# Patient Record
Sex: Female | Born: 1980
Health system: Southern US, Community
[De-identification: ages and names within clinical notes are randomized; demographics above are authoritative.]

## PROBLEM LIST (undated history)

## (undated) DIAGNOSIS — E039 Hypothyroidism, unspecified: Secondary | ICD-10-CM

## (undated) DIAGNOSIS — C801 Malignant (primary) neoplasm, unspecified: Secondary | ICD-10-CM

## (undated) DIAGNOSIS — R51 Headache: Secondary | ICD-10-CM

## (undated) DIAGNOSIS — F419 Anxiety disorder, unspecified: Secondary | ICD-10-CM

## (undated) DIAGNOSIS — Z9889 Other specified postprocedural states: Secondary | ICD-10-CM

## (undated) DIAGNOSIS — Z923 Personal history of irradiation: Secondary | ICD-10-CM

## (undated) DIAGNOSIS — Z8619 Personal history of other infectious and parasitic diseases: Secondary | ICD-10-CM

## (undated) DIAGNOSIS — R112 Nausea with vomiting, unspecified: Secondary | ICD-10-CM

## (undated) DIAGNOSIS — K219 Gastro-esophageal reflux disease without esophagitis: Secondary | ICD-10-CM

## (undated) HISTORY — DX: Personal history of other infectious and parasitic diseases: Z86.19

## (undated) HISTORY — PX: DILATION AND CURETTAGE OF UTERUS: SHX78

## (undated) HISTORY — PX: THYROIDECTOMY: SHX17

---

## 2006-10-11 DIAGNOSIS — C801 Malignant (primary) neoplasm, unspecified: Secondary | ICD-10-CM

## 2006-10-11 HISTORY — DX: Malignant (primary) neoplasm, unspecified: C80.1

## 2006-11-17 ENCOUNTER — Encounter: Admission: RE | Admit: 2006-11-17 | Discharge: 2006-11-17 | Payer: Self-pay | Admitting: Family Medicine

## 2006-11-29 ENCOUNTER — Other Ambulatory Visit: Admission: RE | Admit: 2006-11-29 | Discharge: 2006-11-29 | Payer: Self-pay | Admitting: Interventional Radiology

## 2006-11-29 ENCOUNTER — Encounter: Admission: RE | Admit: 2006-11-29 | Discharge: 2006-11-29 | Payer: Self-pay | Admitting: Family Medicine

## 2006-11-29 ENCOUNTER — Encounter (INDEPENDENT_AMBULATORY_CARE_PROVIDER_SITE_OTHER): Payer: Self-pay | Admitting: *Deleted

## 2006-12-16 ENCOUNTER — Ambulatory Visit (HOSPITAL_COMMUNITY): Admission: RE | Admit: 2006-12-16 | Discharge: 2006-12-17 | Payer: Self-pay | Admitting: Surgery

## 2006-12-16 ENCOUNTER — Encounter (INDEPENDENT_AMBULATORY_CARE_PROVIDER_SITE_OTHER): Payer: Self-pay | Admitting: Specialist

## 2007-01-06 ENCOUNTER — Encounter: Admission: RE | Admit: 2007-01-06 | Discharge: 2007-01-06 | Payer: Self-pay | Admitting: Endocrinology

## 2007-01-13 ENCOUNTER — Encounter: Admission: RE | Admit: 2007-01-13 | Discharge: 2007-01-13 | Payer: Self-pay | Admitting: Endocrinology

## 2007-01-20 ENCOUNTER — Encounter: Admission: RE | Admit: 2007-01-20 | Discharge: 2007-01-20 | Payer: Self-pay | Admitting: Endocrinology

## 2007-11-30 IMAGING — US US BIOPSY
1 series · 7 of 7 positions shown · non-contrast
Comparison: none

CLINICAL DATA: Dominant left asymptomatic thyroid nodule

ULTRASOUND GUIDED ASPIRATION BIOPSY THYROID:
TECHNIQUE: Overlying skin prepped with Betadine, draped in usual sterile
fashion, and infiltrated locally with 1% lidocaine. Under real-time ultrasound
guidance, 4 passes were made into the dominant solid left lower pole nodule
using 25 gauge needles. Samples were given to cytopathology. No immediate
complication.

[Series 1: us biopsy · 7 acquisitions, 7 frames shown]
[im 1/7]
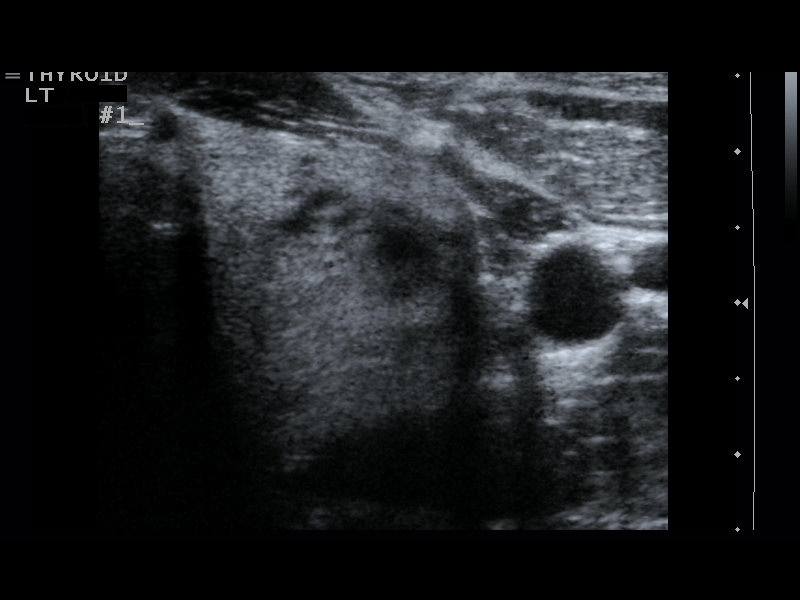
[im 2/7]
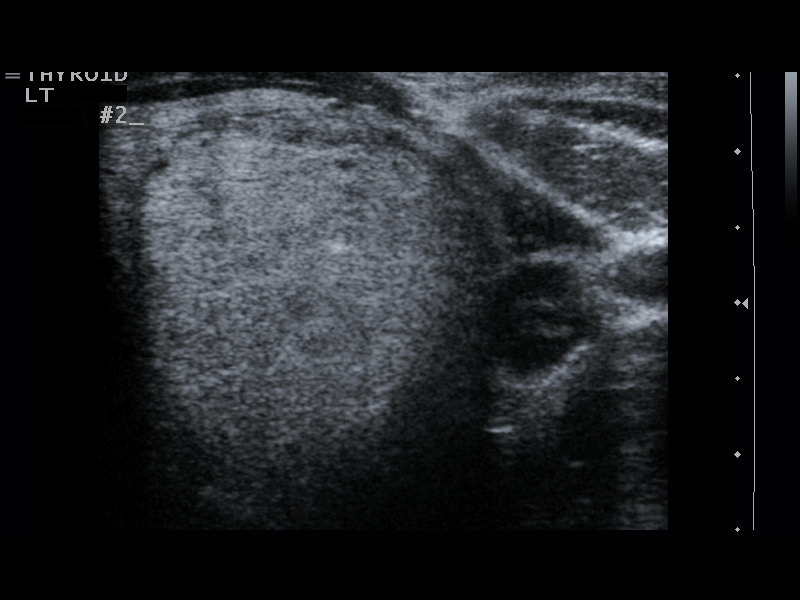
[im 3/7]
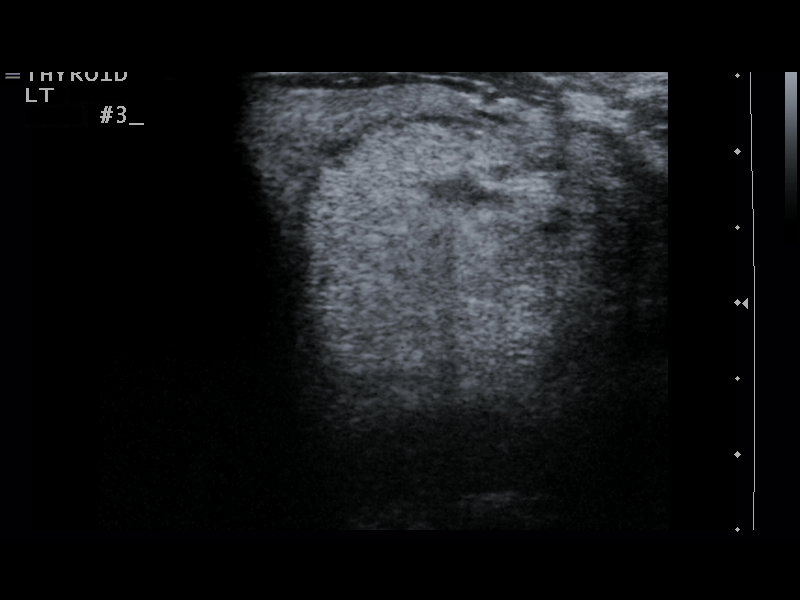
[im 4/7]
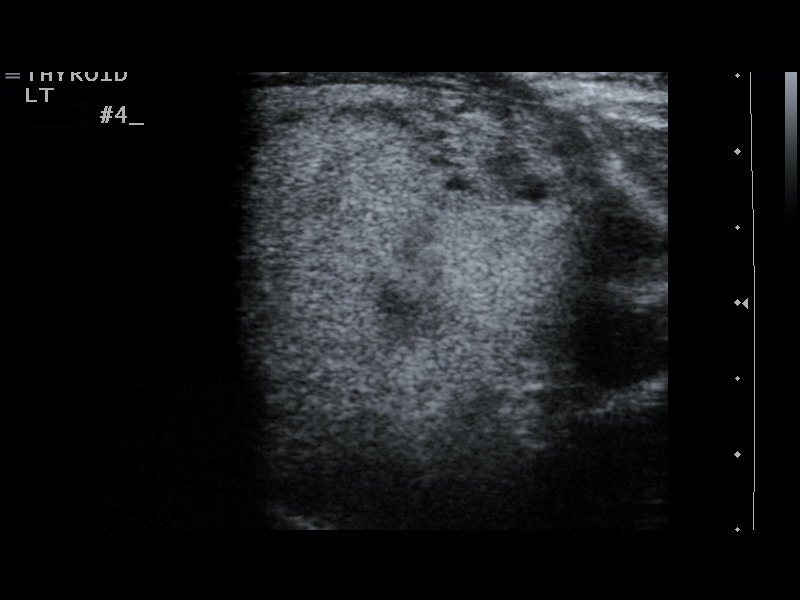
[im 5/7]
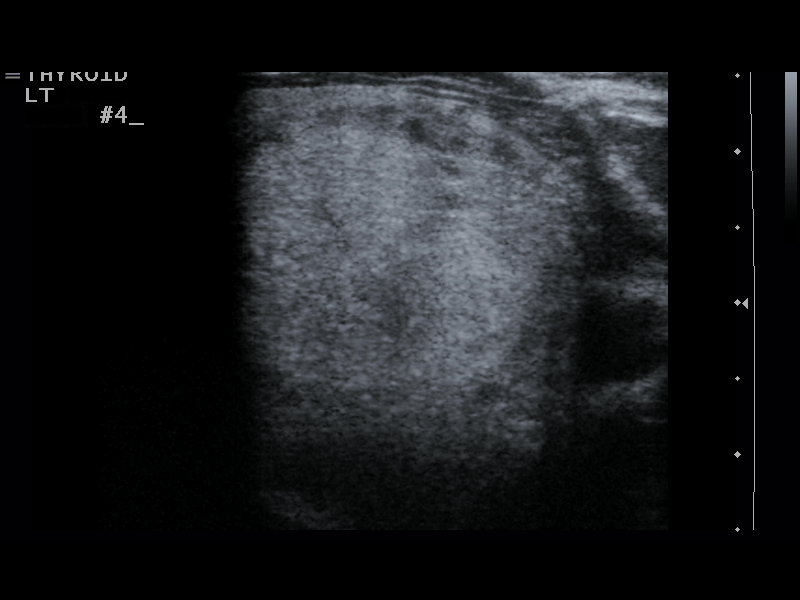
[im 6/7]
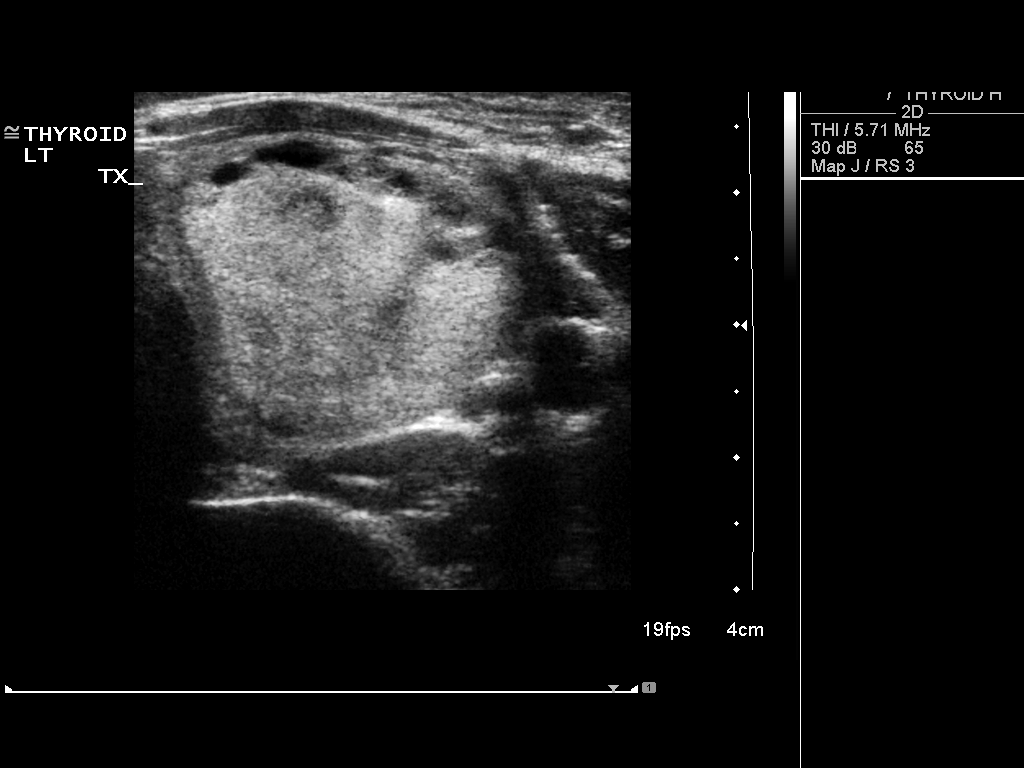
[im 7/7]
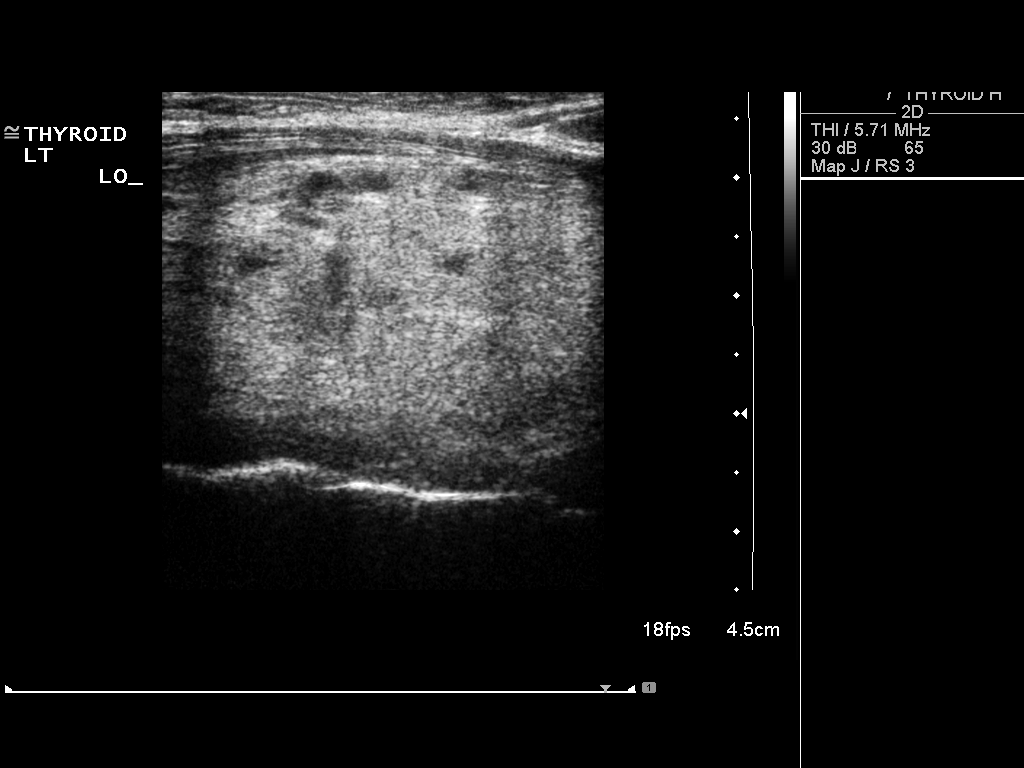

[7 of 7 positions shown; findings below may reference images not displayed]

IMPRESSION: Technically successful ultrasound guided FNA thyroid biopsy.

## 2007-12-15 IMAGING — CR DG CHEST 2V
2 series · 2 of 2 positions shown · non-contrast
Comparison: None.

CLINICAL DATA: 26 year-old-female with papillary thyroid cancer.  Preoperative respiratory exam. 
 CHEST - 2 VIEW:

[view not recorded (1 of 2)]
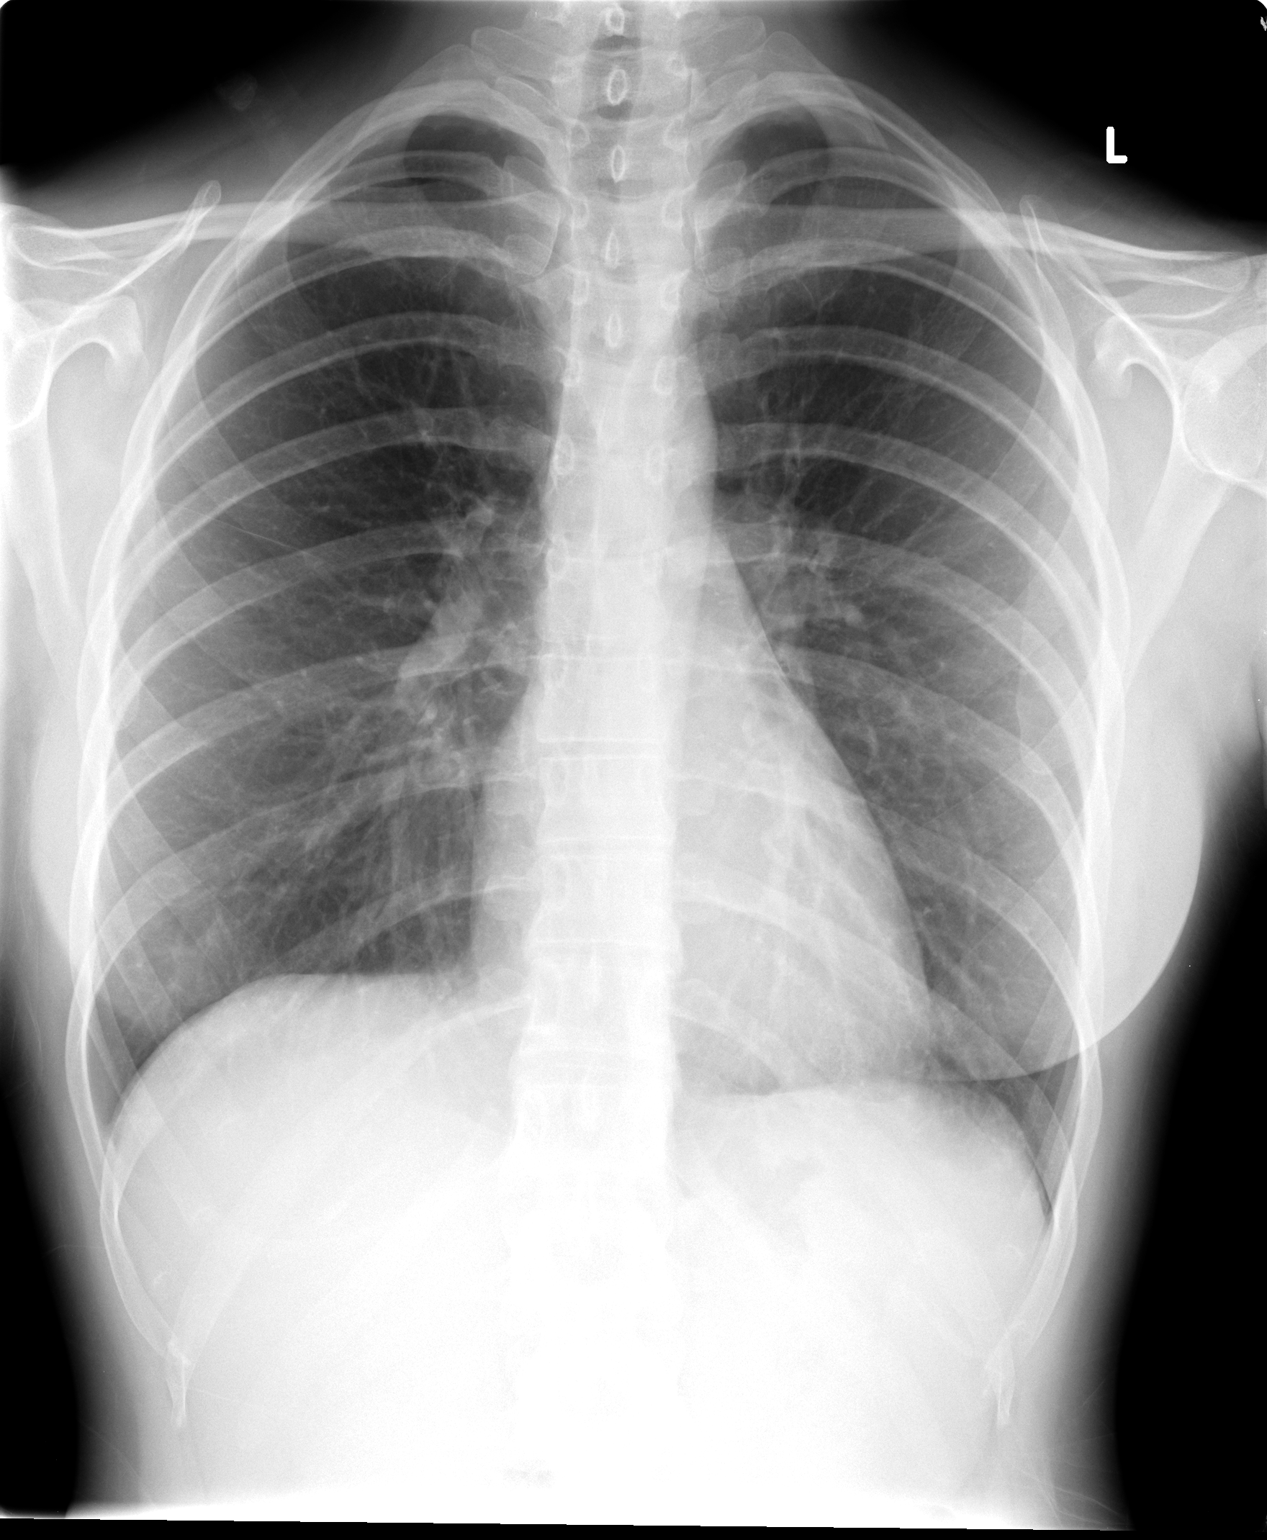

[view not recorded (2 of 2)]
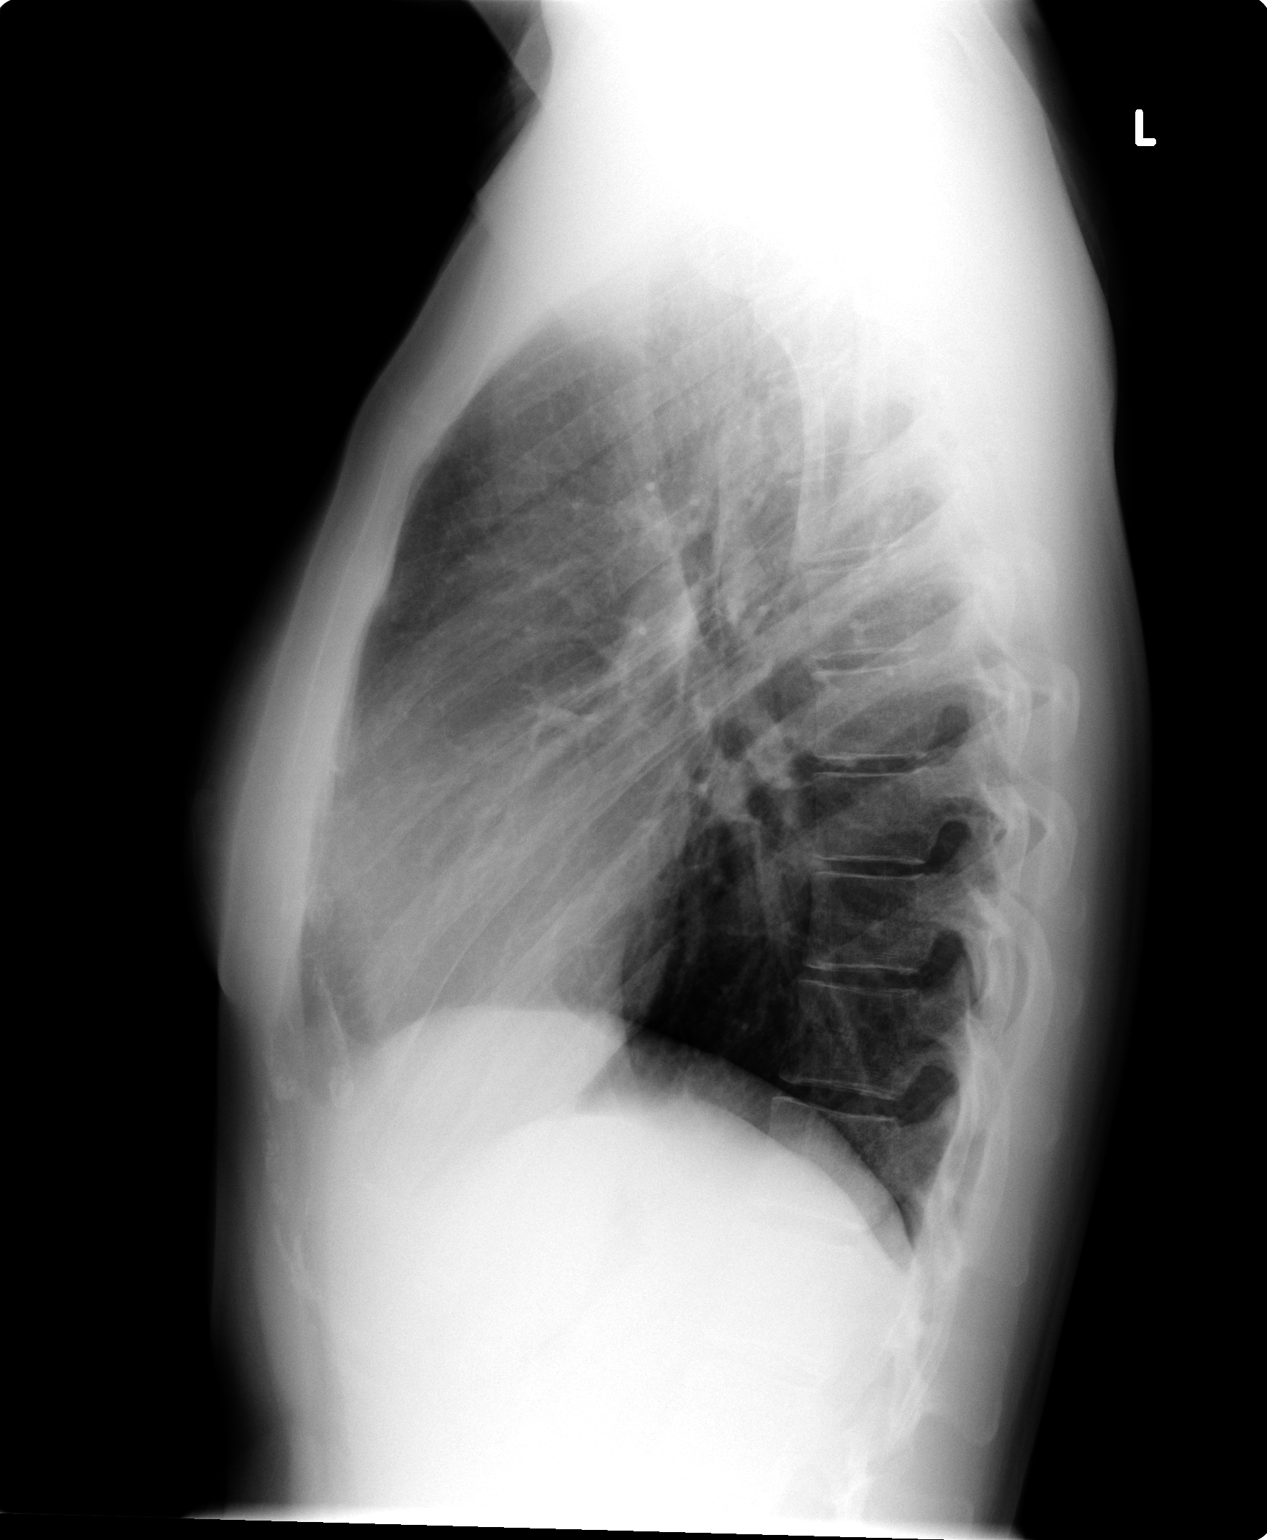

[2 of 2 positions shown; findings below may reference images not displayed]

FINDINGS: The heart size and mediastinal contours are within normal limits.  Both lungs are clear.  The visualized skeletal structures are unremarkable.
IMPRESSION: No active cardiopulmonary disease.

## 2008-05-31 ENCOUNTER — Ambulatory Visit (HOSPITAL_COMMUNITY): Admission: RE | Admit: 2008-05-31 | Discharge: 2008-05-31 | Payer: Self-pay | Admitting: Obstetrics and Gynecology

## 2008-06-21 ENCOUNTER — Ambulatory Visit (HOSPITAL_COMMUNITY): Admission: RE | Admit: 2008-06-21 | Discharge: 2008-06-21 | Payer: Self-pay | Admitting: Obstetrics and Gynecology

## 2008-08-26 ENCOUNTER — Ambulatory Visit (HOSPITAL_COMMUNITY): Admission: RE | Admit: 2008-08-26 | Discharge: 2008-08-26 | Payer: Self-pay | Admitting: Obstetrics and Gynecology

## 2008-10-22 ENCOUNTER — Inpatient Hospital Stay (HOSPITAL_COMMUNITY): Admission: AD | Admit: 2008-10-22 | Discharge: 2008-10-25 | Payer: Self-pay | Admitting: Obstetrics and Gynecology

## 2008-10-23 ENCOUNTER — Encounter (INDEPENDENT_AMBULATORY_CARE_PROVIDER_SITE_OTHER): Payer: Self-pay | Admitting: Obstetrics and Gynecology

## 2008-10-28 ENCOUNTER — Encounter: Admission: RE | Admit: 2008-10-28 | Discharge: 2008-11-27 | Payer: Self-pay | Admitting: Obstetrics and Gynecology

## 2008-11-28 ENCOUNTER — Encounter: Admission: RE | Admit: 2008-11-28 | Discharge: 2008-12-25 | Payer: Self-pay | Admitting: Obstetrics and Gynecology

## 2008-12-26 ENCOUNTER — Encounter: Admission: RE | Admit: 2008-12-26 | Discharge: 2009-01-25 | Payer: Self-pay | Admitting: Obstetrics and Gynecology

## 2009-01-26 ENCOUNTER — Encounter: Admission: RE | Admit: 2009-01-26 | Discharge: 2009-02-24 | Payer: Self-pay | Admitting: Obstetrics and Gynecology

## 2009-02-25 ENCOUNTER — Encounter: Admission: RE | Admit: 2009-02-25 | Discharge: 2009-03-27 | Payer: Self-pay | Admitting: Obstetrics and Gynecology

## 2009-03-28 ENCOUNTER — Encounter: Admission: RE | Admit: 2009-03-28 | Discharge: 2009-04-17 | Payer: Self-pay | Admitting: Obstetrics and Gynecology

## 2009-05-19 ENCOUNTER — Encounter (HOSPITAL_COMMUNITY): Admission: RE | Admit: 2009-05-19 | Discharge: 2009-07-15 | Payer: Self-pay | Admitting: Endocrinology

## 2010-11-02 ENCOUNTER — Encounter (HOSPITAL_COMMUNITY): Payer: Self-pay | Admitting: Obstetrics and Gynecology

## 2010-11-11 ENCOUNTER — Other Ambulatory Visit (HOSPITAL_COMMUNITY): Payer: Self-pay | Admitting: Endocrinology

## 2010-11-11 DIAGNOSIS — C73 Malignant neoplasm of thyroid gland: Secondary | ICD-10-CM

## 2010-11-30 ENCOUNTER — Other Ambulatory Visit (HOSPITAL_COMMUNITY): Payer: Self-pay | Admitting: Endocrinology

## 2010-11-30 ENCOUNTER — Inpatient Hospital Stay (HOSPITAL_COMMUNITY): Admission: RE | Admit: 2010-11-30 | Payer: Self-pay | Source: Ambulatory Visit

## 2010-11-30 DIAGNOSIS — Z85850 Personal history of malignant neoplasm of thyroid: Secondary | ICD-10-CM

## 2010-12-01 ENCOUNTER — Ambulatory Visit (HOSPITAL_COMMUNITY): Payer: Self-pay

## 2010-12-02 ENCOUNTER — Ambulatory Visit (HOSPITAL_COMMUNITY): Payer: Self-pay

## 2010-12-04 ENCOUNTER — Encounter (HOSPITAL_COMMUNITY): Payer: Self-pay

## 2010-12-28 ENCOUNTER — Encounter (HOSPITAL_COMMUNITY)
Admission: RE | Admit: 2010-12-28 | Discharge: 2010-12-28 | Disposition: A | Discharge status: Home / Self Care | Payer: 59 | Source: Ambulatory Visit | Attending: Endocrinology | Admitting: Endocrinology

## 2010-12-28 ENCOUNTER — Ambulatory Visit (HOSPITAL_COMMUNITY): Payer: Self-pay

## 2010-12-28 DIAGNOSIS — C73 Malignant neoplasm of thyroid gland: Secondary | ICD-10-CM | POA: Insufficient documentation

## 2010-12-28 DIAGNOSIS — Z85850 Personal history of malignant neoplasm of thyroid: Secondary | ICD-10-CM

## 2010-12-29 ENCOUNTER — Ambulatory Visit (HOSPITAL_COMMUNITY): Payer: 59 | Attending: Endocrinology

## 2010-12-29 ENCOUNTER — Ambulatory Visit (HOSPITAL_COMMUNITY): Payer: Self-pay

## 2010-12-30 ENCOUNTER — Ambulatory Visit (HOSPITAL_COMMUNITY): Payer: 59 | Attending: Endocrinology

## 2010-12-30 ENCOUNTER — Ambulatory Visit (HOSPITAL_COMMUNITY): Payer: Self-pay

## 2010-12-30 LAB — HCG, SERUM, QUALITATIVE: Preg, Serum: NEGATIVE

## 2011-01-01 ENCOUNTER — Encounter (HOSPITAL_COMMUNITY)
Admission: RE | Admit: 2011-01-01 | Discharge: 2011-01-01 | Disposition: A | Discharge status: Home / Self Care | Payer: 59 | Source: Ambulatory Visit | Attending: Endocrinology | Admitting: Endocrinology

## 2011-01-01 ENCOUNTER — Encounter (HOSPITAL_COMMUNITY): Payer: Self-pay

## 2011-01-01 DIAGNOSIS — C73 Malignant neoplasm of thyroid gland: Secondary | ICD-10-CM | POA: Insufficient documentation

## 2011-01-01 MED ORDER — SODIUM IODIDE I 131 CAPSULE
4.0000 | Freq: Once | INTRAVENOUS | Status: AC | PRN
  Administered 2011-01-01: 4 via ORAL

## 2011-01-25 LAB — CBC
HCT: 36.3 % (ref 36.0–46.0)
Hemoglobin: 12.1 g/dL (ref 12.0–15.0)
MCHC: 33.4 g/dL (ref 30.0–36.0)
MCHC: 34 g/dL (ref 30.0–36.0)
MCV: 91.6 fL (ref 78.0–100.0)
MCV: 92.5 fL (ref 78.0–100.0)
Platelets: 153 10*3/uL (ref 150–400)
RBC: 3.34 MIL/uL — ABNORMAL LOW (ref 3.87–5.11)
RBC: 3.96 MIL/uL (ref 3.87–5.11)
RDW: 13.3 % (ref 11.5–15.5)
RDW: 13.3 % (ref 11.5–15.5)

## 2011-02-23 NOTE — Discharge Summary (Signed)
NAMEJERMIA, Phyllis Harris NO.:  [id number]   MEDICAL RECORD NO.:  [id number]          PATIENT TYPE:  INP   LOCATION:  9121                          FACILITY:  [facility]   PHYSICIAN:  Dineen Kid. Rana Snare, M.D.    DATE OF BIRTH:  1980-10-24   DATE OF ADMISSION:  10/22/2008  DATE OF DISCHARGE:  10/25/2008                               DISCHARGE SUMMARY   ADMITTING DIAGNOSES:  1. Intrauterine pregnancy at 37 weeks, estimated gestational age.  2. Large for gestational age, baby.  3. Spontaneous rupture of membranes.   DISCHARGE DIAGNOSES:  1. Status post low-transverse cesarean section.  2. Viable female infant.   PROCEDURE:  Low-transverse cesarean section.   REASON FOR ADMISSION:  Please see written H&P.   HOSPITAL COURSE:  The patient is 30 year old white married female  primigravida was admitted to [facility] at 37 weeks  estimated gestational age with spontaneous rupture of membranes.  The  patient had had an ultrasound in the office 2 days prior to admission  with an estimated fetal weight of 11 pounds 4 ounces on the ultrasound.  The patient had undergone a 3-hour glucose tolerance test which was  within normal limits.  Due to estimated fetal weight of greater than  4000 g, decision was made to proceed with a primary low-transverse  cesarean section.  The patient was then transferred to the operating  room, where spinal anesthesia was administered without difficulty.  Low  transverse incision was made with delivery of a viable female infant  weighing 10 pounds 10 ounces, Apgars of 8 at 1 minute and 9 at 5  minutes.  The patient tolerated the procedure well and was taken to the  recovery room in stable condition.  Later that morning, the patient was  transferred to mother baby unit where she was without complaint.  Vital  signs were stable.  She is afebrile.  Abdomen is soft with good return  of bowel function.  Fundus was firm and nontender.  Abdominal  dressings  noted to be clean, dry, and intact.  Foley was draining with adequate  amount of urine output.  On postoperative day #1, the patient was  without complaint.  Vital signs were stable.  She was afebrile.  Fundus  was firm and nontender.  Abdominal dressing was clean, dry, and intact.  The patient's Foley has been discontinued and she was voiding well.   LABORATORY FINDINGS:  Revealed hemoglobin of 10.5, platelet count of  137,000, WBC count of 12.8.  Blood type was known to be B+.  On  postoperative day #2, the patient did desire early discharge.  Fundus  was firm and nontender.  Incision was clean, dry, and intact.  The  staples removed and the patient was later discharged home.   CONDITION ON DISCHARGE:  Good.   DIET:  Regular as tolerated.   ACTIVITY:  No heavy lifting, no driving x2 weeks, no vaginal entry.   FOLLOW UP:  The patient to follow up in the office in 1-2 weeks for  incision check.  She is to call for temperature greater  than 100 degrees  persistent nausea, vomiting, heavy vaginal bleeding, and/or redness, or  drainage from the incisional site.   DISCHARGE MEDICATIONS:  1. Tylox #30 1 p.o. every 4-6 hours p.r.n.  2. Motrin 600 mg every 6 hours.  3. Prenatal vitamins 1 p.o. daily.  4. Colace 1 p.o. daily p.r.n.      Julio Sicks, N.P.      Dineen Kid Rana Snare, M.D.  Electronically Signed    CC/MEDQ  D:  11/18/2008  T:  11/18/2008  Job:  [id number]

## 2011-02-23 NOTE — Op Note (Signed)
[name], Harris NO.:  [id number]   MEDICAL RECORD NO.:  [id number]          PATIENT TYPE:  INP   LOCATION:  9121                          FACILITY:  [facility]   PHYSICIAN:  Guy Sandifer. Henderson Cloud, M.D. DATE OF BIRTH:  03/08/1981   DATE OF PROCEDURE:  10/23/2008  DATE OF DISCHARGE:  08/26/2008                               OPERATIVE REPORT   PREOPERATIVE DIAGNOSES:  1. Intrauterine pregnancy at 21 and 0/7th weeks.  2. Large for gestational age baby.  3. Spontaneous rupture of membranes.   POSTOPERATIVE DIAGNOSES:  1. Intrauterine pregnancy at 55 and 0/7th weeks.  2. Large for gestational age baby.  3. Spontaneous rupture of membranes.   PROCEDURE:  Low-transverse cesarean section.   SUBJECTIVE:  Guy Sandifer. Henderson Cloud, MD   ANESTHESIA:  Spinal, Tyrone Apple. Malen Gauze, MD   ESTIMATED BLOOD LOSS:  500 mL.   SPECIMENS:  Placenta to Pathology.   FINDINGS:  Viable female infant, Apgars of 8 and 9 at one and five  minutes respectively, birth weight 10 pounds 10 ounces.   INDICATIONS AND CONSENT:  This patient is a 30 year old married white  female G1, P0 at 83 and 0/7th weeks who 2 days ago had an estimated  fetal weight of approximately 11 pounds 4 ounces on ultrasound.  Her 3-  hour GTT was normal.  Today, she has spontaneous rupture of membranes  with clear fluid.  Cesarean section was recommended.  Potential risks  and complications were discussed preoperatively including, but not  limited to infection, organ damage, bleeding requiring transfusion of  blood products with HIV and hepatitis acquisition, DVT, PE, pneumonia.  All questions were answered, and consent was signed on the chart.   PROCEDURE:  The patient was taken to the operating room, where she was  identified, spinal anesthetic was placed, and she was placed in a dorsal  supine position with a 15-degree left lateral wedge.  She was prepped,  Foley catheter was placed, the bladder was drained, and she was  draped  in sterile fashion.  After testing for adequate spinal anesthesia, skin  was entered through a Pfannenstiel incision, and dissection was carried  out in the layers to the peritoneum.  Peritoneum was incised, extended  superiorly and inferiorly.  Vesicouterine peritoneum was taken down  cephalad laterally.  The bladder flap was developed and the bladder  blade was placed.  The uterus was incised in a low-transverse manner.  The uterine cavity was entered bluntly.  The uterine incision was  extended cephalad laterally with fingers.  The vertex was then delivered  and the oral and nasopharynx were suctioned.  The remainder of the baby  was delivered.  Good cry and tone was noted.  Cord was clamped and cut.  The baby was handed to the awaiting pediatrics team.  The placenta was  manually delivered and sent to Pathology.  Uterine cavity was cleaned.  Uterus was closed in 2 running locking imbricating layers of 0 Monocryl,  which achieved good hemostasis.  Tubes and ovaries were normal  bilaterally.  Irrigation was carried out.  Anterior peritoneum was  closed with running fashion with 0 Monocryl, which was also used to  reapproximate the pyramidalis muscles in the midline.  Anterior rectus  fascia was closed in running fashion with the double loop 0 PDS suture.  Skin is closed with clips.  All sponge, instruments, and needle counts  were correct, and the patient was transferred to the recovery room in  stable condition.      Guy Sandifer Henderson Cloud, M.D.  Electronically Signed     JET/MEDQ  D:  10/23/2008  T:  10/23/2008  Job:  [id number]

## 2013-04-19 ENCOUNTER — Inpatient Hospital Stay (HOSPITAL_COMMUNITY): Admission: AD | Admit: 2013-04-19 | Payer: 59 | Source: Ambulatory Visit | Admitting: Obstetrics and Gynecology

## 2013-05-04 ENCOUNTER — Other Ambulatory Visit: Payer: Self-pay | Admitting: Obstetrics and Gynecology

## 2013-10-23 LAB — OB RESULTS CONSOLE ABO/RH: RH TYPE: POSITIVE

## 2013-10-23 LAB — OB RESULTS CONSOLE HEPATITIS B SURFACE ANTIGEN: Hepatitis B Surface Ag: NEGATIVE

## 2013-10-23 LAB — OB RESULTS CONSOLE ANTIBODY SCREEN: ANTIBODY SCREEN: NEGATIVE

## 2013-10-23 LAB — OB RESULTS CONSOLE RUBELLA ANTIBODY, IGM: RUBELLA: IMMUNE

## 2013-10-23 LAB — OB RESULTS CONSOLE GC/CHLAMYDIA
Chlamydia: NEGATIVE
Gonorrhea: NEGATIVE

## 2013-10-23 LAB — OB RESULTS CONSOLE HIV ANTIBODY (ROUTINE TESTING): HIV: NONREACTIVE

## 2013-10-23 LAB — OB RESULTS CONSOLE RPR: RPR: NONREACTIVE

## 2013-11-19 ENCOUNTER — Other Ambulatory Visit (HOSPITAL_COMMUNITY): Payer: Self-pay | Admitting: Obstetrics and Gynecology

## 2013-11-20 ENCOUNTER — Other Ambulatory Visit (HOSPITAL_COMMUNITY): Payer: Self-pay | Admitting: Obstetrics and Gynecology

## 2013-11-20 ENCOUNTER — Encounter (HOSPITAL_COMMUNITY): Payer: Self-pay | Admitting: Obstetrics and Gynecology

## 2013-11-20 DIAGNOSIS — Z3689 Encounter for other specified antenatal screening: Secondary | ICD-10-CM

## 2013-11-20 DIAGNOSIS — O09529 Supervision of elderly multigravida, unspecified trimester: Secondary | ICD-10-CM

## 2013-11-20 DIAGNOSIS — O280 Abnormal hematological finding on antenatal screening of mother: Secondary | ICD-10-CM

## 2013-11-28 ENCOUNTER — Ambulatory Visit (HOSPITAL_COMMUNITY): Payer: 59

## 2013-12-04 ENCOUNTER — Ambulatory Visit (HOSPITAL_COMMUNITY): Payer: 59

## 2013-12-04 ENCOUNTER — Encounter (HOSPITAL_COMMUNITY): Payer: 59

## 2013-12-07 ENCOUNTER — Other Ambulatory Visit (HOSPITAL_COMMUNITY): Payer: 59

## 2014-04-30 ENCOUNTER — Encounter (HOSPITAL_COMMUNITY): Payer: Self-pay | Admitting: Pharmacy Technician

## 2014-05-10 ENCOUNTER — Encounter (HOSPITAL_COMMUNITY): Payer: Self-pay

## 2014-05-10 NOTE — Patient Instructions (Signed)
Your procedure is scheduled on: Wednesday, May 15, 2014  Enter through the [employer] [facility] at: 11:30 AM  Pick up the phone at the desk and dial [phone].  Call this number if you have problems the morning of surgery: [phone].  Remember: Do NOT eat food: AFTER MIDNIGHT TUESDAY Do NOT drink clear liquids after: AFTER 9:00 AM Wednesday MORNING Take these medicines the morning of surgery with a SIP OF WATER:  SYNTHROID, ZANTAC  Do NOT wear jewelry (body piercing), metal hair clips/bobby pins, make-up, or nail polish. Do NOT wear lotions, powders, or perfumes.  You may wear deoderant. Do NOT shave for 48 hours prior to surgery. Do NOT bring valuables to the hospital. Contacts, dentures, or bridgework may not be worn into surgery. Leave suitcase in car.  After surgery it may be brought to your room.  For patients admitted to the hospital, checkout time is 11:00 AM the day of discharge.

## 2014-05-13 ENCOUNTER — Encounter (HOSPITAL_COMMUNITY): Payer: Self-pay

## 2014-05-13 ENCOUNTER — Encounter (HOSPITAL_COMMUNITY)
Admission: RE | Admit: 2014-05-13 | Discharge: 2014-05-13 | Disposition: A | Discharge status: Home / Self Care | Payer: 59 | Source: Ambulatory Visit | Attending: Obstetrics and Gynecology | Admitting: Obstetrics and Gynecology

## 2014-05-13 HISTORY — DX: Gastro-esophageal reflux disease without esophagitis: K21.9

## 2014-05-13 HISTORY — DX: Hypothyroidism, unspecified: E03.9

## 2014-05-13 HISTORY — DX: Malignant (primary) neoplasm, unspecified: C80.1

## 2014-05-13 HISTORY — DX: Headache: R51

## 2014-05-13 LAB — CBC
HEMATOCRIT: 38 % (ref 36.0–46.0)
Hemoglobin: 13.1 g/dL (ref 12.0–15.0)
MCH: 30.5 pg (ref 26.0–34.0)
MCHC: 34.5 g/dL (ref 30.0–36.0)
MCV: 88.6 fL (ref 78.0–100.0)
PLATELETS: 136 10*3/uL — AB (ref 150–400)
RBC: 4.29 MIL/uL (ref 3.87–5.11)
RDW: 13.7 % (ref 11.5–15.5)
WBC: 11.4 10*3/uL — AB (ref 4.0–10.5)

## 2014-05-13 LAB — BASIC METABOLIC PANEL
ANION GAP: 13 (ref 5–15)
BUN: 9 mg/dL (ref 6–23)
CHLORIDE: 101 meq/L (ref 96–112)
CO2: 23 mEq/L (ref 19–32)
CREATININE: 0.63 mg/dL (ref 0.50–1.10)
Calcium: 9 mg/dL (ref 8.4–10.5)
GFR calc Af Amer: >90 mL/min (ref >90)
Glucose, Bld: 133 mg/dL — ABNORMAL HIGH (ref 70–99)
POTASSIUM: 5 meq/L (ref 3.7–5.3)
Sodium: 137 mEq/L (ref 137–147)

## 2014-05-13 LAB — ABO/RH: ABO/RH(D): B POS

## 2014-05-13 LAB — RPR

## 2014-05-14 ENCOUNTER — Inpatient Hospital Stay (HOSPITAL_COMMUNITY): Payer: 59 | Admitting: Anesthesiology

## 2014-05-14 ENCOUNTER — Encounter (HOSPITAL_COMMUNITY): Payer: 59 | Admitting: Anesthesiology

## 2014-05-14 ENCOUNTER — Inpatient Hospital Stay (HOSPITAL_COMMUNITY)
Admission: AD | Admit: 2014-05-14 | Discharge: 2014-05-16 | DRG: 766 | Disposition: A | Discharge status: Home / Self Care | Payer: 59 | Source: Ambulatory Visit | Attending: Obstetrics and Gynecology | Admitting: Obstetrics and Gynecology

## 2014-05-14 ENCOUNTER — Encounter (HOSPITAL_COMMUNITY): Payer: Self-pay | Admitting: *Deleted

## 2014-05-14 ENCOUNTER — Encounter (HOSPITAL_COMMUNITY)
Admission: AD | Disposition: A | Discharge status: Home / Self Care | Payer: Self-pay | Source: Ambulatory Visit | Attending: Obstetrics and Gynecology

## 2014-05-14 DIAGNOSIS — O99284 Endocrine, nutritional and metabolic diseases complicating childbirth: Secondary | ICD-10-CM

## 2014-05-14 DIAGNOSIS — O09529 Supervision of elderly multigravida, unspecified trimester: Secondary | ICD-10-CM | POA: Diagnosis present

## 2014-05-14 DIAGNOSIS — O34219 Maternal care for unspecified type scar from previous cesarean delivery: Principal | ICD-10-CM | POA: Diagnosis present

## 2014-05-14 DIAGNOSIS — E039 Hypothyroidism, unspecified: Secondary | ICD-10-CM | POA: Diagnosis present

## 2014-05-14 DIAGNOSIS — K219 Gastro-esophageal reflux disease without esophagitis: Secondary | ICD-10-CM | POA: Diagnosis present

## 2014-05-14 DIAGNOSIS — O3660X0 Maternal care for excessive fetal growth, unspecified trimester, not applicable or unspecified: Secondary | ICD-10-CM | POA: Diagnosis present

## 2014-05-14 DIAGNOSIS — E079 Disorder of thyroid, unspecified: Secondary | ICD-10-CM | POA: Diagnosis present

## 2014-05-14 DIAGNOSIS — Z98891 History of uterine scar from previous surgery: Secondary | ICD-10-CM

## 2014-05-14 DIAGNOSIS — O479 False labor, unspecified: Secondary | ICD-10-CM | POA: Diagnosis present

## 2014-05-14 LAB — CBC
HEMATOCRIT: 38.1 % (ref 36.0–46.0)
HEMOGLOBIN: 13 g/dL (ref 12.0–15.0)
MCH: 30.1 pg (ref 26.0–34.0)
MCHC: 34.1 g/dL (ref 30.0–36.0)
MCV: 88.2 fL (ref 78.0–100.0)
Platelets: 128 10*3/uL — ABNORMAL LOW (ref 150–400)
RBC: 4.32 MIL/uL (ref 3.87–5.11)
RDW: 13.8 % (ref 11.5–15.5)
WBC: 12.1 10*3/uL — AB (ref 4.0–10.5)

## 2014-05-14 LAB — PREPARE RBC (CROSSMATCH)

## 2014-05-14 SURGERY — Surgical Case
Anesthesia: Spinal

## 2014-05-14 MED ORDER — SIMETHICONE 80 MG PO CHEW
80.0000 mg | CHEWABLE_TABLET | ORAL | Status: DC
  Administered 2014-05-15 (×2): 80 mg via ORAL
  Filled 2014-05-14 (×2): qty 1

## 2014-05-14 MED ORDER — SIMETHICONE 80 MG PO CHEW
80.0000 mg | CHEWABLE_TABLET | Freq: Three times a day (TID) | ORAL | Status: DC
  Administered 2014-05-14 – 2014-05-16 (×5): 80 mg via ORAL
  Filled 2014-05-14 (×5): qty 1

## 2014-05-14 MED ORDER — MORPHINE SULFATE 0.5 MG/ML IJ SOLN
INTRAMUSCULAR | Status: AC
  Filled 2014-05-14: qty 10

## 2014-05-14 MED ORDER — ZOLPIDEM TARTRATE 5 MG PO TABS: 5.0000 mg | ORAL_TABLET | Freq: Every evening | ORAL | Status: DC | PRN

## 2014-05-14 MED ORDER — TETANUS-DIPHTH-ACELL PERTUSSIS 5-2.5-18.5 LF-MCG/0.5 IM SUSP: 0.5000 mL | Freq: Once | INTRAMUSCULAR | Status: DC

## 2014-05-14 MED ORDER — NALOXONE HCL 0.4 MG/ML IJ SOLN: 0.4000 mg | INTRAMUSCULAR | Status: DC | PRN

## 2014-05-14 MED ORDER — DEXTROSE 5 % IV SOLN
1.0000 ug/kg/h | INTRAVENOUS | Status: DC | PRN
  Filled 2014-05-14: qty 2

## 2014-05-14 MED ORDER — MEPERIDINE HCL 25 MG/ML IJ SOLN: 6.2500 mg | INTRAMUSCULAR | Status: DC | PRN

## 2014-05-14 MED ORDER — CITRIC ACID-SODIUM CITRATE 334-500 MG/5ML PO SOLN
30.0000 mL | Freq: Once | ORAL | Status: AC
  Administered 2014-05-14: 30 mL via ORAL
  Filled 2014-05-14: qty 15

## 2014-05-14 MED ORDER — LACTATED RINGERS IV SOLN: INTRAVENOUS | Status: DC

## 2014-05-14 MED ORDER — MENTHOL 3 MG MT LOZG: 1.0000 | LOZENGE | OROMUCOSAL | Status: DC | PRN

## 2014-05-14 MED ORDER — METOCLOPRAMIDE HCL 5 MG/ML IJ SOLN: 10.0000 mg | Freq: Three times a day (TID) | INTRAMUSCULAR | Status: DC | PRN

## 2014-05-14 MED ORDER — IBUPROFEN 600 MG PO TABS: 600.0000 mg | ORAL_TABLET | Freq: Four times a day (QID) | ORAL | Status: DC | PRN

## 2014-05-14 MED ORDER — LANOLIN HYDROUS EX OINT: 1.0000 "application " | TOPICAL_OINTMENT | CUTANEOUS | Status: DC | PRN

## 2014-05-14 MED ORDER — LEVOTHYROXINE SODIUM 200 MCG PO TABS
200.0000 ug | ORAL_TABLET | Freq: Every day | ORAL | Status: DC
  Administered 2014-05-16: 200 ug via ORAL
  Filled 2014-05-14 (×2): qty 1

## 2014-05-14 MED ORDER — DIBUCAINE 1 % RE OINT: 1.0000 "application " | TOPICAL_OINTMENT | RECTAL | Status: DC | PRN

## 2014-05-14 MED ORDER — DIPHENHYDRAMINE HCL 50 MG/ML IJ SOLN: 25.0000 mg | INTRAMUSCULAR | Status: DC | PRN

## 2014-05-14 MED ORDER — DIPHENHYDRAMINE HCL 25 MG PO CAPS: 25.0000 mg | ORAL_CAPSULE | Freq: Four times a day (QID) | ORAL | Status: DC | PRN

## 2014-05-14 MED ORDER — ONDANSETRON HCL 4 MG PO TABS: 4.0000 mg | ORAL_TABLET | ORAL | Status: DC | PRN

## 2014-05-14 MED ORDER — DEXTROSE 5 % IV SOLN: 3.0000 g | INTRAVENOUS | Status: DC

## 2014-05-14 MED ORDER — PHENYLEPHRINE 8 MG IN D5W 100 ML (0.08MG/ML) PREMIX OPTIME
INJECTION | INTRAVENOUS | Status: AC
  Filled 2014-05-14: qty 100

## 2014-05-14 MED ORDER — CEFAZOLIN SODIUM-DEXTROSE 2-3 GM-% IV SOLR
2.0000 g | INTRAVENOUS | Status: DC
  Filled 2014-05-14: qty 50

## 2014-05-14 MED ORDER — MORPHINE SULFATE (PF) 0.5 MG/ML IJ SOLN
INTRAMUSCULAR | Status: DC | PRN
  Administered 2014-05-14: .15 mg via INTRATHECAL

## 2014-05-14 MED ORDER — PRENATAL MULTIVITAMIN CH
1.0000 | ORAL_TABLET | Freq: Every day | ORAL | Status: DC
  Administered 2014-05-15: 1 via ORAL
  Filled 2014-05-14 (×2): qty 1

## 2014-05-14 MED ORDER — ONDANSETRON HCL 4 MG/2ML IJ SOLN
INTRAMUSCULAR | Status: DC | PRN
  Administered 2014-05-14: 4 mg via INTRAVENOUS

## 2014-05-14 MED ORDER — OXYTOCIN 40 UNITS IN LACTATED RINGERS INFUSION - SIMPLE MED: 62.5000 mL/h | INTRAVENOUS | Status: AC

## 2014-05-14 MED ORDER — MIDAZOLAM HCL 2 MG/2ML IJ SOLN: 0.5000 mg | Freq: Once | INTRAMUSCULAR | Status: DC | PRN

## 2014-05-14 MED ORDER — LACTATED RINGERS IV SOLN
INTRAVENOUS | Status: DC
  Administered 2014-05-14: 09:00:00 via INTRAVENOUS

## 2014-05-14 MED ORDER — BUPIVACAINE IN DEXTROSE 0.75-8.25 % IT SOLN
INTRATHECAL | Status: DC | PRN
  Administered 2014-05-14: 1.6 mL via INTRATHECAL

## 2014-05-14 MED ORDER — NALBUPHINE HCL 10 MG/ML IJ SOLN: 5.0000 mg | INTRAMUSCULAR | Status: DC | PRN

## 2014-05-14 MED ORDER — FAMOTIDINE IN NACL 20-0.9 MG/50ML-% IV SOLN
20.0000 mg | Freq: Once | INTRAVENOUS | Status: AC
  Administered 2014-05-14: 20 mg via INTRAVENOUS
  Filled 2014-05-14: qty 50

## 2014-05-14 MED ORDER — OXYTOCIN 10 UNIT/ML IJ SOLN
INTRAMUSCULAR | Status: AC
  Filled 2014-05-14: qty 4

## 2014-05-14 MED ORDER — SODIUM CHLORIDE 0.9 % IJ SOLN: 3.0000 mL | INTRAMUSCULAR | Status: DC | PRN

## 2014-05-14 MED ORDER — ONDANSETRON HCL 4 MG/2ML IJ SOLN
INTRAMUSCULAR | Status: AC
  Filled 2014-05-14: qty 2

## 2014-05-14 MED ORDER — OXYCODONE-ACETAMINOPHEN 5-325 MG PO TABS: 1.0000 | ORAL_TABLET | ORAL | Status: DC | PRN

## 2014-05-14 MED ORDER — KETOROLAC TROMETHAMINE 30 MG/ML IJ SOLN
30.0000 mg | Freq: Four times a day (QID) | INTRAMUSCULAR | Status: DC | PRN
  Administered 2014-05-14: 30 mg via INTRAVENOUS

## 2014-05-14 MED ORDER — SCOPOLAMINE 1 MG/3DAYS TD PT72
1.0000 | MEDICATED_PATCH | Freq: Once | TRANSDERMAL | Status: DC
  Administered 2014-05-14: 1.5 mg via TRANSDERMAL

## 2014-05-14 MED ORDER — ONDANSETRON HCL 4 MG/2ML IJ SOLN: 4.0000 mg | Freq: Three times a day (TID) | INTRAMUSCULAR | Status: DC | PRN

## 2014-05-14 MED ORDER — IBUPROFEN 600 MG PO TABS
600.0000 mg | ORAL_TABLET | Freq: Four times a day (QID) | ORAL | Status: DC
  Administered 2014-05-14 – 2014-05-16 (×6): 600 mg via ORAL
  Filled 2014-05-14 (×7): qty 1

## 2014-05-14 MED ORDER — KETOROLAC TROMETHAMINE 30 MG/ML IJ SOLN: 30.0000 mg | Freq: Four times a day (QID) | INTRAMUSCULAR | Status: DC | PRN

## 2014-05-14 MED ORDER — LACTATED RINGERS IV SOLN
INTRAVENOUS | Status: DC | PRN
  Administered 2014-05-14 (×3): via INTRAVENOUS

## 2014-05-14 MED ORDER — OXYTOCIN 10 UNIT/ML IJ SOLN
40.0000 [IU] | INTRAVENOUS | Status: DC | PRN
  Administered 2014-05-14: 40 [IU] via INTRAVENOUS

## 2014-05-14 MED ORDER — WITCH HAZEL-GLYCERIN EX PADS: 1.0000 "application " | MEDICATED_PAD | CUTANEOUS | Status: DC | PRN

## 2014-05-14 MED ORDER — SENNOSIDES-DOCUSATE SODIUM 8.6-50 MG PO TABS
2.0000 | ORAL_TABLET | ORAL | Status: DC
  Administered 2014-05-15 (×2): 2 via ORAL
  Filled 2014-05-14 (×2): qty 2

## 2014-05-14 MED ORDER — FENTANYL CITRATE 0.05 MG/ML IJ SOLN
INTRAMUSCULAR | Status: AC
  Filled 2014-05-14: qty 2

## 2014-05-14 MED ORDER — DIPHENHYDRAMINE HCL 25 MG PO CAPS: 25.0000 mg | ORAL_CAPSULE | ORAL | Status: DC | PRN

## 2014-05-14 MED ORDER — ONDANSETRON HCL 4 MG/2ML IJ SOLN: 4.0000 mg | INTRAMUSCULAR | Status: DC | PRN

## 2014-05-14 MED ORDER — DIPHENHYDRAMINE HCL 50 MG/ML IJ SOLN: 12.5000 mg | INTRAMUSCULAR | Status: DC | PRN

## 2014-05-14 MED ORDER — KETOROLAC TROMETHAMINE 30 MG/ML IJ SOLN
INTRAMUSCULAR | Status: AC
  Filled 2014-05-14: qty 1

## 2014-05-14 MED ORDER — FENTANYL CITRATE 0.05 MG/ML IJ SOLN: 25.0000 ug | INTRAMUSCULAR | Status: DC | PRN

## 2014-05-14 MED ORDER — FENTANYL CITRATE 0.05 MG/ML IJ SOLN
INTRAMUSCULAR | Status: DC | PRN
  Administered 2014-05-14: 25 ug via INTRATHECAL

## 2014-05-14 MED ORDER — SIMETHICONE 80 MG PO CHEW: 80.0000 mg | CHEWABLE_TABLET | ORAL | Status: DC | PRN

## 2014-05-14 MED ORDER — LACTATED RINGERS IV SOLN
INTRAVENOUS | Status: DC
  Administered 2014-05-14: 21:00:00 via INTRAVENOUS

## 2014-05-14 MED ORDER — ACETAMINOPHEN 500 MG PO TABS
1000.0000 mg | ORAL_TABLET | Freq: Four times a day (QID) | ORAL | Status: AC
  Administered 2014-05-14 – 2014-05-15 (×3): 1000 mg via ORAL
  Filled 2014-05-14 (×3): qty 2

## 2014-05-14 MED ORDER — PROMETHAZINE HCL 25 MG/ML IJ SOLN: 6.2500 mg | INTRAMUSCULAR | Status: DC | PRN

## 2014-05-14 MED ORDER — CEFAZOLIN SODIUM-DEXTROSE 2-3 GM-% IV SOLR
INTRAVENOUS | Status: DC | PRN
  Administered 2014-05-14: 2 g via INTRAVENOUS

## 2014-05-14 MED ORDER — SCOPOLAMINE 1 MG/3DAYS TD PT72
1.0000 | MEDICATED_PATCH | Freq: Once | TRANSDERMAL | Status: DC
  Filled 2014-05-14: qty 1

## 2014-05-14 MED ORDER — SCOPOLAMINE 1 MG/3DAYS TD PT72
MEDICATED_PATCH | TRANSDERMAL | Status: AC
  Filled 2014-05-14: qty 1

## 2014-05-14 MED ORDER — PHENYLEPHRINE 8 MG IN D5W 100 ML (0.08MG/ML) PREMIX OPTIME
INJECTION | INTRAVENOUS | Status: DC | PRN
  Administered 2014-05-14: 60 ug/min via INTRAVENOUS

## 2014-05-14 SURGICAL SUPPLY — 36 items
APL SKNCLS STERI-STRIP NONHPOA (GAUZE/BANDAGES/DRESSINGS) ×1
BARRIER ADHS 3X4 INTERCEED (GAUZE/BANDAGES/DRESSINGS) IMPLANT
BENZOIN TINCTURE PRP APPL 2/3 (GAUZE/BANDAGES/DRESSINGS) ×1 IMPLANT
BLADE SURG 10 STRL SS (BLADE) ×4 IMPLANT
BRR ADH 4X3 ABS CNTRL BYND (GAUZE/BANDAGES/DRESSINGS)
CLAMP CORD UMBIL (MISCELLANEOUS) IMPLANT
CLOTH BEACON ORANGE TIMEOUT ST (SAFETY) ×2 IMPLANT
CONTAINER PREFILL 10% NBF 15ML (MISCELLANEOUS) IMPLANT
DRAPE LG THREE QUARTER DISP (DRAPES) IMPLANT
DRSG OPSITE POSTOP 4X10 (GAUZE/BANDAGES/DRESSINGS) ×2 IMPLANT
DURAPREP 26ML APPLICATOR (WOUND CARE) ×2 IMPLANT
ELECT REM PT RETURN 9FT ADLT (ELECTROSURGICAL) ×2
ELECTRODE REM PT RTRN 9FT ADLT (ELECTROSURGICAL) ×1 IMPLANT
EXTRACTOR VACUUM M CUP 4 TUBE (SUCTIONS) IMPLANT
GLOVE BIO SURGEON STRL SZ 6.5 (GLOVE) ×2 IMPLANT
GOWN STRL REUS W/TWL LRG LVL3 (GOWN DISPOSABLE) ×4 IMPLANT
KIT ABG SYR 3ML LUER SLIP (SYRINGE) IMPLANT
NDL HYPO 25X5/8 SAFETYGLIDE (NEEDLE) ×1 IMPLANT
NEEDLE HYPO 22GX1.5 SAFETY (NEEDLE) IMPLANT
NEEDLE HYPO 25X5/8 SAFETYGLIDE (NEEDLE) ×2 IMPLANT
NS IRRIG 1000ML POUR BTL (IV SOLUTION) ×2 IMPLANT
PACK C SECTION WH (CUSTOM PROCEDURE TRAY) ×2 IMPLANT
PAD OB MATERNITY 4.3X12.25 (PERSONAL CARE ITEMS) ×2 IMPLANT
STAPLER VISISTAT 35W (STAPLE) IMPLANT
STRIP CLOSURE SKIN 1/2X4 (GAUZE/BANDAGES/DRESSINGS) ×1 IMPLANT
SUT CHROMIC 0 CTX 36 (SUTURE) ×4 IMPLANT
SUT PLAIN 0 NONE (SUTURE) IMPLANT
SUT PLAIN 2 0 XLH (SUTURE) IMPLANT
SUT VIC AB 0 CT1 27 (SUTURE) ×6
SUT VIC AB 0 CT1 27XBRD ANBCTR (SUTURE) ×3 IMPLANT
SUT VIC AB 4-0 KS 27 (SUTURE) IMPLANT
SYRINGE CONTROL L 12CC (SYRINGE) IMPLANT
SYRINGE CONTROL LL 12CC (SYRINGE) IMPLANT
TOWEL OR 17X24 6PK STRL BLUE (TOWEL DISPOSABLE) ×2 IMPLANT
TRAY FOLEY CATH 14FR (SET/KITS/TRAYS/PACK) ×2 IMPLANT
WATER STERILE IRR 1000ML POUR (IV SOLUTION) ×2 IMPLANT

## 2014-05-14 NOTE — Brief Op Note (Signed)
05/14/2014  10:12 AM  PATIENT:  Phyllis Harris  33 y.o. female  PRE-OPERATIVE DIAGNOSIS:  repeat cesarean section, Labor  POST-OPERATIVE DIAGNOSIS:  repeat cesarean section  PROCEDURE:  Procedure(s): CESAREAN SECTION (N/A)  SURGEON:  Surgeon(s) and Role:    * Cyril Mourning, MD - Primary  PHYSICIAN ASSISTANT:   ASSISTANTS: none   ANESTHESIA:   spinal  EBL:  Total I/O In: 2000 [I.V.:2000] Out: 550 [Urine:150; Blood:400]  BLOOD ADMINISTERED:none  DRAINS: Urinary Catheter (Foley)   LOCAL MEDICATIONS USED:  NONE  SPECIMEN:  No Specimen  DISPOSITION OF SPECIMEN:  N/A  COUNTS:  YES  TOURNIQUET:  * No tourniquets in log *  DICTATION: .Other Dictation: Dictation Number [id number]  PLAN OF CARE: Admit to inpatient   PATIENT DISPOSITION:  PACU - hemodynamically stable.   Delay start of Pharmacological VTE agent (>24hrs) due to surgical blood loss or risk of bleeding: not applicable

## 2014-05-14 NOTE — Anesthesia Postprocedure Evaluation (Signed)
Anesthesia Post Note  Patient: Phyllis Harris  Procedure(s) Performed: Procedure(s) (LRB): CESAREAN SECTION (N/A)  Anesthesia type: Spinal  Patient location: PACU  Post pain: Pain level controlled  Post assessment: Post-op Vital signs reviewed  Last Vitals:  Filed Vitals:   05/14/14 0919  BP: 142/62  Pulse: 91  Temp:   Resp:     Post vital signs: Reviewed  Level of consciousness: awake  Complications: No apparent anesthesia complications

## 2014-05-14 NOTE — Op Note (Signed)
Phyllis Harris, Phyllis Harris              ACCOUNT NO.:  [id number]  MEDICAL RECORD NO.:  [id number]  LOCATION:  9104                          FACILITY:  [facility]  PHYSICIAN:  Ariyanah Aguado L. Magnus Crescenzo, M.D.DATE OF BIRTH:  [birth year]  DATE OF PROCEDURE:  05/14/2014 DATE OF DISCHARGE:                              OPERATIVE REPORT   PREOPERATIVE DIAGNOSES:  Intrauterine pregnancy at term in labor and previous cesarean section.  POSTOPERATIVE DIAGNOSES:  Intrauterine pregnancy at term in labor and previous cesarean section.  PROCEDURE:  Repeat low transverse cesarean section.  SURGEON:  Adelena Desantiago L. Helane Rima, M.D.  ANESTHESIA:  Spinal.  ESTIMATED BLOOD LOSS:  Less than 500.  COMPLICATIONS:  None.  DRAINS:  Foley catheter.  DESCRIPTION OF PROCEDURE:  The patient was taken to the operating room. Her spinal was placed.  She was prepped and draped in usual sterile fashion.  A Foley catheter was inserted.  A low transverse incision was made at the area of the previous C-section scar.  The fascia scored in the midline.  The rectus muscles were divided in the midline.  The peritoneum was entered bluntly.  The peritoneal incision was then stretched.  The bladder blade was inserted.  The lower uterine segment was identified and the bladder flap was created sharply and then digitally.  The bladder blade was then readjusted.  A low transverse incision was made in the uterus.  The uterus was entered using a hemostat.  Amniotic fluid was clear.  The baby was in cephalic presentation, was delivered easily without any problem with a female infant, large for gestational age.  Apgars 9 at one minute and 10 at five minutes.  The cord was clamped and cut.  The baby was handed to the awaiting neonatal team.  The placenta was manually removed, noted to be normal and intact with a three-vessel cord.  The uterus was exteriorized and cleared of all clots and debris.  The uterine incision was closed in 1 layer using 0  chromic in a running locked stitch.  The irrigation was performed.  Hemostasis was noted.  The peritoneum was closed using 0 Vicryl in a running stitch.  The fascia was closed using 0 Vicryl in a running stitch.  The skin was closed using 4-0 Vicryl on a Keith needle in a subcuticular fashion.  Steri-Strips were applied.  All sponge, lap, and instrument counts were correct x2.  The patient went to recovery room in stable condition.     Gilad Dugger L. Helane Rima, M.D.     Nevin Bloodgood  D:  05/14/2014  T:  05/14/2014  Job:  [id number]

## 2014-05-14 NOTE — Anesthesia Procedure Notes (Signed)
Spinal  Patient location during procedure: OR Start time: 05/14/2014 9:38 AM Staffing Anesthesiologist: Rudean Curt Performed by: anesthesiologist  Preanesthetic Checklist Completed: patient identified, site marked, surgical consent, pre-op evaluation, timeout performed, IV checked, risks and benefits discussed and monitors and equipment checked Spinal Block Patient position: sitting Prep: DuraPrep Patient monitoring: heart rate, cardiac monitor, continuous pulse ox and blood pressure Approach: midline Location: L3-4 Injection technique: single-shot Needle Needle type: Sprotte  Needle gauge: 24 G Needle length: 9 cm Assessment Sensory level: T4 Additional Notes Patient identified.  Risk benefits discussed including failed block, incomplete pain control, headache, nerve damage, paralysis, blood pressure changes, nausea, vomiting, reactions to medication both toxic or allergic, and postpartum back pain.  Patient expressed understanding and wished to proceed.  All questions were answered.  Sterile technique used throughout procedure.  CSF was clear.  No parasthesia or other complications.  Please see nursing notes for vital signs.

## 2014-05-14 NOTE — H&P (Signed)
Phyllis Harris is a 32 y.o. G 4 P 1021 at 39 weeks presents with contractions. Scheduled C Section tomorrow NPO since 7 pm Maternal Medical History:  Reason for admission: Contractions.   Contractions: Onset was 3-5 hours ago.   Frequency: regular.   Perceived severity is moderate.    Fetal activity: Perceived fetal activity is normal.      OB History   Grav Para Term Preterm Abortions TAB SAB Ect Mult Living   4 1 1  2 1 1   1      Past Medical History  Diagnosis Date  . Hypothyroidism   . Headache(784.0)     occ  . GERD (gastroesophageal reflux disease)     with pregnancy  . Cancer 2008    thyroid    Past Surgical History  Procedure Laterality Date  . Thyroidectomy    . Cesarean section    . Dilation and curettage of uterus      miscarriage   Family History: family history is not on file. Social History:  reports that she has never smoked. She has never used smokeless tobacco. She reports that she drinks alcohol. She reports that she does not use illicit drugs.   Prenatal Transfer Tool  Maternal Diabetes: No Genetic Screening: Normal Maternal Ultrasounds/Referrals: Normal Fetal Ultrasounds or other Referrals:  None Maternal Substance Abuse:  No Significant Maternal Medications:  None Significant Maternal Lab Results:  None Other Comments:  None  Review of Systems  All other systems reviewed and are negative.     Blood pressure 133/75, pulse 92, temperature 97.9 F (36.6 C), temperature source Oral, resp. rate 18, height 5\' 8"  (1.727 m), weight 102.059 kg (225 lb), last menstrual period 08/15/2013. Maternal Exam:  Uterine Assessment: Contraction frequency is regular.   Abdomen: Fetal presentation: vertex     Fetal Exam Fetal State Assessment: Category I - tracings are normal.     Physical Exam  Nursing note and vitals reviewed. Constitutional: She appears well-developed.  HENT:  Head: Normocephalic.  Cardiovascular: Normal rate, regular  rhythm and normal heart sounds.   Respiratory: Effort normal and breath sounds normal.    Prenatal labs: ABO, Rh: --/--/B POS, B POS (08/03 1040) Antibody: NEG (08/03 1040) Rubella: Immune (01/13 0000) RPR: NON REAC (08/03 1040)  HBsAg: Negative (01/13 0000)  HIV: Non-reactive (01/13 0000)  GBS:     Assessment/Plan: IUP at 39 weeks Previous C Section Labor REPEAT C Section Risks reviewed Consent signed OR notified  Phyllis Harris 05/14/2014, 8:41 AM

## 2014-05-14 NOTE — Transfer of Care (Signed)
Immediate Anesthesia Transfer of Care Note  Patient: Phyllis Harris  Procedure(s) Performed: Procedure(s): CESAREAN SECTION (N/A)  Patient Location: PACU  Anesthesia Type:Spinal  Level of Consciousness: awake, alert  and oriented  Airway & Oxygen Therapy: Patient Spontanous Breathing  Post-op Assessment: Report given to PACU RN and Post -op Vital signs reviewed and stable  Post vital signs: Reviewed and stable  Complications: No apparent anesthesia complications

## 2014-05-14 NOTE — Lactation Note (Signed)
This note was copied from the chart of [facility]. Lactation Consultation Note  Patient Name: Phyllis Harris QBHAL'P Date: 05/14/2014 Reason for consult: Initial assessment;Other (Comment) (charting for exclusion) Mom breastfed her 35 yo daughter for 6 months but she needed to supplement and pump for her and never had enough for exclusive breastfeeding.  Parents insist on supplementing their newborn and have been informed of cautions and feeding amount guidelines.  Mom states she knows how to hand express her milk and also has been provided with an electric breast pump from her insurance.  LC reviewed importance of early and frequent cue feedings, STS and additional pumping if baby receiving supplement.   Mom encouraged to feed baby 8-12 times/24 hours and with feeding cues. LC encouraged review of Baby and Me pp 9, 14 and 20-25 for STS and BF information. LC provided [employer] brochure and reviewed [facility] services and list of community and web site resources.    Maternal Data Formula Feeding for Exclusion: Yes Reason for exclusion: Mother's choice to formula and breast feed on admission Has patient been taught Hand Expression?: Yes (per RN and reported by mom to [facility]) Does the patient have breastfeeding experience prior to this delivery?: Yes  Feeding Feeding Type: Breast Fed Nipple Type: Regular Length of feed: 10 min  LATCH Score/Interventions Latch: Grasps breast easily, tongue down, lips flanged, rhythmical sucking.  Audible Swallowing: A few with stimulation Intervention(s): Skin to skin;Hand expression;Alternate breast massage  Type of Nipple: Everted at rest and after stimulation  Comfort (Breast/Nipple): Soft / non-tender     Hold (Positioning): Assistance needed to correctly position infant at breast and maintain latch. Intervention(s): Breastfeeding basics reviewed;Support Pillows;Position options;Skin to skin  LATCH Score: 8  Lactation Tools Discussed/Used    STS, cue feedings, hand expression  Consult Status Consult Status: Follow-up Date: 05/15/14 Follow-up type: In-patient    Junious Dresser [facility] 05/14/2014, 8:33 PM

## 2014-05-14 NOTE — Anesthesia Preprocedure Evaluation (Signed)
Anesthesia Evaluation  Patient identified by MRN, date of birth, ID band Patient awake    Reviewed: Allergy & Precautions, H&P , NPO status , Patient's Chart, lab work & pertinent test results  Airway Mallampati: II      Dental   Pulmonary  breath sounds clear to auscultation        Cardiovascular Exercise Tolerance: Good Rhythm:regular Rate:Normal     Neuro/Psych  Headaches,    GI/Hepatic GERD-  ,  Endo/Other  Hypothyroidism   Renal/GU      Musculoskeletal   Abdominal   Peds  Hematology   Anesthesia Other Findings   Reproductive/Obstetrics (+) Pregnancy                           Anesthesia Physical Anesthesia Plan  ASA: II and emergent  Anesthesia Plan: Spinal   Post-op Pain Management:    Induction:   Airway Management Planned:   Additional Equipment:   Intra-op Plan:   Post-operative Plan:   Informed Consent: I have reviewed the patients History and Physical, chart, labs and discussed the procedure including the risks, benefits and alternatives for the proposed anesthesia with the patient or authorized representative who has indicated his/her understanding and acceptance.     Plan Discussed with: Anesthesiologist, CRNA and Surgeon  Anesthesia Plan Comments:         Anesthesia Quick Evaluation

## 2014-05-15 ENCOUNTER — Inpatient Hospital Stay (HOSPITAL_COMMUNITY): Admission: AD | Admit: 2014-05-15 | Payer: 59 | Source: Ambulatory Visit | Admitting: Obstetrics and Gynecology

## 2014-05-15 LAB — CBC
HCT: 33.2 % — ABNORMAL LOW (ref 36.0–46.0)
HEMATOCRIT: 33.3 % — AB (ref 36.0–46.0)
Hemoglobin: 11.3 g/dL — ABNORMAL LOW (ref 12.0–15.0)
Hemoglobin: 11.4 g/dL — ABNORMAL LOW (ref 12.0–15.0)
MCH: 30.1 pg (ref 26.0–34.0)
MCH: 30.2 pg (ref 26.0–34.0)
MCHC: 34 g/dL (ref 30.0–36.0)
MCHC: 34.2 g/dL (ref 30.0–36.0)
MCV: 88.3 fL (ref 78.0–100.0)
MCV: 88.1 fL (ref 78.0–100.0)
PLATELETS: 128 10*3/uL — AB (ref 150–400)
Platelets: 127 10*3/uL — ABNORMAL LOW (ref 150–400)
RBC: 3.76 MIL/uL — AB (ref 3.87–5.11)
RBC: 3.78 MIL/uL — AB (ref 3.87–5.11)
RDW: 13.9 % (ref 11.5–15.5)
RDW: 13.8 % (ref 11.5–15.5)
WBC: 12.6 10*3/uL — AB (ref 4.0–10.5)
WBC: 12.6 10*3/uL — ABNORMAL HIGH (ref 4.0–10.5)

## 2014-05-15 LAB — BIRTH TISSUE RECOVERY COLLECTION (PLACENTA DONATION)

## 2014-05-15 SURGERY — Surgical Case
Anesthesia: Regional

## 2014-05-15 NOTE — Progress Notes (Signed)
Subjective: Postpartum Day 1: Cesarean Delivery Patient reports tolerating PO.    Objective: Vital signs in last 24 hours: Temp:  [97.7 F (36.5 C)-98.5 F (36.9 C)] 98.5 F (36.9 C) (08/05 0400) Pulse Rate:  [51-92] 62 (08/05 0400) Resp:  [10-27] 18 (08/05 0400) BP: (103-142)/(33-87) 127/66 mmHg (08/05 0400) SpO2:  [96 %-99 %] 96 % (08/05 0700) Weight:  [225 lb (102.059 kg)] 225 lb (102.059 kg) (08/04 0824)  Physical Exam:  General: alert and cooperative Lochia: appropriate Uterine Fundus: firm Incision: honeycomb dressing noted with old drainage on bandage . No active bleeding noted DVT Evaluation: No evidence of DVT seen on physical exam. Negative Homan's sign. No cords or calf tenderness. Calf/Ankle edema is present.   Recent Labs  05/14/14 0907 05/15/14 0601  HGB 13.0 11.3*  HCT 38.1 33.2*    Assessment/Plan: Status post Cesarean section. Doing well postoperatively.  Does not desire circ.  Sharis Keeran G 05/15/2014, 7:53 AM

## 2014-05-16 ENCOUNTER — Encounter (HOSPITAL_COMMUNITY): Payer: Self-pay | Admitting: Obstetrics and Gynecology

## 2014-05-16 MED ORDER — IBUPROFEN 600 MG PO TABS: 600.0000 mg | ORAL_TABLET | Freq: Four times a day (QID) | ORAL | Status: DC

## 2014-05-16 NOTE — Discharge Summary (Signed)
Obstetric Discharge Summary Reason for Admission: onset of labor Prenatal Procedures: ultrasound Intrapartum Procedures: cesarean: low cervical, transverse Postpartum Procedures: none Complications-Operative and Postpartum: none Hemoglobin  Date Value Ref Range Status  05/15/2014 11.4* 12.0 - 15.0 Harris/dL Final     HCT  Date Value Ref Range Status  05/15/2014 33.3* 36.0 - 46.0 % Final    Physical Exam:  General: alert and cooperative Lochia: appropriate Uterine Fundus: firm Incision: honeycomb dressing CDI DVT Evaluation: No evidence of DVT seen on physical exam. Negative Homan's sign. No cords or calf tenderness.  Discharge Diagnoses: Term Pregnancy-delivered  Discharge Information: Date: 05/16/2014 Activity: pelvic rest Diet: routine Medications: PNV, Ibuprofen and synthroid Condition: stable Instructions: refer to practice specific booklet Discharge to: home   Newborn Data: Live born female  Birth Weight: 10 lb 7.2 oz ([id number] Harris) APGAR: 9, 10  Home with mother.  Phyllis Harris 05/16/2014, 8:03 AM

## 2014-05-17 LAB — TYPE AND SCREEN
ABO/RH(D): B POS
Antibody Screen: NEGATIVE
UNIT DIVISION: 0
Unit division: 0

## 2014-07-01 ENCOUNTER — Other Ambulatory Visit: Payer: Self-pay | Admitting: Obstetrics and Gynecology

## 2014-07-02 LAB — CYTOLOGY - PAP

## 2014-07-23 ENCOUNTER — Ambulatory Visit (HOSPITAL_COMMUNITY): Payer: 59

## 2014-07-25 ENCOUNTER — Ambulatory Visit (HOSPITAL_COMMUNITY)
Admission: RE | Admit: 2014-07-25 | Discharge: 2014-07-25 | Disposition: A | Discharge status: Home / Self Care | Payer: 59 | Source: Ambulatory Visit | Attending: Obstetrics and Gynecology | Admitting: Obstetrics and Gynecology

## 2014-07-25 NOTE — Lactation Note (Signed)
Lactation Consult  Mother's reason for visit: Low milk supply  Visit Type: Feeding assessment  Appointment Notes:  Confirmed  Consult:  Initial Lactation Consultant:  Myer Haff  ________________________________________________________________________  Phyllis Harris Name: Phyllis Harris  Date of Birth: 05/14/2016  Pediatrician: Dr. Rhodia Albright  Gender: female  Gestational Age: [redacted]w[redacted]d (At Birth)  Birth Weight: 10 lb 7.2 oz (4740 g)  Weight at Discharge: Weight: 9 lb 12.3 oz (4430 g) Date of Discharge: 05/16/2014    [facility] Weights    05/14/14 1610 05/14/14 2319 05/15/14 2339   Weight: 10 lb 7.2 oz (4740 g) 10 lb 1.7 oz (4585 g) 9 lb 12.3 oz (4430 g)   Last weight taken from location outside of [facility]: 14.12 oz  Location:Pediatrician's office  Weight today: 15.10 oz                  ________________________________________________________________________  Mother's Name: Phyllis Harris Type of delivery:   Breastfeeding Experience:  Per mom challenges with 1st and 2nd Phyllis with milk supply , seems better this time , still having to supplement  Maternal Medical Conditions:  Thyroid , Hypothyroidism , and thyroidectomy  Maternal Medications:  Meds for thyroid   ________________________________________________________________________  Breastfeeding History (Post Discharge)  Frequency of breastfeeding:  2-3 x's a day  Duration of feeding:  15-20 mins   Pumping - with a DEBP Rumble Tuff for 15 -68mins and total volume yield = 2 oz , sometimes only 30 ml ( every 3-4 hours days /evening , not night )  Supplementing - with EBM and Formula ( Similiac - Gentle due to the Phyllis being gasey )   Infant Intake and Output Assessment  Voids:  6in 24 hrs.  Color:  Clear yellow Stools:  2-3 24 hrs.  Color:  Brown and Yellow  ________________________________________________________________________  Maternal Breast Assessment  Breast:  Soft Nipple:  Erect Pain level:  0 Pain  interventions:  Expressed breast milk  _______________________________________________________________________ Feeding Assessment/Evaluation  Initial feeding assessment:  Infant's oral assessment:  WNL  Positioning:  Cradle Left breast  LATCH documentation:  Latch:  2 = Grasps breast easily, tongue down, lips flanged, rhythmical sucking.  Audible swallowing:  1  Type of nipple:  2 = Everted at rest and after stimulation  Comfort (Breast/Nipple):  2 = Soft / non-tender  Hold (Positioning):  2 = No assistance needed to correctly position infant at breast  LATCH score:  Phyllis Harris seems frustrated and pulling  from mom  Attached assessment:  Deep  Lips flanged:  Yes.    Lips untucked:  Yes.    Suck assessment:  Nutritive and non -nutritive and puling away from mom , seems frustrated with the lack of flow . \ So after discussion with mom added a SNS, Phyllis Harris intermittently was able to stay in a consistent swallowing pattern.   Tools:  Supplemental nutrition system Instructed on use and cleaning of tool:  Yes.    Pre-feed weight:  6832 g  (15  lb. 1 oz.) Post-feed weight:               Did not re-weigh , because Phyllis didnh't end up finishing feeding at breast and received a bottle  Amount transferred:   Amount supplemented: approximately 2 oz   No  Total amount pumped post feed: did not pump   Total amount transferred - see note above  Total supplement given:  Approx. 2 oz   Per mom the Phyllis usually  doesn't feed much during the day , feeds 3 x's at night  Lactation Plan of care - Praised mom for her efforts breast feeding and pumping ( extra pumping due to low milk supply ) , also for keeping up with her BF Calories, Herb supplements)                                            Per mom has only been taking Fenugreek, LC recommended switching up to Mother's Love ( 3 Herbs) , ( Blessed Thistle, Goats Rue, Fenugreek ), and stop just the Fenugreek                                             Mom has a history of low milk supply - LC stressed the importance of "Focusing on wheat she can give her Phyllis and not what she can't "                                            LC suggested the SNS , and some feedings ( used and demo at consult )                                            Transition Phyllis Harris to more day/evening feedings instead of 4 feedings at night ,                                             LC suggested gradually increase volume during the day , and and the volume need will decrease on nights                                             Consider Power pumping x1 every day or every other day

## 2014-08-12 ENCOUNTER — Encounter (HOSPITAL_COMMUNITY): Payer: Self-pay | Admitting: Obstetrics and Gynecology

## 2016-02-16 DIAGNOSIS — E890 Postprocedural hypothyroidism: Secondary | ICD-10-CM | POA: Diagnosis not present

## 2016-02-16 DIAGNOSIS — C73 Malignant neoplasm of thyroid gland: Secondary | ICD-10-CM | POA: Diagnosis not present

## 2016-03-07 DIAGNOSIS — R300 Dysuria: Secondary | ICD-10-CM | POA: Diagnosis not present

## 2016-03-07 DIAGNOSIS — N3000 Acute cystitis without hematuria: Secondary | ICD-10-CM | POA: Diagnosis not present

## 2016-03-24 DIAGNOSIS — R7301 Impaired fasting glucose: Secondary | ICD-10-CM | POA: Diagnosis not present

## 2016-03-24 DIAGNOSIS — C73 Malignant neoplasm of thyroid gland: Secondary | ICD-10-CM | POA: Diagnosis not present

## 2016-03-24 DIAGNOSIS — E890 Postprocedural hypothyroidism: Secondary | ICD-10-CM | POA: Diagnosis not present

## 2016-07-21 DIAGNOSIS — J069 Acute upper respiratory infection, unspecified: Secondary | ICD-10-CM | POA: Diagnosis not present

## 2016-07-21 DIAGNOSIS — B9789 Other viral agents as the cause of diseases classified elsewhere: Secondary | ICD-10-CM | POA: Diagnosis not present

## 2016-07-24 DIAGNOSIS — R05 Cough: Secondary | ICD-10-CM | POA: Diagnosis not present

## 2016-07-28 DIAGNOSIS — J209 Acute bronchitis, unspecified: Secondary | ICD-10-CM | POA: Diagnosis not present

## 2016-08-09 DIAGNOSIS — J209 Acute bronchitis, unspecified: Secondary | ICD-10-CM | POA: Diagnosis not present

## 2016-08-09 DIAGNOSIS — J9801 Acute bronchospasm: Secondary | ICD-10-CM | POA: Diagnosis not present

## 2016-08-31 DIAGNOSIS — Z01419 Encounter for gynecological examination (general) (routine) without abnormal findings: Secondary | ICD-10-CM | POA: Diagnosis not present

## 2016-08-31 DIAGNOSIS — Z6830 Body mass index (BMI) 30.0-30.9, adult: Secondary | ICD-10-CM | POA: Diagnosis not present

## 2016-11-08 DIAGNOSIS — E890 Postprocedural hypothyroidism: Secondary | ICD-10-CM | POA: Diagnosis not present

## 2016-11-16 ENCOUNTER — Other Ambulatory Visit: Payer: Self-pay | Admitting: Endocrinology

## 2016-11-16 DIAGNOSIS — C73 Malignant neoplasm of thyroid gland: Secondary | ICD-10-CM

## 2016-11-16 DIAGNOSIS — E890 Postprocedural hypothyroidism: Secondary | ICD-10-CM | POA: Diagnosis not present

## 2016-11-16 DIAGNOSIS — R7301 Impaired fasting glucose: Secondary | ICD-10-CM | POA: Diagnosis not present

## 2016-11-17 ENCOUNTER — Ambulatory Visit
Admission: RE | Admit: 2016-11-17 | Discharge: 2016-11-17 | Disposition: A | Discharge status: Home / Self Care | Payer: BLUE CROSS/BLUE SHIELD | Source: Ambulatory Visit | Attending: Endocrinology | Admitting: Endocrinology

## 2016-11-17 DIAGNOSIS — C73 Malignant neoplasm of thyroid gland: Secondary | ICD-10-CM

## 2016-11-17 DIAGNOSIS — E042 Nontoxic multinodular goiter: Secondary | ICD-10-CM | POA: Diagnosis not present

## 2017-04-11 DIAGNOSIS — R7301 Impaired fasting glucose: Secondary | ICD-10-CM | POA: Diagnosis not present

## 2017-04-11 DIAGNOSIS — E890 Postprocedural hypothyroidism: Secondary | ICD-10-CM | POA: Diagnosis not present

## 2017-04-11 DIAGNOSIS — C73 Malignant neoplasm of thyroid gland: Secondary | ICD-10-CM | POA: Diagnosis not present

## 2017-04-11 DIAGNOSIS — E162 Hypoglycemia, unspecified: Secondary | ICD-10-CM | POA: Diagnosis not present

## 2017-05-30 DIAGNOSIS — E890 Postprocedural hypothyroidism: Secondary | ICD-10-CM | POA: Diagnosis not present

## 2017-06-03 DIAGNOSIS — C73 Malignant neoplasm of thyroid gland: Secondary | ICD-10-CM | POA: Diagnosis not present

## 2017-06-03 DIAGNOSIS — R7301 Impaired fasting glucose: Secondary | ICD-10-CM | POA: Diagnosis not present

## 2017-06-03 DIAGNOSIS — E890 Postprocedural hypothyroidism: Secondary | ICD-10-CM | POA: Diagnosis not present

## 2017-07-12 DIAGNOSIS — B300 Keratoconjunctivitis due to adenovirus: Secondary | ICD-10-CM | POA: Diagnosis not present

## 2017-07-27 DIAGNOSIS — B300 Keratoconjunctivitis due to adenovirus: Secondary | ICD-10-CM | POA: Diagnosis not present

## 2017-09-05 DIAGNOSIS — E890 Postprocedural hypothyroidism: Secondary | ICD-10-CM | POA: Diagnosis not present

## 2017-10-15 DIAGNOSIS — R6889 Other general symptoms and signs: Secondary | ICD-10-CM | POA: Diagnosis not present

## 2017-10-20 DIAGNOSIS — Z01419 Encounter for gynecological examination (general) (routine) without abnormal findings: Secondary | ICD-10-CM | POA: Diagnosis not present

## 2017-10-20 DIAGNOSIS — Z6830 Body mass index (BMI) 30.0-30.9, adult: Secondary | ICD-10-CM | POA: Diagnosis not present

## 2017-10-20 LAB — HM PAP SMEAR

## 2017-11-04 DIAGNOSIS — Z30430 Encounter for insertion of intrauterine contraceptive device: Secondary | ICD-10-CM | POA: Diagnosis not present

## 2017-11-04 DIAGNOSIS — Z3202 Encounter for pregnancy test, result negative: Secondary | ICD-10-CM | POA: Diagnosis not present

## 2017-11-07 DIAGNOSIS — E890 Postprocedural hypothyroidism: Secondary | ICD-10-CM | POA: Diagnosis not present

## 2017-11-18 IMAGING — US US THYROID
1 series · 14 of 16 positions shown · non-contrast
Comparison: Most recent comparison ultrasound 11/29/2006, previous
to the surgery.

CLINICAL DATA: 35-year-old female with a history of total
thyroidectomy 10 years prior.

EXAM:
THYROID ULTRASOUND
TECHNIQUE: Ultrasound examination of the thyroid gland and adjacent soft
tissues was performed.

[Series 1: us thyroid · 0.05mm/px · 14 of 16 slices shown]
[im 1/16]
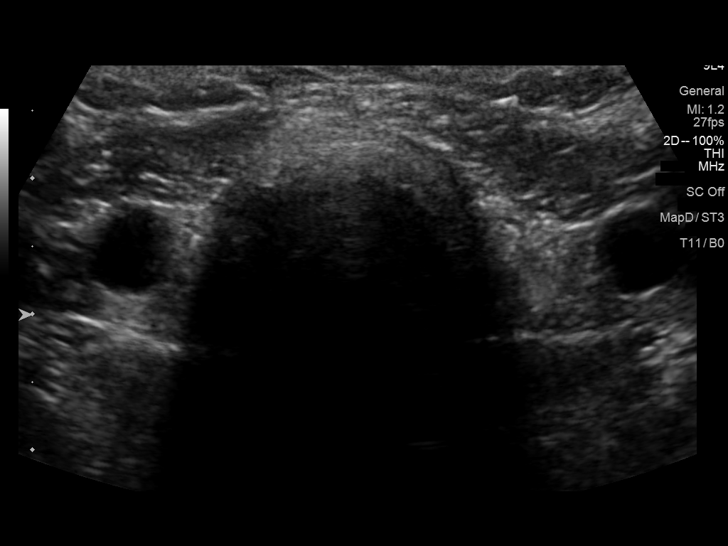
[im 2/16]
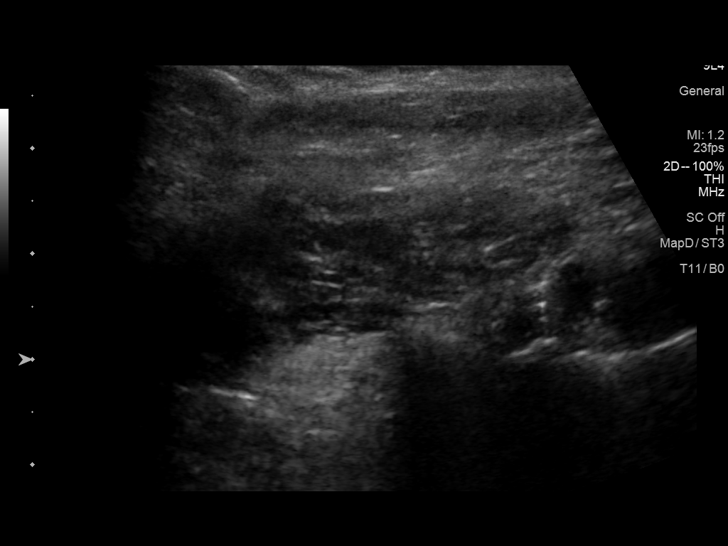
[im 3/16]
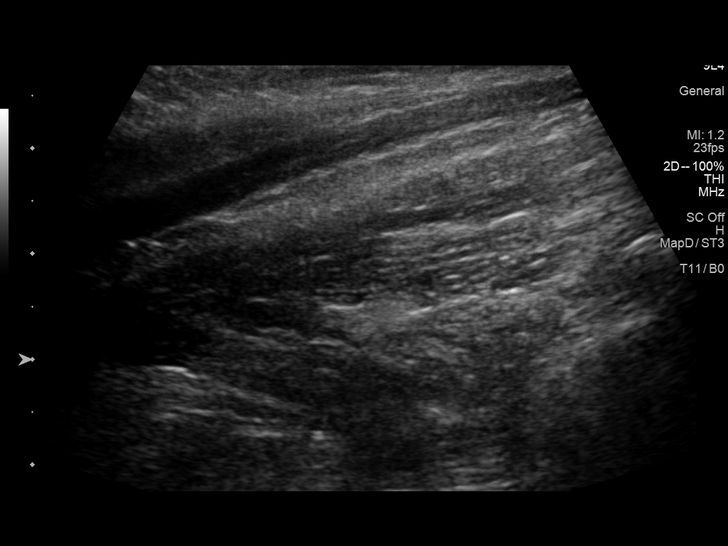
[im 5/16]
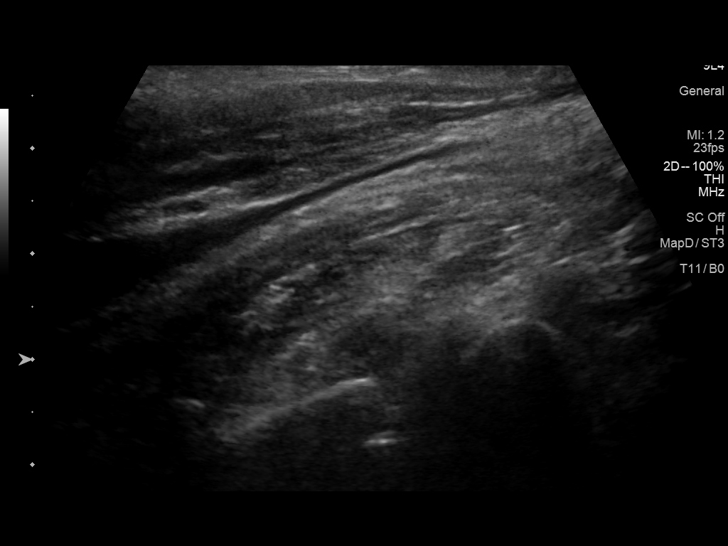
[im 6/16]
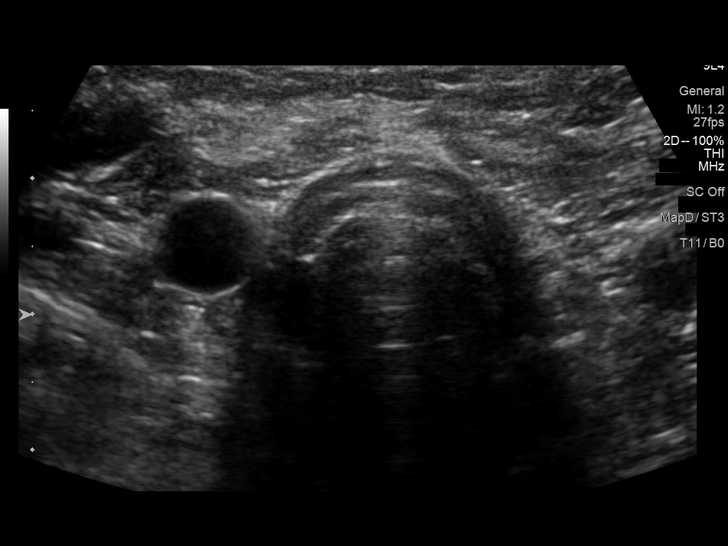
[im 7/16]
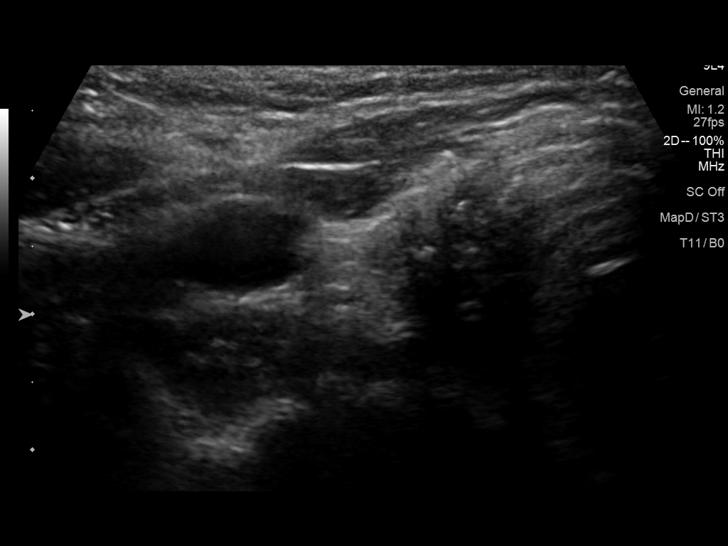
[im 8/16]
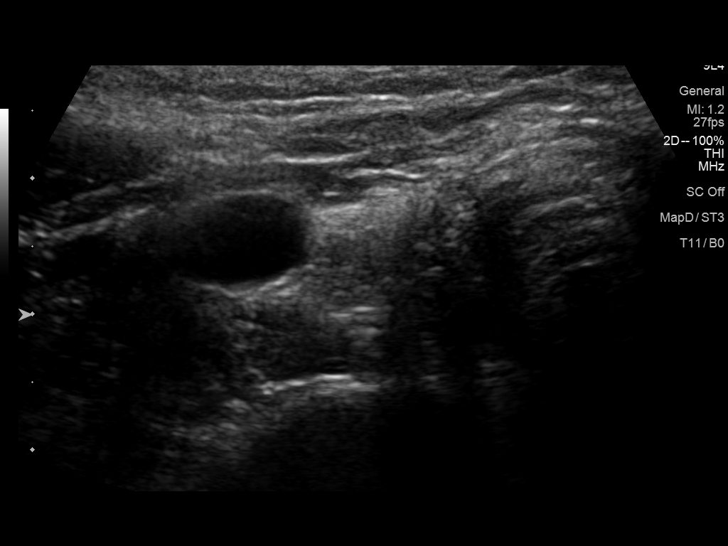
[im 9/16]
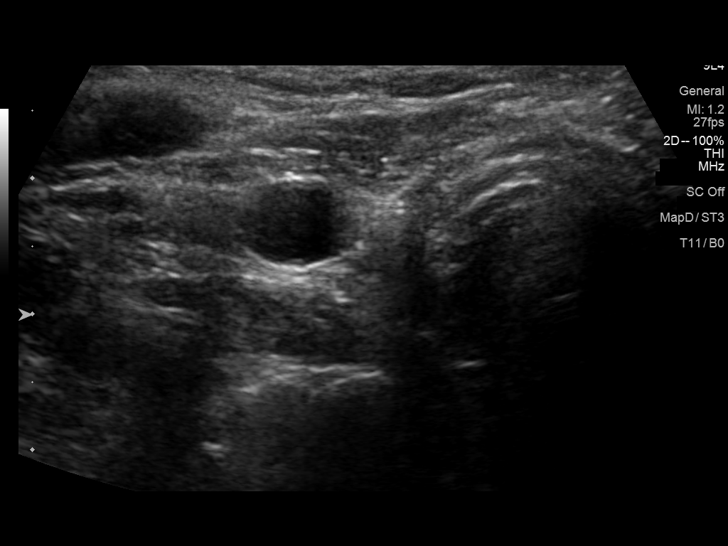
[im 10/16]
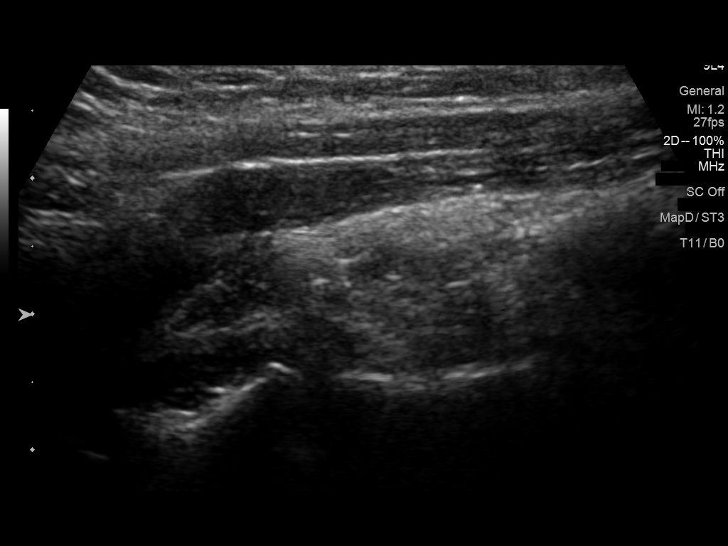
[im 11/16]
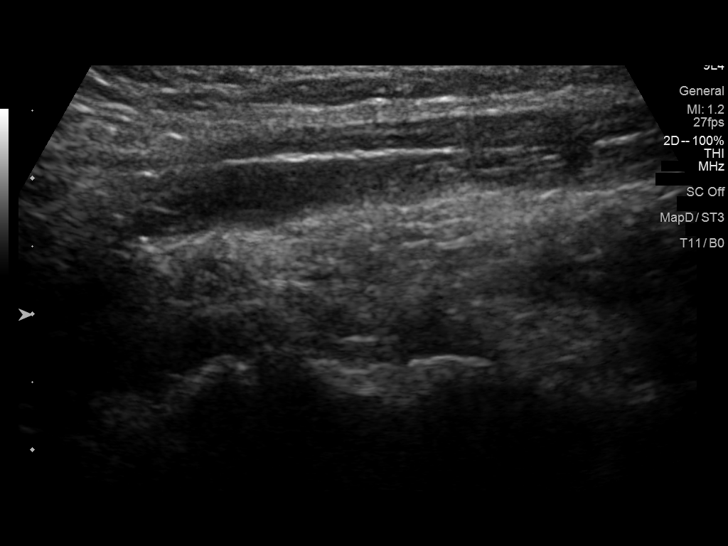
[im 13/16]
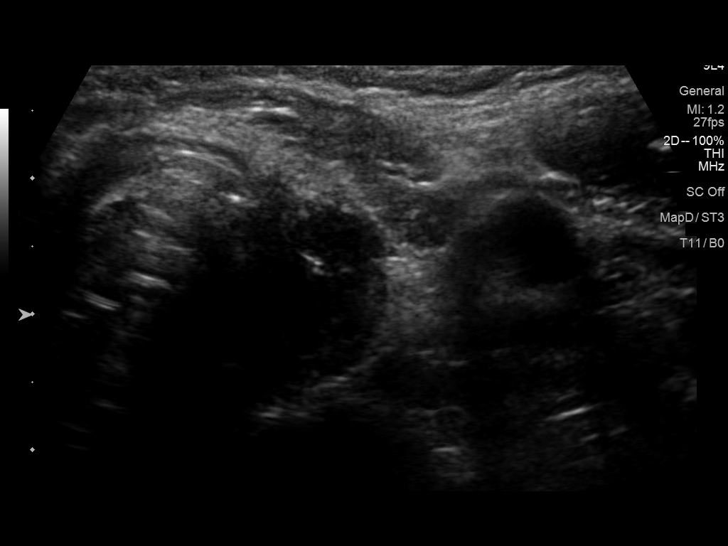
[im 14/16]
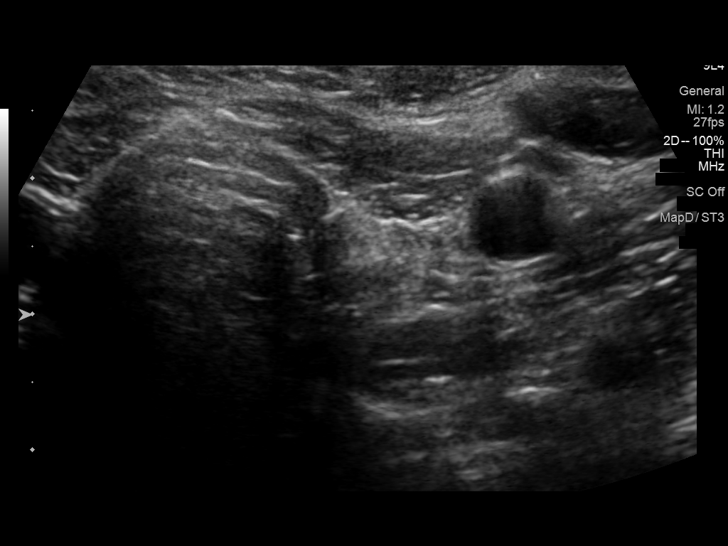
[im 15/16]
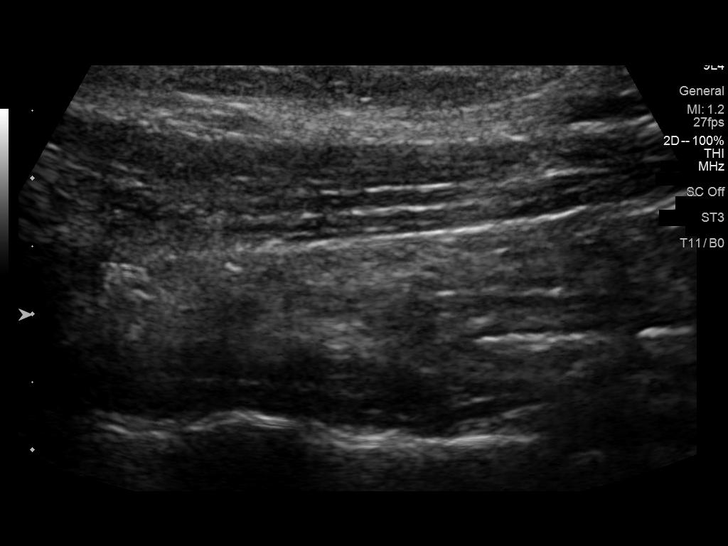
[im 16/16]
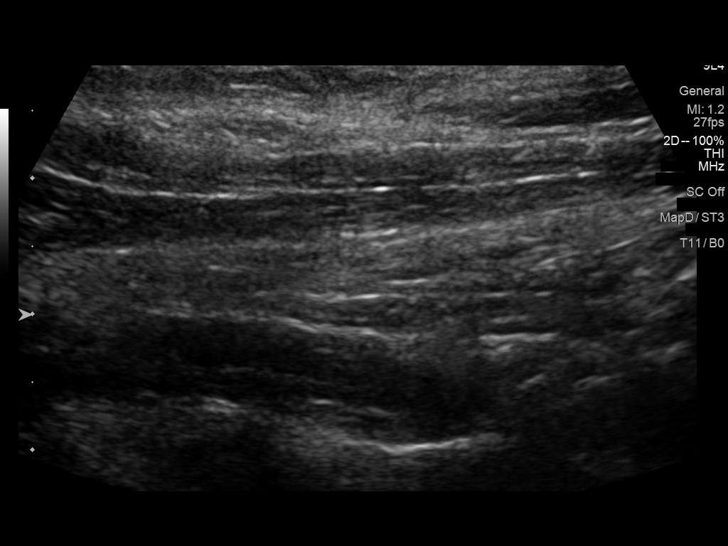

[14 of 16 positions shown; findings below may reference images not displayed]

FINDINGS: Surgical changes of thyroidectomy.

_________________________________________________________
IMPRESSION: Surgical changes of total thyroidectomy with no residual tissue
identified.

## 2017-12-21 DIAGNOSIS — J029 Acute pharyngitis, unspecified: Secondary | ICD-10-CM | POA: Diagnosis not present

## 2017-12-21 DIAGNOSIS — Z20818 Contact with and (suspected) exposure to other bacterial communicable diseases: Secondary | ICD-10-CM | POA: Diagnosis not present

## 2017-12-25 DIAGNOSIS — Z20818 Contact with and (suspected) exposure to other bacterial communicable diseases: Secondary | ICD-10-CM | POA: Diagnosis not present

## 2017-12-25 DIAGNOSIS — J029 Acute pharyngitis, unspecified: Secondary | ICD-10-CM | POA: Diagnosis not present

## 2017-12-25 DIAGNOSIS — R0982 Postnasal drip: Secondary | ICD-10-CM | POA: Diagnosis not present

## 2018-01-17 DIAGNOSIS — F330 Major depressive disorder, recurrent, mild: Secondary | ICD-10-CM | POA: Diagnosis not present

## 2018-01-25 DIAGNOSIS — F330 Major depressive disorder, recurrent, mild: Secondary | ICD-10-CM | POA: Diagnosis not present

## 2018-02-09 DIAGNOSIS — E890 Postprocedural hypothyroidism: Secondary | ICD-10-CM | POA: Diagnosis not present

## 2018-02-15 DIAGNOSIS — F330 Major depressive disorder, recurrent, mild: Secondary | ICD-10-CM | POA: Diagnosis not present

## 2018-03-15 DIAGNOSIS — F330 Major depressive disorder, recurrent, mild: Secondary | ICD-10-CM | POA: Diagnosis not present

## 2018-04-19 DIAGNOSIS — F330 Major depressive disorder, recurrent, mild: Secondary | ICD-10-CM | POA: Diagnosis not present

## 2018-04-25 DIAGNOSIS — F330 Major depressive disorder, recurrent, mild: Secondary | ICD-10-CM | POA: Diagnosis not present

## 2018-06-09 DIAGNOSIS — R7301 Impaired fasting glucose: Secondary | ICD-10-CM | POA: Diagnosis not present

## 2018-06-09 DIAGNOSIS — E890 Postprocedural hypothyroidism: Secondary | ICD-10-CM | POA: Diagnosis not present

## 2018-06-21 DIAGNOSIS — F330 Major depressive disorder, recurrent, mild: Secondary | ICD-10-CM | POA: Diagnosis not present

## 2018-09-04 DIAGNOSIS — R7301 Impaired fasting glucose: Secondary | ICD-10-CM | POA: Diagnosis not present

## 2018-09-04 DIAGNOSIS — E890 Postprocedural hypothyroidism: Secondary | ICD-10-CM | POA: Diagnosis not present

## 2018-09-04 DIAGNOSIS — C73 Malignant neoplasm of thyroid gland: Secondary | ICD-10-CM | POA: Diagnosis not present

## 2018-10-17 ENCOUNTER — Ambulatory Visit: Payer: BLUE CROSS/BLUE SHIELD | Admitting: Physician Assistant

## 2018-10-17 ENCOUNTER — Encounter: Payer: Self-pay | Admitting: Physician Assistant

## 2018-10-17 VITALS — BP 110/70 | HR 70 | Temp 98.2°F | Ht 68.0 in | Wt 209.2 lb

## 2018-10-17 DIAGNOSIS — R059 Cough, unspecified: Secondary | ICD-10-CM

## 2018-10-17 DIAGNOSIS — R05 Cough: Secondary | ICD-10-CM

## 2018-10-17 MED ORDER — METHYLPREDNISOLONE ACETATE 80 MG/ML IJ SUSP
80.0000 mg | Freq: Once | INTRAMUSCULAR | Status: AC
  Administered 2018-10-17: 80 mg via INTRAMUSCULAR

## 2018-10-17 MED ORDER — AZITHROMYCIN 250 MG PO TABS: ORAL_TABLET | ORAL | 0 refills | Status: DC

## 2018-10-17 NOTE — Progress Notes (Signed)
Phyllis Harris is a 38 y.o. female here to Establish Care.  I acted as a Education administrator for [employer], PA-C Anselmo Pickler, LPN  History of Present Illness:   Chief Complaint  Patient presents with  . Establish Care  . Cough    Acute Concerns: Cough -- 3 weeks, no fever, exposed to multiple sick kids at school. Her daughter was recently diagnosed with URI, treated with azithromycin and prednisone and is feeling much better. Denies: fever, chills, SOB. Has had some intermittent nasal congestion, slight sore throat and ear pressure. Has tried multiple OTC cold medications without improvement.  Weight -- Weight: 209 lb 4 oz (94.9 kg)   Depression screen [facility] 2/9 10/17/2018  Decreased Interest 0  Down, Depressed, Hopeless 0  PHQ - 2 Score 0    No flowsheet data found.   Other providers/specialists: Patient Care Team: Inda Coke, [place] as PCP - General (Physician Assistant) Jacelyn Pi, MD as Referring Physician (Endocrinology) Everlene Farrier, MD as Consulting Physician (Obstetrics and Gynecology)   Past Medical History:  Diagnosis Date  . Cancer [facility]) 2008   thyroid   . GERD (gastroesophageal reflux disease)    with pregnancy  . Headache(784.0)    occ  . History of chicken pox   . Hypothyroidism      Social History   Socioeconomic History  . Marital status: Married    Spouse name: Not on file  . Number of children: Not on file  . Years of education: Not on file  . Highest education level: Not on file  Occupational History  . Not on file  Social Needs  . Financial resource strain: Not on file  . Food insecurity:    Worry: Not on file    Inability: Not on file  . Transportation needs:    Medical: Not on file    Non-medical: Not on file  Tobacco Use  . Smoking status: Never Smoker  . Smokeless tobacco: Never Used  Substance and Sexual Activity  . Alcohol use: Yes    Comment: occassional  . Drug use: No  . Sexual activity: Yes    Birth  control/protection: I.U.D.  Lifestyle  . Physical activity:    Days per week: Not on file    Minutes per session: Not on file  . Stress: Not on file  Relationships  . Social connections:    Talks on phone: Not on file    Gets together: Not on file    Attends religious service: Not on file    Active member of club or organization: Not on file    Attends meetings of clubs or organizations: Not on file    Relationship status: Not on file  . Intimate partner violence:    Fear of current or ex partner: Not on file    Emotionally abused: Not on file    Physically abused: Not on file    Forced sexual activity: Not on file  Other Topics Concern  . 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 CESAREAN SECTION  2010  . CESAREAN SECTION N/A 05/14/2014   Procedure: CESAREAN SECTION;  Surgeon: Cyril Mourning, MD;  Location: [place] [facility];  Service: Obstetrics;  Laterality: N/A;  . DILATION AND CURETTAGE OF UTERUS     miscarriage  . THYROIDECTOMY  Family History  Problem Relation Age of Onset  . Thyroid cancer Mother   . Diabetes Mother   . Hyperlipidemia Mother   . Kidney disease Mother   . COPD Father   . Hearing loss Father   . Heart attack Maternal Grandfather   . Heart attack Paternal Grandfather   . Breast cancer Neg Hx   . Colon cancer Neg Hx     No Known Allergies   Current Medications:   Current Outpatient Medications:  .  levonorgestrel (MIRENA) 20 MCG/24HR IUD, 1 each by Intrauterine route once. Inserted by GYN Dr. Gaetano Net 11/04/2017, needs to be removed in 10/2022., Disp: , Rfl:  .  levothyroxine (SYNTHROID, LEVOTHROID) 175 MCG tablet, Take by mouth., Disp: , Rfl:  .  Multiple Vitamin (MULTIVITAMIN) tablet, Take 1 tablet by mouth daily., Disp: , Rfl:  .  azithromycin (ZITHROMAX) 250 MG tablet, Take two tablets on day 1, then one tablet daily x  4 days, Disp: 6 tablet, Rfl: 0   Review of Systems:   Review of Systems  Constitutional: Negative.  Negative for chills, fever, malaise/fatigue and weight loss.  HENT: Negative.  Negative for hearing loss, sinus pain and sore throat.   Eyes: Negative.  Negative for blurred vision.  Respiratory: Positive for cough and sputum production (yellow). Negative for shortness of breath and wheezing.   Cardiovascular: Negative.  Negative for chest pain, palpitations and leg swelling.  Gastrointestinal: Negative.  Negative for abdominal pain, constipation, diarrhea, heartburn, nausea and vomiting.  Genitourinary: Negative.  Negative for dysuria, frequency and urgency.  Musculoskeletal: Negative.  Negative for back pain, myalgias and neck pain.  Skin: Negative.  Negative for itching and rash.  Neurological: Negative.  Negative for dizziness, tingling, seizures, loss of consciousness and headaches.  Endo/Heme/Allergies: Negative.  Negative for polydipsia.  Psychiatric/Behavioral: Negative.  Negative for depression. The patient is not nervous/anxious.     Vitals:   Vitals:   10/17/18 1048  BP: 110/70  Pulse: 70  Temp: 98.2 F (36.8 C)  TempSrc: Oral  SpO2: 96%  Weight: 209 lb 4 oz (94.9 kg)  Height: 5\' 8"  (1.727 m)      Body mass index is 31.82 kg/m.  Physical Exam:   Physical Exam Vitals signs and nursing note reviewed.  Constitutional:      General: She is not in acute distress.    Appearance: She is well-developed. She is not ill-appearing or toxic-appearing.  HENT:     Mouth/Throat:     Lips: Pink.     Mouth: Mucous membranes are moist.     Pharynx: Oropharynx is clear.     Tonsils: Tonsillar exudate present. Swelling: 1+ on the right. 1+ on the left.  Cardiovascular:     Rate and Rhythm: Normal rate and regular rhythm.     Pulses: Normal pulses.     Heart sounds: Normal heart sounds, S1 normal and S2 normal.     Comments: No LE edema Pulmonary:     Effort: Pulmonary  effort is normal.     Breath sounds: Normal breath sounds.  Skin:    General: Skin is warm and dry.  Neurological:     Mental Status: She is alert.     GCS: GCS eye subscore is 4. GCS verbal subscore is 5. GCS motor subscore is 6.  Psychiatric:        Speech: Speech normal.        Behavior: Behavior normal. Behavior is cooperative.     Assessment and  Plan:   Kendalyn was seen today for establish care and cough.  Diagnoses and all orders for this visit:  Cough -     methylPREDNISolone acetate (DEPO-MEDROL) injection 80 mg  Other orders -     azithromycin (ZITHROMAX) 250 MG tablet; Take two tablets on day 1, then one tablet daily x 4 days   No red flags on exam.  Will initiate Azithromycin per orders. She tolerated depo-medrol injection.  Discussed taking medications as prescribed. Reviewed return precautions including worsening fever, SOB, worsening cough or other concerns. Push fluids and rest. I recommend that patient follow-up if symptoms worsen or persist despite treatment x 7-10 days, sooner if needed.   . Reviewed expectations re: course of current medical issues. . Discussed self-management of symptoms. . Outlined signs and symptoms indicating need for more acute intervention. . Patient verbalized understanding and all questions were answered. . See orders for this visit as documented in the electronic medical record. . Patient received an After-Visit Summary.  CMA or LPN served as scribe during this visit. History, Physical, and Plan performed by medical provider. The above documentation has been reviewed and is accurate and complete.   Inda Coke, PA-C

## 2018-10-17 NOTE — Patient Instructions (Signed)
It was great to see you!  Start azithromycin  Push fluids and get plenty of rest. Please return if you are not improving as expected, or if you have high fevers (>101.5) or difficulty swallowing or worsening productive cough.  Call clinic with questions.  I hope you start feeling better soon!

## 2018-10-27 DIAGNOSIS — E890 Postprocedural hypothyroidism: Secondary | ICD-10-CM | POA: Diagnosis not present

## 2018-10-27 DIAGNOSIS — R7301 Impaired fasting glucose: Secondary | ICD-10-CM | POA: Diagnosis not present

## 2018-10-27 DIAGNOSIS — C73 Malignant neoplasm of thyroid gland: Secondary | ICD-10-CM | POA: Diagnosis not present

## 2018-10-31 ENCOUNTER — Encounter: Payer: Self-pay | Admitting: Physician Assistant

## 2018-11-01 ENCOUNTER — Ambulatory Visit: Payer: BLUE CROSS/BLUE SHIELD | Admitting: Physician Assistant

## 2018-11-01 ENCOUNTER — Encounter: Payer: Self-pay | Admitting: Physician Assistant

## 2018-11-01 ENCOUNTER — Ambulatory Visit (INDEPENDENT_AMBULATORY_CARE_PROVIDER_SITE_OTHER): Payer: BLUE CROSS/BLUE SHIELD

## 2018-11-01 VITALS — BP 118/70 | HR 66 | Temp 99.3°F | Ht 68.0 in | Wt 212.4 lb

## 2018-11-01 DIAGNOSIS — R053 Chronic cough: Secondary | ICD-10-CM

## 2018-11-01 DIAGNOSIS — R05 Cough: Secondary | ICD-10-CM

## 2018-11-01 DIAGNOSIS — J984 Other disorders of lung: Secondary | ICD-10-CM | POA: Diagnosis not present

## 2018-11-01 MED ORDER — BUDESONIDE-FORMOTEROL FUMARATE 160-4.5 MCG/ACT IN AERO: 2.0000 | INHALATION_SPRAY | Freq: Two times a day (BID) | RESPIRATORY_TRACT | 3 refills | Status: DC

## 2018-11-01 NOTE — Progress Notes (Signed)
Phyllis Harris is a 38 y.o. female here for a follow up of a pre-existing problem.  I acted as a Education administrator for [employer], PA-C Anselmo Pickler, LPN  History of Present Illness:   Chief Complaint  Patient presents with  . Cough    Cough  This is a recurrent problem. Episode onset: Started 5 weeks ago, was seen last on 1/7 no better. The problem has been unchanged. The cough is productive of sputum (expectorating yellow sputum). Pertinent negatives include no chills, ear congestion, ear pain, fever, headaches, nasal congestion, postnasal drip, sore throat, shortness of breath or wheezing. She has tried oral steroids and OTC cough suppressant (Finished Z-pak, using Robitussin and Mucinex) for the symptoms. The treatment provided no relief. There is no history of asthma, bronchitis or pneumonia.   Received depomedrol injection at the office during her last visit on 10/17/18.  Past Medical History:  Diagnosis Date  . Cancer [facility]) 2008   thyroid   . GERD (gastroesophageal reflux disease)    with pregnancy  . Headache(784.0)    occ  . History of chicken pox   . Hypothyroidism      Social History   Socioeconomic History  . Marital status: Married    Spouse name: Not on file  . Number of children: Not on file  . Years of education: Not on file  . Highest education level: Not on file  Occupational History  . Not on file  Social Needs  . Financial resource strain: Not on file  . Food insecurity:    Worry: Not on file    Inability: Not on file  . Transportation needs:    Medical: Not on file    Non-medical: Not on file  Tobacco Use  . Smoking status: Never Smoker  . Smokeless tobacco: Never Used  Substance and Sexual Activity  . Alcohol use: Yes    Comment: occassional  . Drug use: No  . Sexual activity: Yes    Birth control/protection: I.U.D.  Lifestyle  . Physical activity:    Days per week: Not on file    Minutes per session: Not on file  . Stress: Not on file   Relationships  . Social connections:    Talks on phone: Not on file    Gets together: Not on file    Attends religious service: Not on file    Active member of club or organization: Not on file    Attends meetings of clubs or organizations: Not on file    Relationship status: Not on file  . Intimate partner violence:    Fear of current or ex partner: Not on file    Emotionally abused: Not on file    Physically abused: Not on file    Forced sexual activity: Not on file  Other Topics Concern  . 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 CESAREAN SECTION  2010  . CESAREAN SECTION N/A 05/14/2014   Procedure: CESAREAN SECTION;  Surgeon: Cyril Mourning, MD;  [facility];  Service: Obstetrics;  Laterality: N/A;  . DILATION AND CURETTAGE OF UTERUS     miscarriage  . THYROIDECTOMY      Family History  Problem Relation Age of Onset  . Thyroid cancer Mother   . Diabetes Mother   . Hyperlipidemia Mother   .  Kidney disease Mother   . COPD Father   . Hearing loss Father   . Heart attack Maternal Grandfather   . Heart attack Paternal Grandfather   . Breast cancer Neg Hx   . Colon cancer Neg Hx     No Known Allergies  Current Medications:   Current Outpatient Medications:  .  levonorgestrel (MIRENA) 20 MCG/24HR IUD, 1 each by Intrauterine route once. Inserted by GYN Dr. Gaetano Net 11/04/2017, needs to be removed in 10/2022., Disp: , Rfl:  .  levothyroxine (SYNTHROID, LEVOTHROID) 175 MCG tablet, Take by mouth., Disp: , Rfl:  .  Multiple Vitamin (MULTIVITAMIN) tablet, Take 1 tablet by mouth daily., Disp: , Rfl:  .  budesonide-formoterol (SYMBICORT) 160-4.5 MCG/ACT inhaler, Inhale 2 puffs into the lungs 2 (two) times daily., Disp: 1 Inhaler, Rfl: 3   Review of Systems:   Review of Systems  Constitutional: Negative for chills and fever.  HENT: Negative  for ear pain, postnasal drip and sore throat.   Respiratory: Positive for cough. Negative for shortness of breath and wheezing.   Neurological: Negative for headaches.    Vitals:   Vitals:   11/01/18 1543  BP: 118/70  Pulse: 66  Temp: 99.3 F (37.4 C)  TempSrc: Oral  SpO2: 96%  Weight: 212 lb 6.1 oz (96.3 kg)  Height: 5\' 8"  (1.727 m)     Body mass index is 32.29 kg/m.  Physical Exam:   Physical Exam Vitals signs and nursing note reviewed.  Constitutional:      General: She is not in acute distress.    Appearance: She is well-developed. She is not ill-appearing or toxic-appearing.  HENT:     Head: Normocephalic and atraumatic.     Right Ear: Ear canal and external ear normal. A middle ear effusion is present. Tympanic membrane is not erythematous, retracted or bulging.     Left Ear: Ear canal and external ear normal. A middle ear effusion is present. Tympanic membrane is not erythematous, retracted or bulging.     Ears:     Comments: Bilateral ears with clear fluid behind TM    Nose: Nose normal.     Right Sinus: No maxillary sinus tenderness or frontal sinus tenderness.     Left Sinus: No maxillary sinus tenderness or frontal sinus tenderness.     Mouth/Throat:     Pharynx: Uvula midline. No posterior oropharyngeal erythema.  Eyes:     General: Lids are normal.     Conjunctiva/sclera: Conjunctivae normal.  Neck:     Trachea: Trachea normal.  Cardiovascular:     Rate and Rhythm: Normal rate and regular rhythm.     Heart sounds: Normal heart sounds, S1 normal and S2 normal.  Pulmonary:     Effort: Pulmonary effort is normal.     Breath sounds: Normal breath sounds. No decreased breath sounds, wheezing, rhonchi or rales.  Lymphadenopathy:     Cervical: No cervical adenopathy.  Skin:    General: Skin is warm and dry.  Neurological:     Mental Status: She is alert.  Psychiatric:        Speech: Speech normal.        Behavior: Behavior normal. Behavior is  cooperative.    CXR: read pending  Assessment and Plan:   [name] was seen today for cough.  Diagnoses and all orders for this visit:  Chronic cough -     DG Chest 2 View; Future  Other orders -     budesonide-formoterol (SYMBICORT) 160-4.5  MCG/ACT inhaler; Inhale 2 puffs into the lungs 2 (two) times daily.   No red flags on exam. Suspect post-viral cough. Start inhaled symbicort and daily antihistamine. CXR pending. Discussed taking medications as prescribed. Reviewed return precautions including worsening fever, SOB, worsening cough or other concerns. Push fluids and rest. I recommend that patient follow-up if symptoms worsen or persist despite treatment x 7-10 days, sooner if needed.  . Reviewed expectations re: course of current medical issues. . Discussed self-management of symptoms. . Outlined signs and symptoms indicating need for more acute intervention. . Patient verbalized understanding and all questions were answered. . See orders for this visit as documented in the electronic medical record. . Patient received an After-Visit Summary.  CMA or LPN served as scribe during this visit. History, Physical, and Plan performed by medical provider. The above documentation has been reviewed and is accurate and complete.   Inda Coke, PA-C

## 2018-11-01 NOTE — Patient Instructions (Signed)
It was great to see you!  Start daily antihistamine.  Push fluids.  Use symbicort inhaler in AM and PM.  I'll be in touch with your xray when it returns.  Take care,  Inda Coke PA-C

## 2018-11-02 ENCOUNTER — Encounter: Payer: Self-pay | Admitting: Physician Assistant

## 2018-12-15 DIAGNOSIS — Z20828 Contact with and (suspected) exposure to other viral communicable diseases: Secondary | ICD-10-CM | POA: Diagnosis not present

## 2018-12-15 DIAGNOSIS — Z01419 Encounter for gynecological examination (general) (routine) without abnormal findings: Secondary | ICD-10-CM | POA: Diagnosis not present

## 2018-12-15 DIAGNOSIS — H6691 Otitis media, unspecified, right ear: Secondary | ICD-10-CM | POA: Diagnosis not present

## 2018-12-15 DIAGNOSIS — Z6832 Body mass index (BMI) 32.0-32.9, adult: Secondary | ICD-10-CM | POA: Diagnosis not present

## 2018-12-15 DIAGNOSIS — J320 Chronic maxillary sinusitis: Secondary | ICD-10-CM | POA: Diagnosis not present

## 2018-12-23 ENCOUNTER — Other Ambulatory Visit: Payer: Self-pay | Admitting: Physician Assistant

## 2018-12-29 ENCOUNTER — Encounter: Payer: Self-pay | Admitting: Family Medicine

## 2018-12-29 ENCOUNTER — Ambulatory Visit (INDEPENDENT_AMBULATORY_CARE_PROVIDER_SITE_OTHER): Payer: BLUE CROSS/BLUE SHIELD | Admitting: Family Medicine

## 2018-12-29 VITALS — BP 118/74 | HR 71 | Temp 98.3°F | Ht 68.0 in | Wt 214.8 lb

## 2018-12-29 DIAGNOSIS — J329 Chronic sinusitis, unspecified: Secondary | ICD-10-CM

## 2018-12-29 DIAGNOSIS — J3489 Other specified disorders of nose and nasal sinuses: Secondary | ICD-10-CM

## 2018-12-29 MED ORDER — AZITHROMYCIN 250 MG PO TABS: ORAL_TABLET | ORAL | 0 refills | Status: DC

## 2018-12-29 MED ORDER — METHYLPREDNISOLONE ACETATE 80 MG/ML IJ SUSP
80.0000 mg | Freq: Once | INTRAMUSCULAR | Status: AC
  Administered 2018-12-29: 80 mg via INTRAMUSCULAR

## 2018-12-29 MED ORDER — AZELASTINE HCL 0.1 % NA SOLN: 2.0000 | Freq: Two times a day (BID) | NASAL | 12 refills | Status: DC

## 2018-12-29 NOTE — Patient Instructions (Signed)
Please start the Astelin and Z-Pak.  We will also give you a steroid injection today.  Please let Samantha or Dr. Jerline Pain know if you aren't feeling better.

## 2018-12-29 NOTE — Progress Notes (Signed)
   Chief Complaint:  Phyllis Harris is a 38 y.o. female who presents for same day appointment with a chief complaint of sinus congestion.   Assessment/Plan:  Sinusitis / Rhinorrhea Likely secondary to viral URI. Low risk for COVID19- no indication for testing. Start astelin and azithromycin.  Will give 80mg  IM depo-medrol.   Recommended tylenol and/or motrin as needed for low grade fever and pain. Encouraged good oral hydration. Return precautions reviewed. Follow up as needed.     Subjective:  HPI:  Sinus Congestion, acute problem Started 2 weeks ago. Worsening. Was seen at [facility] and prescribed a course of amoxicillin.  Symptoms did not improve.  Husband is been sick with similar symptoms.  No fever.  No cough.  She has had some right-sided ear pressure.  No specific treatments tried.  No other obvious alleviating or aggravating factors.    ROS: Per HPI  PMH: She reports that she has never smoked. She has never used smokeless tobacco. She reports current alcohol use. She reports that she does not use drugs.      Objective:  Physical Exam: BP 118/74 (BP Location: Left Arm, Patient Position: Sitting, Cuff Size: Normal)   Pulse 71   Temp 98.3 F (36.8 C) (Oral)   Ht 5\' 8"  (1.727 m)   Wt 214 lb 12.8 oz (97.4 kg)   SpO2 98%   BMI 32.66 kg/m   Gen: NAD, resting comfortably HEENT: Right TM erythematous with effusion. Left TM clear. OP erythematous with no exudates. Nasal mucosa erythematous and boggy with clear discharge. CV: Regular rate and rhythm with no murmurs appreciated Pulm: Normal work of breathing, clear to auscultation bilaterally with no crackles, wheezes, or rhonchi      Phyllis Harris M. Jerline Pain, MD 12/29/2018 3:05 PM

## 2019-01-09 ENCOUNTER — Other Ambulatory Visit: Payer: Self-pay | Admitting: *Deleted

## 2019-01-09 DIAGNOSIS — F330 Major depressive disorder, recurrent, mild: Secondary | ICD-10-CM | POA: Diagnosis not present

## 2019-01-09 MED ORDER — BUDESONIDE-FORMOTEROL FUMARATE 160-4.5 MCG/ACT IN AERO: INHALATION_SPRAY | RESPIRATORY_TRACT | 1 refills | Status: DC

## 2019-01-23 DIAGNOSIS — F330 Major depressive disorder, recurrent, mild: Secondary | ICD-10-CM | POA: Diagnosis not present

## 2019-02-06 DIAGNOSIS — F330 Major depressive disorder, recurrent, mild: Secondary | ICD-10-CM | POA: Diagnosis not present

## 2019-03-06 DIAGNOSIS — F330 Major depressive disorder, recurrent, mild: Secondary | ICD-10-CM | POA: Diagnosis not present

## 2019-03-21 DIAGNOSIS — F330 Major depressive disorder, recurrent, mild: Secondary | ICD-10-CM | POA: Diagnosis not present

## 2019-04-03 DIAGNOSIS — F330 Major depressive disorder, recurrent, mild: Secondary | ICD-10-CM | POA: Diagnosis not present

## 2019-04-17 DIAGNOSIS — F330 Major depressive disorder, recurrent, mild: Secondary | ICD-10-CM | POA: Diagnosis not present

## 2019-04-26 DIAGNOSIS — E890 Postprocedural hypothyroidism: Secondary | ICD-10-CM | POA: Diagnosis not present

## 2019-04-26 DIAGNOSIS — C73 Malignant neoplasm of thyroid gland: Secondary | ICD-10-CM | POA: Diagnosis not present

## 2019-04-26 DIAGNOSIS — Z03818 Encounter for observation for suspected exposure to other biological agents ruled out: Secondary | ICD-10-CM | POA: Diagnosis not present

## 2019-05-16 DIAGNOSIS — F330 Major depressive disorder, recurrent, mild: Secondary | ICD-10-CM | POA: Diagnosis not present

## 2019-06-21 DIAGNOSIS — F330 Major depressive disorder, recurrent, mild: Secondary | ICD-10-CM | POA: Diagnosis not present

## 2019-07-27 DIAGNOSIS — F330 Major depressive disorder, recurrent, mild: Secondary | ICD-10-CM | POA: Diagnosis not present

## 2019-08-23 DIAGNOSIS — F330 Major depressive disorder, recurrent, mild: Secondary | ICD-10-CM | POA: Diagnosis not present

## 2019-09-10 ENCOUNTER — Encounter: Payer: Self-pay | Admitting: Physician Assistant

## 2019-09-10 DIAGNOSIS — R7301 Impaired fasting glucose: Secondary | ICD-10-CM | POA: Diagnosis not present

## 2019-09-10 DIAGNOSIS — C73 Malignant neoplasm of thyroid gland: Secondary | ICD-10-CM | POA: Diagnosis not present

## 2019-09-10 DIAGNOSIS — E890 Postprocedural hypothyroidism: Secondary | ICD-10-CM | POA: Diagnosis not present

## 2020-02-06 ENCOUNTER — Other Ambulatory Visit: Payer: Self-pay | Admitting: Endocrinology

## 2020-02-06 DIAGNOSIS — C73 Malignant neoplasm of thyroid gland: Secondary | ICD-10-CM

## 2020-02-15 ENCOUNTER — Ambulatory Visit
Admission: RE | Admit: 2020-02-15 | Discharge: 2020-02-15 | Disposition: A | Discharge status: Home / Self Care | Payer: No Typology Code available for payment source | Source: Ambulatory Visit | Attending: Endocrinology | Admitting: Endocrinology

## 2020-02-15 DIAGNOSIS — C73 Malignant neoplasm of thyroid gland: Secondary | ICD-10-CM

## 2020-10-11 HISTORY — PX: MASTECTOMY: SHX3

## 2020-12-12 ENCOUNTER — Other Ambulatory Visit: Payer: Self-pay

## 2020-12-12 DIAGNOSIS — N6310 Unspecified lump in the right breast, unspecified quadrant: Secondary | ICD-10-CM

## 2020-12-16 ENCOUNTER — Ambulatory Visit: Payer: Medicaid Other

## 2020-12-25 ENCOUNTER — Ambulatory Visit
Admission: RE | Admit: 2020-12-25 | Discharge: 2020-12-25 | Disposition: A | Discharge status: Home / Self Care | Payer: No Typology Code available for payment source | Source: Ambulatory Visit | Attending: Obstetrics and Gynecology | Admitting: Obstetrics and Gynecology

## 2020-12-25 ENCOUNTER — Other Ambulatory Visit: Payer: Self-pay

## 2020-12-25 ENCOUNTER — Ambulatory Visit: Payer: Medicaid Other | Admitting: *Deleted

## 2020-12-25 ENCOUNTER — Ambulatory Visit
Admission: RE | Admit: 2020-12-25 | Discharge: 2020-12-25 | Disposition: A | Discharge status: Home / Self Care | Payer: Medicaid Other | Source: Ambulatory Visit | Attending: Obstetrics and Gynecology | Admitting: Obstetrics and Gynecology

## 2020-12-25 ENCOUNTER — Other Ambulatory Visit: Payer: Self-pay | Admitting: Obstetrics and Gynecology

## 2020-12-25 VITALS — BP 136/80 | Wt 215.2 lb

## 2020-12-25 DIAGNOSIS — Z1239 Encounter for other screening for malignant neoplasm of breast: Secondary | ICD-10-CM

## 2020-12-25 DIAGNOSIS — N6310 Unspecified lump in the right breast, unspecified quadrant: Secondary | ICD-10-CM

## 2020-12-25 DIAGNOSIS — N6311 Unspecified lump in the right breast, upper outer quadrant: Secondary | ICD-10-CM

## 2020-12-25 NOTE — Progress Notes (Addendum)
Ms. ARLENY KRUGER is a 40 y.o. female who presents to [facility] clinic today with complaint of right breast lump x one month that is painful. Patient states the pain comes and goes. Patient rates the pain at a 3 out of 10.    Pap Smear: Pap smear not completed today. Last Pap smear was 10/20/2017 at [facility] and was normal with negative HPV. Per patient has no history of an abnormal Pap smear. Last Pap smear result is available in Epic.   Physical exam: Breasts Breasts symmetrical. No skin abnormalities bilateral breasts. No nipple retraction bilateral breasts. No nipple discharge bilateral breasts. No lymphadenopathy. No lumps palpated left breast. Palpated a pea sized lump within the right breast at 11 o'clock next to areola. No complaints of pain or tenderness on exam.      Pelvic/Bimanual Pap is not indicated today per BCCCP guidelines.   Smoking History: Patient has never smoked.   Patient Navigation: Patient education provided. Access to services provided for patient through BCCCP program.    Breast and Cervical Cancer Risk Assessment: Patient does not have family history of breast cancer, known genetic mutations, or radiation treatment to the chest before age 30. Patient does not have history of cervical dysplasia, immunocompromised, or DES exposure in-utero.  Risk Assessment    Risk Scores      12/25/2020   Last edited by: Royston Bake, CMA   5-year risk: 0.7 %   Lifetime risk: 10.9 %         A: BCCCP exam without pap smear Complaint of right breast lump and pain.  P: Referred patient to the [place] for a diagnostic mammogram. Appointment scheduled Thursday, December 25, 2020 at 1310.  Loletta Parish, RN 12/25/2020 8:27 AM

## 2020-12-25 NOTE — Patient Instructions (Signed)
Explained breast self awareness with Gerhard Munch. Patient did not need a Pap smear today due to last Pap smear and HPV typing was 10/20/2017. Let her know BCCCP will cover Pap smears and HPV typing every 5 years unless has a history of abnormal Pap smears. Referred patient to the [facility] for a diagnostic mammogram. Appointment scheduled Thursday, December 25, 2020 at 1310. Patient aware of appointment and will be there. Gerhard Munch verbalized understanding.  Gladie Gravette, Arvil [place], RN 8:27 AM

## 2020-12-26 ENCOUNTER — Ambulatory Visit
Admission: RE | Admit: 2020-12-26 | Discharge: 2020-12-26 | Disposition: A | Discharge status: Home / Self Care | Payer: No Typology Code available for payment source | Source: Ambulatory Visit | Attending: Obstetrics and Gynecology | Admitting: Obstetrics and Gynecology

## 2020-12-26 DIAGNOSIS — N6310 Unspecified lump in the right breast, unspecified quadrant: Secondary | ICD-10-CM

## 2020-12-29 ENCOUNTER — Telehealth: Payer: Self-pay | Admitting: *Deleted

## 2020-12-29 ENCOUNTER — Encounter: Payer: Self-pay | Admitting: *Deleted

## 2020-12-29 ENCOUNTER — Other Ambulatory Visit: Payer: Self-pay | Admitting: Obstetrics and Gynecology

## 2020-12-29 DIAGNOSIS — C50919 Malignant neoplasm of unspecified site of unspecified female breast: Secondary | ICD-10-CM

## 2020-12-29 NOTE — Telephone Encounter (Signed)
Left vm for pt to return call to provide navigation resources and provided contact information. Gave appt to see Dr. Donne Hazel on 3/31 at 7:45am along with address. Encourage pt to return call.

## 2020-12-29 NOTE — Telephone Encounter (Signed)
Spoke to pt. Provided navigation resources and contact information. Confirmed appt with Dr. Donne Hazel on 3/31 at 7:45am. Gave directions.

## 2020-12-30 ENCOUNTER — Other Ambulatory Visit: Payer: Self-pay | Admitting: *Deleted

## 2020-12-30 ENCOUNTER — Telehealth: Payer: Self-pay | Admitting: *Deleted

## 2020-12-30 DIAGNOSIS — C50411 Malignant neoplasm of upper-outer quadrant of right female breast: Secondary | ICD-10-CM

## 2020-12-30 NOTE — Telephone Encounter (Signed)
Spoke to pt regarding appts for 3/24. Scheduled and confirmed appt for genetics, labs, and Dr. Lindi Adie Discussed CT and breast MRI, confirmed appts and gave instructions on contrast.  No further needs voiced at this time

## 2020-12-31 ENCOUNTER — Other Ambulatory Visit: Payer: Self-pay | Admitting: *Deleted

## 2020-12-31 DIAGNOSIS — C50411 Malignant neoplasm of upper-outer quadrant of right female breast: Secondary | ICD-10-CM

## 2020-12-31 NOTE — Progress Notes (Addendum)
[facility] CONSULT NOTE  Patient Care Team: Inda Coke, [place] as PCP - General (Physician Assistant) Jacelyn Pi, MD as Referring Physician (Endocrinology) Everlene Farrier, MD as Consulting Physician (Obstetrics and Gynecology) Mauro Kaufmann, RN as Oncology Nurse Navigator Rockwell Germany, RN as Oncology Nurse Navigator  CHIEF COMPLAINTS/PURPOSE OF CONSULTATION:  Newly diagnosed breast cancer  HISTORY OF PRESENTING ILLNESS:  Phyllis Harris 40 y.o. female is here because of recent diagnosis of invasive mammary carcinoma of the right breast. A right breast mass was palpated on clinical exam. Diagnostic mammogram and [place] on 12/25/20 showed calcifications in the upper outer and upper inner right breast, 9.7cm, and multiple masses from the 9-2 o'clock position, 1.8cm, 3.3cm, 1.1cm, 2.8cm, 0.8cm, and 2 abnormal lymph nodes in the right axilla. Biopsy on 12/26/20 showed invasive mammary carcinoma in the breast and axilla, grade 2. She presents to the clinic today for initial evaluation and discussion of treatment options.   I reviewed her records extensively and collaborated the history with the patient.  SUMMARY OF ONCOLOGIC HISTORY: Oncology History  Malignant neoplasm of upper-outer quadrant of right female breast ([place])  12/30/2020 Initial Diagnosis   A right breast mass was palpated on clinical exam. Diagnostic mammogram and US showed calcifications in the upper outer and upper inner right breast, 9.7cm, and multiple masses from the 9-2 o'clock position, 1.8cm, 3.3cm, 1.1cm, 2.8cm, 0.8cm, and 2 abnormal lymph nodes in the right axilla. Biopsy showed invasive mammary carcinoma in the breast and axilla, grade 2. ER 100%, PR 100%, Ki-67 20%, HER-2 negative   01/01/2021 Cancer Staging   Staging form: Breast, AJCC 8th Edition - Clinical stage from 01/01/2021: Stage IIA (cT3, cN1, cM0, G2, ER+, PR+, HER2-) - Signed by Nicholas Lose, MD on 01/01/2021 Stage prefix: Initial  diagnosis Histologic grading system: 3 grade system     MEDICAL HISTORY:  Past Medical History:  Diagnosis Date  . Cancer [facility]) 2008   thyroid   . GERD (gastroesophageal reflux disease)    with pregnancy  . Headache(784.0)    occ  . History of chicken pox   . Hypothyroidism     SURGICAL HISTORY: Past Surgical History:  Procedure Laterality Date  . CESAREAN SECTION  2010  . CESAREAN SECTION N/A 05/14/2014   Procedure: CESAREAN SECTION;  Surgeon: Cyril Mourning, MD;  Location: [place] [facility];  Service: Obstetrics;  Laterality: N/A;  . DILATION AND CURETTAGE OF UTERUS     miscarriage  . THYROIDECTOMY      SOCIAL HISTORY: Social History   Socioeconomic History  . Marital status: Married    Spouse name: Not on file  . Number of children: Not on file  . Years of education: Not on file  . Highest education level: Not on file  Occupational History  . Not on file  Tobacco Use  . Smoking status: Never Smoker  . Smokeless tobacco: Never Used  Vaping Use  . Vaping Use: Never used  Substance and Sexual Activity  . Alcohol use: Yes    Comment: occassional  . Drug use: No  . Sexual activity: Yes    Birth control/protection: I.U.D.  Other Topics Concern  . 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 Lack of  Transportation (Medical): No  . Lack of Transportation (Non-Medical): No  Physical Activity: Not on file  Stress: Not on file  Social Connections: Not on file  Intimate Partner Violence: Not on file    FAMILY HISTORY: Family History  Problem Relation Age of Onset  . Thyroid cancer Mother   . Diabetes Mother   . Hyperlipidemia Mother   . Kidney disease Mother   . COPD Father   . Hearing loss Father   . Heart attack Maternal Grandfather   .  Heart attack Paternal Grandfather   . Breast cancer Neg Hx   . Colon cancer Neg Hx     ALLERGIES:  has No Known Allergies.  MEDICATIONS:  Current Outpatient Medications  Medication Sig Dispense Refill  . tamoxifen (NOLVADEX) 20 MG tablet Take 1 tablet (20 mg total) by mouth daily. 30 tablet 1  . levonorgestrel (MIRENA) 20 MCG/24HR IUD 1 each by Intrauterine route once. Inserted by GYN Dr. Gaetano Net 11/04/2017, needs to be removed in 10/2022.    . levothyroxine (SYNTHROID, LEVOTHROID) 175 MCG tablet Take by mouth.    . Multiple Vitamin (MULTIVITAMIN) tablet Take 1 tablet by mouth daily.    . sertraline (ZOLOFT) 50 MG tablet Take 50 mg by mouth daily.     No current facility-administered medications for this visit.    REVIEW OF SYSTEMS:     Breast: Palpable right breast mass with bruising from recent biopsy All other systems were reviewed with the patient and are negative.  PHYSICAL EXAMINATION: ECOG PERFORMANCE STATUS: 1 - Symptomatic but completely ambulatory  Vitals:   01/01/21 1517  BP: 117/67  Pulse: 72  Resp: 20  Temp: 98.1 F (36.7 C)  SpO2: 99%   Filed Weights   01/01/21 1517  Weight: 212 lb 12.8 oz (96.5 kg)      LABORATORY DATA:  I have reviewed the data as listed Lab Results  Component Value Date   WBC 9.9 01/01/2021   HGB 13.2 01/01/2021   HCT 39.9 01/01/2021   MCV 90.3 01/01/2021   PLT 304 01/01/2021   Lab Results  Component Value Date   NA 142 01/01/2021   K 4.2 01/01/2021   CL 103 01/01/2021   CO2 27 01/01/2021    RADIOGRAPHIC STUDIES: I have personally reviewed the radiological reports and agreed with the findings in the report.  ASSESSMENT AND PLAN:  Malignant neoplasm of upper-outer quadrant of right female breast ([place]) 12/30/2020:A right breast mass was palpated on clinical exam. Diagnostic mammogram and US showed calcifications in the upper outer and upper inner right breast, 9.7cm, and multiple masses from the 9-2 o'clock position,  1.8cm, 3.3cm, 1.1cm, 2.8cm, 0.8cm, and 2 abnormal lymph nodes in the right axilla. Biopsy showed invasive mammary carcinoma in the breast and axilla, grade 2. ER 100%, PR 100%, Ki-67 20%, HER-2 negative  Pathology and radiology counseling: Discussed with the patient, the details of pathology including the type of breast cancer,the clinical staging, the significance of ER, PR and HER-2/neu receptors and the implications for treatment. After reviewing the pathology in detail, we proceeded to discuss the different treatment options between surgery, radiation, chemotherapy, antiestrogen therapies.  Treatment plan: 1.  mastectomy with targeted node dissection 2. greater than 4 lymph nodes: Will need adjuvant chemo with dose dense Adriamycin and Cytoxan followed by Taxol, 1-3 lymph nodes: MammaPrint testing to determine benefit of chemo 3.  Adjuvant radiation therapy 4.  Followed by adjuvant antiestrogen therapy (I started her on tamoxifen preoperatively, after surgery and  radiation I plan to discuss ovarian function suppression with anastrozole)  We will obtain CT chest abdomen pelvis and bone scan and genetic testing. I recommended her husband also to get genetic testing because of his family risk. -------------------------------------------------------------------------------------------------------- Mammaprint counseling: MINDACT is a prospective, randomized phase III controlled trial that investigates the clinical utility of MammaPrint, when compared to standard clinical pathological criteria, with 6,693 patients enrolled from over 111 institutions. Clinical high-risk patients with a Low Risk MammaPrint result, including 48% node-positive, had 5-year distant metastasis-free survival rate in excess of 94 percent, whether randomized to receive adjuvant chemotherapy or not proving MammaPrint's ability to safely identify Low Risk patients.  Tamoxifen counseling: We discussed the risks and benefits of  tamoxifen. These include but not limited to insomnia, hot flashes, mood changes, vaginal dryness, and weight gain. Although rare, serious side effects including endometrial cancer, risk of blood clots were also discussed. We strongly believe that the benefits far outweigh the risks. Patient understands these risks and consented to starting treatment.    She will stop tamoxifen 1 week before surgery. Return to clinic after surgery to discuss the final pathology report   All questions were answered. The patient knows to call the clinic with any problems, questions or concerns.   Rulon Eisenmenger, MD, MPH 01/01/2021    I, Molly Dorshimer, am acting as scribe for Nicholas Lose, MD.  I have reviewed the above documentation for accuracy and completeness, and I agree with the above.

## 2021-01-01 ENCOUNTER — Other Ambulatory Visit: Payer: Self-pay | Admitting: *Deleted

## 2021-01-01 ENCOUNTER — Inpatient Hospital Stay: Payer: Medicaid Other

## 2021-01-01 ENCOUNTER — Inpatient Hospital Stay (HOSPITAL_BASED_OUTPATIENT_CLINIC_OR_DEPARTMENT_OTHER): Payer: Medicaid Other | Admitting: Genetic Counselor

## 2021-01-01 ENCOUNTER — Encounter: Payer: Self-pay | Admitting: *Deleted

## 2021-01-01 ENCOUNTER — Other Ambulatory Visit: Payer: Self-pay

## 2021-01-01 ENCOUNTER — Inpatient Hospital Stay: Payer: Medicaid Other | Attending: Hematology and Oncology | Admitting: Hematology and Oncology

## 2021-01-01 DIAGNOSIS — C50411 Malignant neoplasm of upper-outer quadrant of right female breast: Secondary | ICD-10-CM | POA: Diagnosis present

## 2021-01-01 DIAGNOSIS — K219 Gastro-esophageal reflux disease without esophagitis: Secondary | ICD-10-CM | POA: Diagnosis not present

## 2021-01-01 DIAGNOSIS — Z170 Estrogen receptor positive status [ER+]: Secondary | ICD-10-CM

## 2021-01-01 DIAGNOSIS — E039 Hypothyroidism, unspecified: Secondary | ICD-10-CM | POA: Diagnosis not present

## 2021-01-01 DIAGNOSIS — Z808 Family history of malignant neoplasm of other organs or systems: Secondary | ICD-10-CM

## 2021-01-01 DIAGNOSIS — Z85850 Personal history of malignant neoplasm of thyroid: Secondary | ICD-10-CM

## 2021-01-01 LAB — CMP (CANCER CENTER ONLY)
ALT: 23 U/L (ref 0–44)
AST: 25 U/L (ref 15–41)
Albumin: 4.5 g/dL (ref 3.5–5.0)
Alkaline Phosphatase: 65 U/L (ref 38–126)
Anion gap: 12 (ref 5–15)
BUN: 11 mg/dL (ref 6–20)
CO2: 27 mmol/L (ref 22–32)
Calcium: 8.9 mg/dL (ref 8.9–10.3)
Chloride: 103 mmol/L (ref 98–111)
Creatinine: 0.76 mg/dL (ref 0.44–1.00)
GFR, Estimated: >60 mL/min (ref >60)
Glucose, Bld: 94 mg/dL (ref 70–99)
Potassium: 4.2 mmol/L (ref 3.5–5.1)
Sodium: 142 mmol/L (ref 135–145)
Total Bilirubin: 0.3 mg/dL (ref 0.3–1.2)
Total Protein: 7.8 g/dL (ref 6.5–8.1)

## 2021-01-01 LAB — CBC WITH DIFFERENTIAL (CANCER CENTER ONLY)
Abs Immature Granulocytes: 0.02 10*3/uL (ref 0.00–0.07)
Basophils Absolute: 0.1 10*3/uL (ref 0.0–0.1)
Basophils Relative: 1 %
Eosinophils Absolute: 0.1 10*3/uL (ref 0.0–0.5)
Eosinophils Relative: 1 %
HCT: 39.9 % (ref 36.0–46.0)
Hemoglobin: 13.2 g/dL (ref 12.0–15.0)
Immature Granulocytes: 0 %
Lymphocytes Relative: 21 %
Lymphs Abs: 2 10*3/uL (ref 0.7–4.0)
MCH: 29.9 pg (ref 26.0–34.0)
MCHC: 33.1 g/dL (ref 30.0–36.0)
MCV: 90.3 fL (ref 80.0–100.0)
Monocytes Absolute: 0.7 10*3/uL (ref 0.1–1.0)
Monocytes Relative: 7 %
Neutro Abs: 7 10*3/uL (ref 1.7–7.7)
Neutrophils Relative %: 70 %
Platelet Count: 304 10*3/uL (ref 150–400)
RBC: 4.42 MIL/uL (ref 3.87–5.11)
RDW: 13.1 % (ref 11.5–15.5)
WBC Count: 9.9 10*3/uL (ref 4.0–10.5)
nRBC: 0 % (ref 0.0–0.2)

## 2021-01-01 LAB — GENETIC SCREENING ORDER

## 2021-01-01 MED ORDER — TAMOXIFEN CITRATE 20 MG PO TABS
20.0000 mg | ORAL_TABLET | Freq: Every day | ORAL | 1 refills | Status: DC
Start: 2021-01-01 — End: 2021-01-26

## 2021-01-01 NOTE — Assessment & Plan Note (Addendum)
12/30/2020:A right breast mass was palpated on clinical exam. Diagnostic mammogram and US showed calcifications in the upper outer and upper inner right breast, 9.7cm, and multiple masses from the 9-2 o'clock position, 1.8cm, 3.3cm, 1.1cm, 2.8cm, 0.8cm, and 2 abnormal lymph nodes in the right axilla. Biopsy showed invasive mammary carcinoma in the breast and axilla, grade 2. ER 100%, PR 100%, Ki-67 20%, HER-2 negative  Pathology and radiology counseling: Discussed with the patient, the details of pathology including the type of breast cancer,the clinical staging, the significance of ER, PR and HER-2/neu receptors and the implications for treatment. After reviewing the pathology in detail, we proceeded to discuss the different treatment options between surgery, radiation, chemotherapy, antiestrogen therapies.  Treatment plan: 1.  mastectomy with targeted node dissection 2. greater than 4 lymph nodes: Will need adjuvant chemo with dose dense Adriamycin and Cytoxan followed by Taxol, 1-3 lymph nodes: MammaPrint testing to determine benefit of chemo 3.  Adjuvant radiation therapy 4.  Followed by adjuvant antiestrogen therapy -------------------------------------------------------------------------------------------------------- Mammaprint counseling: MINDACT is a prospective, randomized phase III controlled trial that investigates the clinical utility of MammaPrint, when compared to standard clinical pathological criteria, with 6,693 patients enrolled from over 111 institutions. Clinical high-risk patients with a Low Risk MammaPrint result, including 48% node-positive, had 5-year distant metastasis-free survival rate in excess of 94 percent, whether randomized to receive adjuvant chemotherapy or not proving MammaPrint's ability to safely identify Low Risk patients.  Return to clinic after surgery to discuss the final pathology report

## 2021-01-02 ENCOUNTER — Encounter: Payer: Self-pay | Admitting: Genetic Counselor

## 2021-01-02 DIAGNOSIS — Z808 Family history of malignant neoplasm of other organs or systems: Secondary | ICD-10-CM

## 2021-01-02 DIAGNOSIS — Z85850 Personal history of malignant neoplasm of thyroid: Secondary | ICD-10-CM

## 2021-01-02 HISTORY — DX: Family history of malignant neoplasm of other organs or systems: Z80.8

## 2021-01-02 HISTORY — DX: Personal history of malignant neoplasm of thyroid: Z85.850

## 2021-01-02 NOTE — Progress Notes (Signed)
BCCCP Medicaid application sent to DSS.

## 2021-01-02 NOTE — Progress Notes (Signed)
REFERRING PROVIDER: Nicholas Lose, MD [address] [place],  [place] [address]  PRIMARY PROVIDER:  Inda Coke, PA  PRIMARY REASON FOR VISIT:  1. Malignant neoplasm of upper-outer quadrant of right breast in female, estrogen receptor positive ([place])   2. Family history of thyroid cancer   3. History of thyroid cancer    HISTORY OF PRESENT ILLNESS:   Ms. Vazquez, a 40 y.o. female, was seen for a [place] cancer genetics consultation at the request of Dr. Lindi Adie due to a personal history of cancer.  Ms. Geigle presents to clinic today with her husband, Elta Guadeloupe, to discuss the possibility of a hereditary predisposition to cancer, to discuss genetic testing, and to further clarify her future cancer risks, as well as potential cancer risks for family members.   In March 2022, at the age of 40, Ms. Storlie was diagnosed with invasive ductal carcinoma of the right breast (ER+/PR+/HER2-). The preliminary treatment plan includes surgery, adjuvant chemotherapy if appropriate, adjuvant radiation, and anti-estrogens.  Ms. Lucy also has a history of papillary thyroid cancer diagnosed at age 50, which was treated with surgery.  CANCER HISTORY:  Oncology History  Malignant neoplasm of upper-outer quadrant of right female breast ([place])  12/30/2020 Initial Diagnosis   A right breast mass was palpated on clinical exam. Diagnostic mammogram and US showed calcifications in the upper outer and upper inner right breast, 9.7cm, and multiple masses from the 9-2 o'clock position, 1.8cm, 3.3cm, 1.1cm, 2.8cm, 0.8cm, and 2 abnormal lymph nodes in the right axilla. Biopsy showed invasive mammary carcinoma in the breast and axilla, grade 2. ER 100%, PR 100%, Ki-67 20%, HER-2 negative   01/01/2021 Cancer Staging   Staging form: Breast, AJCC 8th Edition - Clinical stage from 01/01/2021: Stage IIA (cT3, cN1, cM0, G2, ER+, PR+, HER2-) - Signed by Nicholas Lose, MD on 01/01/2021 Stage prefix: Initial  diagnosis Histologic grading system: 3 grade system     RISK FACTORS:  Menarche was at age 40.  First live birth at age 69.  OCP use for approximately 10 years.  Ovaries intact: yes.  Hysterectomy: no.  Menopausal status: premenopausal.  HRT use: 0 years. Colonoscopy: no; not examined. Mammogram within the last year: yes. Number of breast biopsies: 1. Up to date with pelvic exams: yes.  Past Medical History:  Diagnosis Date  . Cancer [facility]) 2008   thyroid   . Family history of thyroid cancer 01/02/2021  . GERD (gastroesophageal reflux disease)    with pregnancy  . Headache(784.0)    occ  . History of chicken pox   . History of thyroid cancer 01/02/2021  . Hypothyroidism     Past Surgical History:  Procedure Laterality Date  . CESAREAN SECTION  2010  . CESAREAN SECTION N/A 05/14/2014   Procedure: CESAREAN SECTION;  Surgeon: Cyril Mourning, MD;  Location: [facility];  Service: Obstetrics;  Laterality: N/A;  . DILATION AND CURETTAGE OF UTERUS     miscarriage  . THYROIDECTOMY      Social History   Socioeconomic History  . Marital status: Married    Spouse name: Not on file  . Number of children: Not on file  . Years of education: Not on file  . Highest education level: Not on file  Occupational History  . Not on file  Tobacco Use  . Smoking status: Never Smoker  . Smokeless tobacco: Never Used  Vaping Use  . Vaping Use: Never used  Substance and Sexual Activity  . Alcohol use: Yes  Comment: occassional  . Drug use: No  . Sexual activity: Yes    Birth control/protection: I.U.D.  Other Topics Concern  . 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 Lack of Transportation (Medical): No  . Lack of Transportation (Non-Medical): No  Physical  Activity: Not on file  Stress: Not on file  Social Connections: Not on file     FAMILY HISTORY:  We obtained a detailed, 4-generation family history.  Significant diagnoses are listed below: Family History  Problem Relation Age of Onset  . Thyroid cancer Mother        anaplastic; dx late 58s  . Non-Hodgkin's lymphoma Maternal Grandmother        d.89  . Breast cancer Neg Hx   . Colon cancer Neg Hx       Ms. Lundquist has one daughter and one son.  She also has a maternal half brother and paternal half sister, both without a cancer history.  Ms. Sherr mother was diagnosed with anaplastic thyroid cancer in her late 43s and is now age 91.  Ms. Hebdon maternal grandmother has a history of NHL.  Ms. Caudell father is living at age 51 without a cancer history.  No other family history of cancer was reported.   Ms. Fear is unaware of previous family history of genetic testing for hereditary cancer risks. Patient's maternal ancestors are of Scottish/South African descent, and paternal ancestors are of Scottish/South African descent. There is no reported Ashkenazi Jewish ancestry. Ms. Torrens reported that her parents are fourth cousins.   GENETIC COUNSELING ASSESSMENT: Ms. Hlavaty is a 40 y.o. female with a personal history of cancer which is somewhat suggestive of a hereditary cancer syndrome and predisposition to cancer given her age of diagnosis. We, therefore, discussed and recommended the following at today's visit.   DISCUSSION: We discussed that 5 - 10% of cancer is hereditary, with most cases of hereditary breast cancer associated with mutations in BRCA1/2.  There are other genes that can be associated with hereditary breast and thyroid cancer syndromes. Type of cancer risk and level of risk are gene-specific. We discussed that testing is beneficial for several reasons including knowing how to follow individuals after completing their treatment, identifying whether potential treatment options would  be beneficial, and understanding if other family members could be at risk for cancer and allowing them to undergo genetic testing.   We reviewed the characteristics, features and inheritance patterns of hereditary cancer syndromes. We also discussed genetic testing, including the appropriate family members to test, the process of testing, insurance coverage and turn-around-time for results. We discussed the implications of a negative, positive and/or variant of uncertain significant result. In order to get genetic test results in a timely manner so that Ms. Foye can use these genetic test results for surgical decisions, we recommended Ms. Becknell pursue genetic testing for the [facility] STAT Breast Panel. Once complete, we recommend Ms. Zipp pursue reflex genetic testing to a more comprehensive gene panel.   The Multi-Cancer Panel offered by [facility] includes sequencing and/or deletion duplication testing of the following 84 genes: AIP, ALK, APC, ATM, AXIN2,BAP1,  BARD1, BLM, BMPR1A, BRCA1, BRCA2, BRIP1, CASR, CDC73, CDH1, CDK4, CDKN1B, CDKN1C, CDKN2A (p14ARF), CDKN2A (p16INK4a), CEBPA, CHEK2, CTNNA1,  DICER1, DIS3L2, EGFR (c.2369C>T, p.Thr790Met variant only), EPCAM (Deletion/duplication testing only), FH, FLCN, GATA2, GPC3, GREM1 (Promoter region deletion/duplication testing only), HOXB13 (c.251G>A, p.Gly84Glu), HRAS, KIT, MAX, MEN1, MET, MITF (c.952G>A, p.Glu318Lys variant only), MLH1, MSH2, MSH3, MSH6, MUTYH, NBN, NF1, NF2, NTHL1, PALB2, PDGFRA, PHOX2B, PMS2, POLD1, POLE, POT1, PRKAR1A, PTCH1, PTEN, RAD50, RAD51C, RAD51D, RB1, RECQL4, RET, RUNX1, SDHAF2, SDHA (sequence changes only), SDHB, SDHC, SDHD, SMAD4, SMARCA4, SMARCB1, SMARCE1, STK11, SUFU, TERC, TERT, TMEM127, TP53, TSC1, TSC2, VHL, WRN and WT1.   Based on Ms. Santone's personal history of breast cancer before the age of 21, she meets medical criteria for genetic testing. Despite that she meets criteria, she may still have an out of pocket cost. We  discussed the availability of the Patient Assistance Program through [facility], which waives the cost of genetic testing based on income requirements.  Ms. Zingaro submitted an application for this program.  She is aware that the laboratory may reach out to confirm income.   PLAN: After considering the risks, benefits, and limitations, Ms. Mapel provided informed consent to pursue genetic testing and the blood sample was sent to [facility] for analysis of the STAT + Multi-Cancer panel. Results should be available within approximately 1-2 weeks' time, at which point they will be disclosed by telephone to Ms. Rafalski, as will any additional recommendations warranted by these results. Ms. Ravan will receive a summary of her genetic counseling visit and a copy of her results once available. This information will also be available in Epic.   Lastly, we encouraged Ms. Abdulaziz to remain in contact with cancer genetics annually so that we can continuously update the family history and inform her of any changes in cancer genetics and testing that may be of benefit for this family.   Ms. Galka questions were answered to her satisfaction today. Our contact information was provided should additional questions or concerns arise. Thank you for the referral and allowing [place] to share in the care of your patient.   Zeek Rostron M. Joette Catching, [place], [facility] Genetic Counselor [email or web address] (P) [phone]  The patient was seen for a total of 20 minutes in face-to-face genetic counseling.  Drs. Magrinat, Lindi Adie and/or Burr Medico were available to discuss this case as needed.  _______________________________________________________________________ For Office Staff:  Number of people involved in session: 1 Was an Intern/ student involved with case: no

## 2021-01-05 ENCOUNTER — Telehealth: Payer: Self-pay | Admitting: *Deleted

## 2021-01-05 ENCOUNTER — Encounter: Payer: Self-pay | Admitting: *Deleted

## 2021-01-05 ENCOUNTER — Other Ambulatory Visit: Payer: Self-pay

## 2021-01-05 ENCOUNTER — Encounter: Payer: Self-pay | Admitting: Hematology and Oncology

## 2021-01-05 ENCOUNTER — Ambulatory Visit (HOSPITAL_COMMUNITY)
Admission: RE | Admit: 2021-01-05 | Discharge: 2021-01-05 | Disposition: A | Discharge status: Home / Self Care | Payer: Medicaid Other | Source: Ambulatory Visit | Attending: Hematology and Oncology | Admitting: Hematology and Oncology

## 2021-01-05 DIAGNOSIS — C50411 Malignant neoplasm of upper-outer quadrant of right female breast: Secondary | ICD-10-CM | POA: Diagnosis present

## 2021-01-05 MED ORDER — IOHEXOL 300 MG/ML  SOLN
100.0000 mL | Freq: Once | INTRAMUSCULAR | Status: AC | PRN
  Administered 2021-01-05: 100 mL via INTRAVENOUS

## 2021-01-05 NOTE — Telephone Encounter (Signed)
Spoke to pt concerning BCCP and retroactive Medicaid. Informed pt according to [facility] will be retroactive and any workup will be coved starting from 12/09/20. Gave pt this information Confirmed future appts. No further needs voiced at this time.

## 2021-01-06 ENCOUNTER — Encounter: Payer: Self-pay | Admitting: Hematology and Oncology

## 2021-01-06 ENCOUNTER — Ambulatory Visit (HOSPITAL_COMMUNITY)
Admission: RE | Admit: 2021-01-06 | Discharge: 2021-01-06 | Disposition: A | Discharge status: Home / Self Care | Payer: Medicaid Other | Source: Ambulatory Visit | Attending: Obstetrics and Gynecology | Admitting: Obstetrics and Gynecology

## 2021-01-06 ENCOUNTER — Telehealth: Payer: Self-pay | Admitting: Licensed Clinical Social Worker

## 2021-01-06 ENCOUNTER — Telehealth: Payer: Self-pay | Admitting: Genetic Counselor

## 2021-01-06 DIAGNOSIS — C50919 Malignant neoplasm of unspecified site of unspecified female breast: Secondary | ICD-10-CM

## 2021-01-06 MED ORDER — GADOBUTROL 1 MMOL/ML IV SOLN
9.0000 mL | Freq: Once | INTRAVENOUS | Status: AC | PRN
  Administered 2021-01-06: 9 mL via INTRAVENOUS

## 2021-01-06 NOTE — Telephone Encounter (Signed)
[employer] representative attempted to contact patient by phone  to offer support and assess for needs in patient with newly diagnosed breast cancer. No answer, voicemail full so unable to leave message. Patient may be referred to support services as needed through treatment.      Nye, [place] Worker [employer]

## 2021-01-06 NOTE — Telephone Encounter (Signed)
Called patient to discuss TAT for genetic testing results.  Testing expected to result out between 3/31 and 4/7.  Will contact patient directly when results are available.

## 2021-01-08 ENCOUNTER — Encounter: Payer: Self-pay | Admitting: *Deleted

## 2021-01-08 ENCOUNTER — Other Ambulatory Visit: Payer: Self-pay | Admitting: General Surgery

## 2021-01-08 ENCOUNTER — Telehealth: Payer: Self-pay | Admitting: *Deleted

## 2021-01-08 DIAGNOSIS — C50411 Malignant neoplasm of upper-outer quadrant of right female breast: Secondary | ICD-10-CM

## 2021-01-08 DIAGNOSIS — Z170 Estrogen receptor positive status [ER+]: Secondary | ICD-10-CM

## 2021-01-08 NOTE — Telephone Encounter (Signed)
Spoke to pt concerning next steps after meeting with Dr. Iran Planas to discuss reconstruction. Informed she will receive a call from Dr. Cristal Generous office with sx date and instructions. Received verbal understanding.

## 2021-01-09 ENCOUNTER — Ambulatory Visit: Payer: Self-pay | Admitting: Genetic Counselor

## 2021-01-09 ENCOUNTER — Telehealth: Payer: Self-pay | Admitting: Genetic Counselor

## 2021-01-09 ENCOUNTER — Encounter: Payer: Self-pay | Admitting: Genetic Counselor

## 2021-01-09 DIAGNOSIS — Z1379 Encounter for other screening for genetic and chromosomal anomalies: Secondary | ICD-10-CM

## 2021-01-09 DIAGNOSIS — Z170 Estrogen receptor positive status [ER+]: Secondary | ICD-10-CM

## 2021-01-09 DIAGNOSIS — C50411 Malignant neoplasm of upper-outer quadrant of right female breast: Secondary | ICD-10-CM

## 2021-01-09 DIAGNOSIS — Z808 Family history of malignant neoplasm of other organs or systems: Secondary | ICD-10-CM

## 2021-01-09 DIAGNOSIS — Z85850 Personal history of malignant neoplasm of thyroid: Secondary | ICD-10-CM

## 2021-01-09 NOTE — Progress Notes (Signed)
HPI:  Ms. Barra was previously seen in the [facility] due to a personal history of cancer and concerns regarding a hereditary predisposition to cancer. Please refer to our prior cancer genetics clinic note for more information regarding our discussion, assessment and recommendations, at the time. Ms. Basley recent genetic test results were disclosed to her, as were recommendations warranted by these results. These results and recommendations are discussed in more detail below.  CANCER HISTORY:  Oncology History  Malignant neoplasm of upper-outer quadrant of right female breast ([place])  12/30/2020 Initial Diagnosis   A right breast mass was palpated on clinical exam. Diagnostic mammogram and US showed calcifications in the upper outer and upper inner right breast, 9.7cm, and multiple masses from the 9-2 o'clock position, 1.8cm, 3.3cm, 1.1cm, 2.8cm, 0.8cm, and 2 abnormal lymph nodes in the right axilla. Biopsy showed invasive mammary carcinoma in the breast and axilla, grade 2. ER 100%, PR 100%, Ki-67 20%, HER-2 negative   01/01/2021 Cancer Staging   Staging form: Breast, AJCC 8th Edition - Clinical stage from 01/01/2021: Stage IIA (cT3, cN1, cM0, G2, ER+, PR+, HER2-) - Signed by Nicholas Lose, MD on 01/01/2021 Stage prefix: Initial diagnosis Histologic grading system: 3 grade system   01/08/2021 Genetic Testing   Negative hereditary cancer genetic testing: no pathogenic variants detected in [facility] Multi-Cancer Panel.  The report date is January 08, 2021.    The Multi-Gene Panel offered by [facility] includes sequencing and/or deletion duplication testing of the following 84 genes: AIP, ALK, APC, ATM, AXIN2,BAP1,  BARD1, BLM, BMPR1A, BRCA1, BRCA2, BRIP1, CASR, CDC73, CDH1, CDK4, CDKN1B, CDKN1C, CDKN2A (p14ARF), CDKN2A (p16INK4a), CEBPA, CHEK2, CTNNA1, DICER1, DIS3L2, EGFR (c.2369C>T, p.Thr790Met variant only), EPCAM (Deletion/duplication testing only), FH, FLCN, GATA2, GPC3, GREM1  (Promoter region deletion/duplication testing only), HOXB13 (c.251G>A, p.Gly84Glu), HRAS, KIT, MAX, MEN1, MET, MITF (c.952G>A, p.Glu318Lys variant only), MLH1, MSH2, MSH3, MSH6, MUTYH, NBN, NF1, NF2, NTHL1, PALB2, PDGFRA, PHOX2B, PMS2, POLD1, POLE, POT1, PRKAR1A, PTCH1, PTEN, RAD50, RAD51C, RAD51D, RB1, RECQL4, RET, RUNX1, SDHAF2, SDHA (sequence changes only), SDHB, SDHC, SDHD, SMAD4, SMARCA4, SMARCB1, SMARCE1, STK11, SUFU, TERC, TERT, TMEM127, TP53, TSC1, TSC2, VHL, WRN and WT1.      FAMILY HISTORY:  We obtained a detailed, 4-generation family history.  Significant diagnoses are listed below: Family History  Problem Relation Age of Onset  . Thyroid cancer Mother        anaplastic; dx late 29s  . Non-Hodgkin's lymphoma Maternal Grandmother        d.89    Ms. Albert has one daughter and one son.  She also has a maternal half brother and paternal half sister, both without a cancer history.  Ms. Mcgloin mother was diagnosed with anaplastic thyroid cancer in her late 19s and is now age 1.  Ms. Leinweber maternal grandmother has a history of NHL.  Ms. Mahan father is living at age 21 without a cancer history.  No other family history of cancer was reported.   Ms. Polzin is unaware of previous family history of genetic testing for hereditary cancer risks. Patient's maternal ancestors are of [place] descent, and paternal ancestors are of [place] descent. There is no reported Ashkenazi Jewish ancestry. Ms. Civello reported that her parents are fourth cousins.  GENETIC TEST RESULTS: Genetic testing reported out on January 08, 2021.  The [facility] Multi-Cancer Panel found no pathogenic mutations.  The Multi-Gene Panel offered by [facility] includes sequencing and/or deletion duplication testing of the following 84 genes: AIP, ALK, APC, ATM,  AXIN2,BAP1,  BARD1, BLM, BMPR1A, BRCA1, BRCA2, BRIP1, CASR, CDC73, CDH1, CDK4, CDKN1B, CDKN1C, CDKN2A (p14ARF), CDKN2A (p16INK4a), CEBPA, CHEK2,  CTNNA1, DICER1, DIS3L2, EGFR (c.2369C>T, p.Thr790Met variant only), EPCAM (Deletion/duplication testing only), FH, FLCN, GATA2, GPC3, GREM1 (Promoter region deletion/duplication testing only), HOXB13 (c.251G>A, p.Gly84Glu), HRAS, KIT, MAX, MEN1, MET, MITF (c.952G>A, p.Glu318Lys variant only), MLH1, MSH2, MSH3, MSH6, MUTYH, NBN, NF1, NF2, NTHL1, PALB2, PDGFRA, PHOX2B, PMS2, POLD1, POLE, POT1, PRKAR1A, PTCH1, PTEN, RAD50, RAD51C, RAD51D, RB1, RECQL4, RET, RUNX1, SDHAF2, SDHA (sequence changes only), SDHB, SDHC, SDHD, SMAD4, SMARCA4, SMARCB1, SMARCE1, STK11, SUFU, TERC, TERT, TMEM127, TP53, TSC1, TSC2, VHL, WRN and WT1.   The test report has been scanned into EPIC and is located under the Molecular Pathology section of the Results Review tab.  A portion of the result report is included below for reference.     We discussed with Ms. Surles that because current genetic testing is not perfect, it is possible there may be a gene mutation in one of these genes that current testing cannot detect, but that chance is small.  We also discussed, that there could be another gene that has not yet been discovered, or that we have not yet tested, that is responsible for the cancer diagnoses in the family. It is also possible there is a hereditary cause for the cancer in the family that Ms. Grimme did not inherit and therefore was not identified in her testing.  Therefore, it is important to remain in touch with cancer genetics in the future so that we can continue to offer Ms. Nucci the most up to date genetic testing.    ADDITIONAL GENETIC TESTING: We discussed with Ms. Leuthold that her genetic testing was fairly extensive.  If there are genes identified to increase cancer risk that can be analyzed in the future, we would be happy to discuss and coordinate this testing at that time.    CANCER SCREENING RECOMMENDATIONS: Ms. Gainey test result is considered negative (normal).  This means that we have not identified a  hereditary cause for her personal history of cancer at this time. Most cancers happen by chance and this negative test suggests that her cancer may fall into this category.    While reassuring, this does not definitively rule out a hereditary predisposition to cancer. It is still possible that there could be genetic mutations that are undetectable by current technology. There could be genetic mutations in genes that have not been tested or identified to increase cancer risk.  Therefore, it is recommended she continue to follow the cancer management and screening guidelines provided by her oncology and primary healthcare provider.   An individual's cancer risk and medical management are not determined by genetic test results alone. Overall cancer risk assessment incorporates additional factors, including personal medical history, family history, and any available genetic information that may result in a personalized plan for cancer prevention and surveillance  RECOMMENDATIONS FOR FAMILY MEMBERS:  Individuals in this family might be at some increased risk of developing cancer, over the general population risk, simply due to the family history of cancer.  We recommended women in this family have a yearly mammogram beginning at age 108, or 44 years younger than the earliest onset of cancer, an annual clinical breast exam, and perform monthly breast self-exams. Women in this family should also have a gynecological exam as recommended by their primary provider. All family members should be referred for colonoscopy starting at age 53.  FOLLOW-UP: Lastly, we discussed with Ms. Prest that  cancer genetics is a rapidly advancing field and it is possible that new genetic tests will be appropriate for her and/or her family members in the future. We encouraged her to remain in contact with cancer genetics on an annual basis so we can update her personal and family histories and let her know of advances in cancer genetics  that may benefit this family.   Our contact number was provided. Ms. Henderson questions were answered to her satisfaction, and she knows she is welcome to call us at anytime with additional questions or concerns.   Lenay Lovejoy M. Joette Catching, [place], [facility] Genetic Counselor [email or web address] (P) [phone]

## 2021-01-09 NOTE — Telephone Encounter (Addendum)
Revealed negative genetic testing.  Discussed that we do not know why she has breast cancer or why there is cancer in the family. It could be sporadic, due to a different gene that we are not testing, or maybe our current technology may not be able to pick something up.  It will be important for her to keep in contact with genetics to keep up with whether additional testing may be needed.

## 2021-01-12 ENCOUNTER — Encounter: Payer: Self-pay | Admitting: *Deleted

## 2021-01-12 ENCOUNTER — Other Ambulatory Visit: Payer: Self-pay | Admitting: General Surgery

## 2021-01-12 DIAGNOSIS — C50411 Malignant neoplasm of upper-outer quadrant of right female breast: Secondary | ICD-10-CM

## 2021-01-12 DIAGNOSIS — Z170 Estrogen receptor positive status [ER+]: Secondary | ICD-10-CM

## 2021-01-14 ENCOUNTER — Other Ambulatory Visit: Payer: Self-pay

## 2021-01-14 ENCOUNTER — Encounter (HOSPITAL_COMMUNITY)
Admission: RE | Admit: 2021-01-14 | Discharge: 2021-01-14 | Disposition: A | Discharge status: Home / Self Care | Payer: Medicaid Other | Source: Ambulatory Visit | Attending: Hematology and Oncology | Admitting: Hematology and Oncology

## 2021-01-14 ENCOUNTER — Ambulatory Visit (HOSPITAL_COMMUNITY)
Admission: RE | Admit: 2021-01-14 | Discharge: 2021-01-14 | Disposition: A | Discharge status: Home / Self Care | Payer: Medicaid Other | Source: Ambulatory Visit | Attending: Hematology and Oncology | Admitting: Hematology and Oncology

## 2021-01-14 DIAGNOSIS — C50411 Malignant neoplasm of upper-outer quadrant of right female breast: Secondary | ICD-10-CM | POA: Diagnosis present

## 2021-01-14 MED ORDER — TECHNETIUM TC 99M MEDRONATE IV KIT
22.0000 | PACK | Freq: Once | INTRAVENOUS | Status: AC
  Administered 2021-01-14: 22 via INTRAVENOUS

## 2021-01-15 ENCOUNTER — Encounter: Payer: Self-pay | Admitting: *Deleted

## 2021-01-16 NOTE — H&P (Signed)
Subjective:     Patient ID: Phyllis Harris is a 40 y.o. female.  HPI  Here for follow up discussion breast reconstruction prior to planned right mastectomy. Presented with palpable right breast mass.Diagnostic MMG/US showed calcifications in the upper outer and upper inner right breast, 9.7cm, and multiple masses from the 9-2 o'clock position, 1.8cm, 3.3cm, 1.1cm, 2.8cm, 0.8cm, and 2 abnormal right axillary LN.Biopsies labed right breast 11 o clock, right breast 2 o clock, and LN all showedinvasive mammary carcinoma with carcinoma in situ ER/PR+, Her 2-.   MRI demonstrated NME involving the upper inner and upper outer quadrants of the right breast consistent with the patient's known malignancy spanning 7.4 x 7.2 x 5.9 cm. On mammography, the suspicious calcifications spanned up to 9.7 cm. A 1.4 cm mass correlating with the patient's palpable lump involving the lateral aspect of the right nipple areolar complex with skin involvement in the areola. Two abnormal LN axillary noted, one with biopsy clip.  Mastectomy recommended given span of disease.  CT CAP negative for distant disease. Ovarian cyst noted and pelvis US recommended. Bone scan pending.   Plan Mammaprint on surgical specimen pending nodal involvement. She will receive adjuvant radiation.  Genetics negative. Mother and patient with history thyroid ca. Patient reports mother's carotid a resected with surgery, underwent latissimus dorsi reconstruction to breast.  Current 36 B, desires C cup. Wt up 20 lb over last year.  Works as Garment/textile technologist part time. Lives with spouse and two kids, one of which has special needs. Husband has own business.  Review of Systems  Allergic/Immunologic: Positive for environmental allergies.  Hematological: Bruises/bleeds easily.   Remainder 12 point review negative    Objective:   Physical Exam Cardiovascular:     Rate and Rhythm: Normal rate and regular rhythm.     Heart  sounds: Normal heart sounds.  Pulmonary:     Effort: Pulmonary effort is normal.     Breath sounds: Normal breath sounds.  Abdominal:     Comments: Small to moderate volume for reconstruction  Skin:    Comments: Fitzpatrick 2     Breasts: No ptosis bilateral, right breast ecchymoses resolving SN to nipple R 26 L 27 cm BW R 19 L 19 cm CW 14 cm Nipple to IMF R 7 L 7 cm     Assessment:     Right breast ca UOQ ER+    Plan:     Plan right mastectomy tissue expander reconstruction acellular dermis reconstruction.   Reviewedradiation significantly increases risk reconstruction including wound healing problems,capsular contracture. Options would be to delay reconstruction and pursue LD + TE orautologous in future. Alternative would be placement expander at time of mastectomy- the benefit of this would beto "have something there.".However this would then mean expander will be in place for several months (approximately 6 months from end of radiation) before continuing reconstruction process. At that time could do implant exchange alone, implant exchange with LD flap for radiated chest, or coversion to autologous.Patient has elected for immediate expander placement.  Revieweddrains, OR length, hospital stay and recovery, limitations. Discussed process of expansion and implant based risks including rupture,imagingsurveillance for silicone implants, infection requiring surgery or removal, contracture.   Discussed use of acellular dermis in reconstruction, cadaveric source, incorporation over several weeks, risk that if has seroma or infection can act as additional nidus for infection if not incorporated.  Discussed prepectoral vs sub pectoral reconstruction. Discussed with patient and benefit of this is no animation deformity, may  be less pain. Risk may be more visible rippling over upper poles, greater need of ADM. Reviewed pre pectoral would require larger amount acellular dermis,  more drains. Discussed any type reconstruction also risks long term displacement implant and visible rippling. If prepectoral counseled I would recommend she be comfortable with silicone implants as more options that have less rippling. She agrees to prepectoral placement.  Reviewed reconstruction will be asensate and not stimulate. Reviewed additional risks including but not limited to risks mastectomy flap necrosis requiring additional surgery, seroma, hematoma, asymmetry, need to additional procedures, fat necrosis, DVT/PE, damage to adjacent structures, cardiopulmonary complications.  Discussed risk COVID infectionthrough this elective surgery. Patient will receive COVID testing prior to surgery. Discussed even if patient receivesa negative test result, the tests in some cases may fail to detect the virus or patient maycontract COVID after the test.COVID 19 infectionbefore/during/aftersurgery may result in lead to a higher chance of complication and death.  Drain teaching completed. Rx for [facility] [place] given.Rx foroxydodone Robaxin and Bactrim given.

## 2021-01-19 ENCOUNTER — Ambulatory Visit: Payer: Medicaid Other | Attending: General Surgery

## 2021-01-19 ENCOUNTER — Other Ambulatory Visit: Payer: Self-pay

## 2021-01-19 ENCOUNTER — Encounter: Payer: Self-pay | Admitting: *Deleted

## 2021-01-19 DIAGNOSIS — R293 Abnormal posture: Secondary | ICD-10-CM | POA: Diagnosis present

## 2021-01-19 DIAGNOSIS — Z170 Estrogen receptor positive status [ER+]: Secondary | ICD-10-CM | POA: Insufficient documentation

## 2021-01-19 DIAGNOSIS — C50411 Malignant neoplasm of upper-outer quadrant of right female breast: Secondary | ICD-10-CM | POA: Diagnosis present

## 2021-01-19 NOTE — Therapy (Signed)
[place], [place], [address] Phone: [phone]   Fax:  [phone]  Physical Therapy Evaluation  Patient Details  Name: Phyllis Harris MRN: [id number] Date of Birth: 07-05-1981 Referring Provider (PT): Dr. Donne Hazel   Encounter Date: 01/19/2021   PT End of Session - 01/19/21 2008    Visit Number 1    Number of Visits 2    Date for PT Re-Evaluation 03/06/21    PT Start Time 1505    PT Stop Time 1550    PT Time Calculation (min) 45 min    Activity Tolerance Patient tolerated treatment well    Behavior During Therapy [facility] for tasks assessed/performed           Past Medical History:  Diagnosis Date  . Cancer [facility]) 2008   thyroid   . Family history of thyroid cancer 01/02/2021  . GERD (gastroesophageal reflux disease)    with pregnancy  . Headache(784.0)    occ  . History of chicken pox   . History of thyroid cancer 01/02/2021  . Hypothyroidism     Past Surgical History:  Procedure Laterality Date  . CESAREAN SECTION  2010  . CESAREAN SECTION N/A 05/14/2014   Procedure: CESAREAN SECTION;  Surgeon: Cyril Mourning, MD;  Location: [facility];  Service: Obstetrics;  Laterality: N/A;  . DILATION AND CURETTAGE OF UTERUS     miscarriage  . THYROIDECTOMY      There were no vitals filed for this visit.    Subjective Assessment - 01/19/21 1510    Subjective Here for pre-surgical screen    Pertinent History Pt had mammogram and [place] on December 25, 2020 followed by a biopsy on March 18,2022 diagnosing her Cancer.  Scheduled for a right mastectomy with tissue expander on 01/23/2021 with adjuvant radiation.    How long can you sit comfortably? no limit    How long can you walk comfortably? no limit    Diagnostic tests diagnostic mammogram/US    Patient Stated Goals Pre- surgical screen    Currently in Pain? No/denies              [facility] PT Assessment - 01/19/21 0001      Assessment   Medical  Diagnosis Right breast Cancer    Referring Provider (PT) Dr. Donne Hazel    Onset Date/Surgical Date 01/23/21    Hand Dominance Right    Prior Therapy no      Precautions   Precaution Comments will be lymphedema risk      Restrictions   Weight Bearing Restrictions No    Other Position/Activity Restrictions no      Balance Screen   Has the patient fallen in the past 6 months No    Has the patient had a decrease in activity level because of a fear of falling?  No    Is the patient reluctant to leave their home because of a fear of falling?  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[employer], walking,      Cognition   Overall Cognitive Status Within Functional Limits for tasks assessed  Sensation   Light Touch Appears Intact      Coordination   Gross Motor Movements are Fluid and Coordinated Yes      Posture/Postural Control   Posture/Postural Control Postural limitations    Postural Limitations Rounded Shoulders;Forward head      AROM   Right Shoulder Extension 64 Degrees    Right Shoulder Flexion 174 Degrees    Right Shoulder ABduction 180 Degrees    Right Shoulder Internal Rotation 85 Degrees    Right Shoulder External Rotation 120 Degrees    Left Shoulder Extension 58 Degrees    Left Shoulder Flexion 176 Degrees    Left Shoulder ABduction 180 Degrees    Left Shoulder Internal Rotation 68 Degrees    Left Shoulder External Rotation 105 Degrees             LYMPHEDEMA/ONCOLOGY QUESTIONNAIRE - 01/19/21 0001      Surgeries   Mastectomy Date 01/23/21    Sentinel Lymph Node Biopsy Date 01/23/21      Treatment   Active Chemotherapy Treatment No    Past  Chemotherapy Treatment No    Active Radiation Treatment No    Past Radiation Treatment No    Current Hormone Treatment No    Past Hormone Therapy Yes      What other symptoms do you have   Are you Having Heaviness or Tightness No    Are you having Pain No    Are you having pitting edema No    Is it Hard or Difficult finding clothes that fit No    Do you have infections No    Is there Decreased scar mobility No      Right Upper Extremity Lymphedema   15 cm Proximal to Olecranon Process 35.8 cm    10 cm Proximal to Olecranon Process 35 cm    Olecranon Process 30.1 cm    15 cm Proximal to Ulnar Styloid Process 29.2 cm    10 cm Proximal to Ulnar Styloid Process 25.9 cm    Just Proximal to Ulnar Styloid Process 19.2 cm    Across Hand at [employer] 21.3 cm    At [facility] of 2nd Digit 7 cm      Left Upper Extremity Lymphedema   15 cm Proximal to Olecranon Process 36.3 cm    10 cm Proximal to Olecranon Process 35.2 cm    Olecranon Process 30.4 cm    15 cm Proximal to Ulnar Styloid Process 28.8 cm    10 cm Proximal to Ulnar Styloid Process 25.8 cm    Just Proximal to Ulnar Styloid Process 18.9 cm    Across Hand at [employer] 20.2 cm    At [place] of 2nd Digit 6.6 cm           L-DEX FLOWSHEETS - 01/19/21 1500      L-DEX LYMPHEDEMA SCREENING   Measurement Type Unilateral    L-DEX MEASUREMENT EXTREMITY Upper Extremity    POSITION  Standing    DOMINANT SIDE Right    At Risk Side Right    BASELINE SCORE (UNILATERAL) -4.2                Quick Dash - 01/19/21 0001    Open a tight or new jar No difficulty    Do heavy household chores (wash walls, wash floors) No difficulty    Carry a shopping bag or briefcase No difficulty    Wash your back No difficulty    Use  a knife to cut food No difficulty    Recreational activities in which you take some force or impact through your arm, shoulder, or hand (golf, hammering, tennis) No difficulty    During the past week, to what  extent has your arm, shoulder or hand problem interfered with your normal social activities with family, friends, neighbors, or groups? Not at all    During the past week, to what extent has your arm, shoulder or hand problem limited your work or other regular daily activities Not at all    Arm, shoulder, or hand pain. None    Tingling (pins and needles) in your arm, shoulder, or hand None    Difficulty Sleeping No difficulty    DASH Score 0 %            Objective measurements completed on examination: See above findings.               PT Education - 01/19/21 2006    Education Details Pt was educated in 4 post op exercises to do when allowed by MD and after drains are removed.  We also discussed skin care and ways to prevent lymphedema.  Discussed ABC class and also the benefits of doing SOZO every 3 months for 2 years.    Person(s) Educated Patient    Methods Explanation;Demonstration;Handout    Comprehension Verbalized understanding;Returned demonstration               PT Long Term Goals - 01/19/21 2016      PT LONG TERM GOAL #1   Title Pt will have right shoulder ROM and function nearly equal to left    Time 6    Period Weeks    Status New    Target Date 03/02/21           Breast Clinic Goals - 01/19/21 2015      Patient will be able to verbalize understanding of pertinent lymphedema risk reduction practices relevant to her diagnosis specifically related to skin care.   Time 1    Period Days    Status Achieved    Target Date 01/19/21      Patient will be able to return demonstrate and/or verbalize understanding of the post-op home exercise program related to regaining shoulder range of motion.   Time 1    Period Days    Status Achieved      Patient will be able to verbalize understanding of the importance of attending the postoperative After Breast Cancer Class for further lymphedema risk reduction education and therapeutic exercise.   Time 1     Period Days    Status Achieved    Target Date 01/19/21                 Plan - 01/19/21 2009    Clinical Impression Statement pt was seen for Pre-op screening.  bilateral shoulder ROM was assessed and bilateral UE circumferential measuring was performed.  She was educated in the importance of the ABC class as well as SOZO every 3 months.  Baseline SOZO was performed today.  She was also educated in 4 post op exercises to do as allowed by MD and when drains are removed    Personal Factors and Comorbidities Comorbidity 2    Comorbidities thyroid cancer, breast Cancer    Stability/Clinical Decision Making Stable/Uncomplicated    Clinical Decision Making Low    Rehab Potential Excellent    PT Frequency 1x / week  PT Duration 6 weeks    PT Treatment/Interventions ADLs/Self Care Home Management;Therapeutic exercise;Patient/family education    PT Next Visit Plan Reassess after surgery and determine need for further therapy, set up ABC class.  SOZO already set up    PT Home Exercise Plan 4 post op exercises as allowed by MD    Recommended Other Services sleeve    Consulted and Agree with Plan of Care Patient           Patient will benefit from skilled therapeutic intervention in order to improve the following deficits and impairments:  Decreased knowledge of precautions,Postural dysfunction  Visit Diagnosis: Abnormal posture  Malignant neoplasm of upper-outer quadrant of right breast in female, estrogen receptor positive ([place])     Problem List Patient Active Problem List   Diagnosis Date Noted  . Genetic testing 01/09/2021  . Family history of thyroid cancer 01/02/2021  . History of thyroid cancer 01/02/2021  . Malignant neoplasm of upper-outer quadrant of right female breast ([place]) 12/30/2020  . S/P cesarean section 05/14/2014  Patient will follow up at outpatient cancer rehab 3-4 weeks following surgery.  If the patient requires physical therapy at that time, a specific  plan will be dictated and sent to the referring physician for approval. The patient was educated today on appropriate basic range of motion exercises to begin post operatively and the importance of attending the After Breast Cancer class following surgery.  Patient was educated today on lymphedema risk reduction practices as it pertains to recommendations that will benefit the patient immediately following surgery.  She verbalized good understanding.   The patient was assessed using the L-Dex machine today to produce a lymphedema index baseline score. The patient will be reassessed on a regular basis (typically every 3 months) to obtain new L-Dex scores. If the score is > 6.5 points away from his/her baseline score indicating onset of subclinical lymphedema, it will be recommended to wear a compression garment for 4 weeks, 12 hours per day and then be reassessed. If the score continues to be > 6.5 points from baseline at reassessment, we will initiate lymphedema treatment. Assessing in this manner has a 95% rate of preventing clinically significant lymphedema.   Claris Pong 01/19/2021, 8:21 PM  [place], [place], [address] Phone: [phone]   Fax:  [phone]  Name: KARSEN FELLOWS MRN: [id number] Date of Birth: 1980-11-29 Cheral Almas, PT 01/19/21 8:22 PM

## 2021-01-19 NOTE — Patient Instructions (Signed)
Physical Therapy Information for After Breast Cancer Surgery/Treatment:   Lymphedema is a swelling condition that you may be at risk for in your arm if you have lymph nodes removed from the armpit area.  After a sentinel node biopsy, the risk is approximately 5-9% and is higher after an axillary node dissection.  There is treatment available for this condition and it is not life-threatening.  Contact your physician or physical therapist with concerns.  You may begin the 4 shoulder/posture exercises (see additional sheet) when permitted by your physician (typically a week after surgery).  If you have drains, you may need to wait until those are removed before beginning range of motion exercises.  A general recommendation is to not lift your arms above shoulder height until drains are removed.  These exercises should be done to your tolerance and gently.  This is not a "no pain/no gain" type of recovery so listen to your body and stretch into the range of motion that you can tolerate, stopping if you have pain.  If you are having immediate reconstruction, ask your plastic surgeon about doing exercises as he or she may want you to wait.  We encourage you to attend the free one time ABC (After Breast Cancer) class offered by  [facility].  You will learn information related to lymphedema risk, prevention and treatment and additional exercises to regain mobility following surgery.  You can call [phone] for more information.  This is offered the 1st and 3rd Monday of each month.  You only attend the class one time.  While undergoing any medical procedure or treatment, try to avoid blood pressure being taken or needle sticks from occurring on the arm on the side of cancer.   This recommendation begins after surgery and continues for the rest of your life.  This may help reduce your risk of getting lymphedema (swelling in your arm).  An excellent resource for those seeking information  on lymphedema is the National Lymphedema Network's web site. It can be accessed at www.lymphnet.org  If you notice swelling in your hand, arm or breast at any time following surgery (even if it is many years from now), please contact your doctor or physical therapist to discuss this.  Lymphedema can be treated at any time but it is easier for you if it is treated early on.  If you feel like your shoulder motion is not returning to normal in a reasonable amount of time, please contact your surgeon or physical therapist.  Marti C. Smith, PT, CLT [phone]; [address] [place], [place] [address] ABC CLASS After Breast Cancer Class  After Breast Cancer Class is a specially designed exercise class to assist you in a safe recover after having breast cancer surgery.  In this class you will learn how to get back to full function whether your drains were just removed or if you had surgery a month ago.  This one-time class is held the 1st and 3rd Monday of every month from 11:00 a.m. until 12:00 noon at the [facility] located at [address] [place], [place] [address]  This class is FREE and space is limited. For more information or to register for the next available class, call [phone].  Class Goals   Understand specific stretches to improve the flexibility of you chest and shoulder.  Learn ways to safely strengthen your upper body and improve your posture.  Understand the warning signs of infection and why   you may be at risk for an arm infection.  Learn about Lymphedema and prevention.  ** You do not attend this class until after surgery.  Drains must be removed to participate  Patient was instructed today in a home exercise program today for post op shoulder range of motion. These included active assist shoulder flexion in sitting/supine, scapular retraction, wall walking with shoulder abduction, and hands behind head external rotation in supine or  sitting.  She was encouraged to do these twice a day, holding 3 seconds and repeating 5 times when permitted by her physician and after drains are removed

## 2021-01-20 ENCOUNTER — Other Ambulatory Visit (HOSPITAL_COMMUNITY): Payer: No Typology Code available for payment source

## 2021-01-20 NOTE — Progress Notes (Signed)
Your procedure is scheduled on Friday January 23, 2021.  Report to [facility] Main Entrance "A" at 05:45 A.M., and check in at the Admitting office.  Call this number if you have problems the morning of surgery: [phone]  Call [phone] if you have any questions prior to your surgery date Monday-Friday 8am-4pm   Remember: Do not eat after midnight the night before your surgery  You may drink clear liquids until 04:45AM the morning of your surgery.   Clear liquids allowed are: Water, Non-Citrus Juices (without pulp), Carbonated Beverages, Clear Tea, Black Coffee Only, and Gatorade  Please complete your PRE-SURGERY ENSURE that was provided to you by 04:45AM the morning of your surgery.    Please, if able, drink it in one setting. DO NOT SIP.    Take these medicines the morning of surgery with A SIP OF WATER: levothyroxine (SYNTHROID, LEVOTHROID) sertraline (ZOLOFT)   As of today, STOP taking any Aspirin (unless otherwise instructed by your surgeon), Aleve, Naproxen, Ibuprofen, Motrin, Advil, Goody's, BC's, all herbal medications, fish oil, and all vitamins.    The Morning of Surgery  Do not wear jewelry, make-up or nail polish.  Do not wear lotions, powders, or perfumes, or deodorant  Do not shave 48 hours prior to surgery.    Do not bring valuables to the hospital.  [facility] is not responsible for any belongings or valuables.  If you are a smoker, DO NOT Smoke 24 hours prior to surgery  If you wear a CPAP at night please bring your mask the morning of surgery   Remember that you must have someone to transport you home after your surgery, and remain with you for 24 hours if you are discharged the same day.   Please bring cases for contacts, glasses, hearing aids, dentures or bridgework because it cannot be worn into surgery.    Leave your suitcase in the car.  After surgery it may be brought to your room.  For patients admitted to the hospital, discharge time will  be determined by your treatment team.  Patients discharged the day of surgery will not be allowed to drive home.    Special instructions:   [facility]- Preparing For Surgery  Before surgery, you can play an important role. Because skin is not sterile, your skin needs to be as free of germs as possible. You can reduce the number of germs on your skin by washing with CHG (chlorahexidine gluconate) Soap before surgery.  CHG is an antiseptic cleaner which kills germs and bonds with the skin to continue killing germs even after washing.    Oral Hygiene is also important to reduce your risk of infection.  Remember - BRUSH YOUR TEETH THE MORNING OF SURGERY WITH YOUR REGULAR TOOTHPASTE  Please do not use if you have an allergy to CHG or antibacterial soaps. If your skin becomes reddened/irritated stop using the CHG.  Do not shave (including legs and underarms) for at least 48 hours prior to first CHG shower. It is OK to shave your face.  Please follow these instructions carefully.   1. Shower the NIGHT BEFORE SURGERY and the MORNING OF SURGERY with CHG Soap.   2. If you chose to wash your hair and body, wash as usual with your normal shampoo and body-wash/soap.  3. Rinse your hair and body thoroughly to remove the shampoo and soap.  4. Apply CHG directly to the skin (ONLY FROM THE NECK DOWN) and wash gently with a scrungie or  a clean washcloth.   5. Do not use on open wounds or open sores. Avoid contact with your eyes, ears, mouth and genitals (private parts). Wash Face and genitals (private parts)  with your normal soap.   6. Wash thoroughly, paying special attention to the area where your surgery will be performed.  7. Thoroughly rinse your body with warm water from the neck down.  8. DO NOT shower/wash with your normal soap after using and rinsing off the CHG Soap.  9. Pat yourself dry with a CLEAN TOWEL.  10. Wear CLEAN PAJAMAS to bed the night before surgery  11. Place CLEAN SHEETS  on your bed the night of your first shower and DO NOT SLEEP WITH PETS.  12. Wear comfortable clothes the morning of surgery.     Day of Surgery:  Please shower the morning of surgery with the CHG soap Do not apply any deodorants/lotions. Please wear clean clothes to the hospital/surgery center.   Remember to brush your teeth WITH YOUR REGULAR TOOTHPASTE.   Please read over the following fact sheets that you were given.

## 2021-01-21 ENCOUNTER — Encounter (HOSPITAL_COMMUNITY)
Admission: RE | Admit: 2021-01-21 | Discharge: 2021-01-21 | Disposition: A | Discharge status: Home / Self Care | Payer: Medicaid Other | Source: Ambulatory Visit | Attending: General Surgery | Admitting: General Surgery

## 2021-01-21 ENCOUNTER — Other Ambulatory Visit: Payer: Self-pay

## 2021-01-21 ENCOUNTER — Other Ambulatory Visit (HOSPITAL_COMMUNITY): Payer: No Typology Code available for payment source

## 2021-01-21 ENCOUNTER — Encounter (HOSPITAL_COMMUNITY): Payer: Self-pay

## 2021-01-21 DIAGNOSIS — Z20822 Contact with and (suspected) exposure to covid-19: Secondary | ICD-10-CM | POA: Insufficient documentation

## 2021-01-21 DIAGNOSIS — Z01812 Encounter for preprocedural laboratory examination: Secondary | ICD-10-CM | POA: Insufficient documentation

## 2021-01-21 HISTORY — DX: Anxiety disorder, unspecified: F41.9

## 2021-01-21 HISTORY — DX: Other specified postprocedural states: R11.2

## 2021-01-21 HISTORY — DX: Other specified postprocedural states: Z98.890

## 2021-01-21 LAB — BASIC METABOLIC PANEL
Anion gap: 8 (ref 5–15)
BUN: 9 mg/dL (ref 6–20)
CO2: 26 mmol/L (ref 22–32)
Calcium: 8.2 mg/dL — ABNORMAL LOW (ref 8.9–10.3)
Chloride: 107 mmol/L (ref 98–111)
Creatinine, Ser: 0.75 mg/dL (ref 0.44–1.00)
GFR, Estimated: >60 mL/min (ref >60)
Glucose, Bld: 112 mg/dL — ABNORMAL HIGH (ref 70–99)
Potassium: 4.3 mmol/L (ref 3.5–5.1)
Sodium: 141 mmol/L (ref 135–145)

## 2021-01-21 LAB — CBC
HCT: 41.3 % (ref 36.0–46.0)
Hemoglobin: 13.6 g/dL (ref 12.0–15.0)
MCH: 30.3 pg (ref 26.0–34.0)
MCHC: 32.9 g/dL (ref 30.0–36.0)
MCV: 92 fL (ref 80.0–100.0)
Platelets: 286 10*3/uL (ref 150–400)
RBC: 4.49 MIL/uL (ref 3.87–5.11)
RDW: 12.7 % (ref 11.5–15.5)
WBC: 6.3 10*3/uL (ref 4.0–10.5)
nRBC: 0 % (ref 0.0–0.2)

## 2021-01-21 LAB — POCT PREGNANCY, URINE: Preg Test, Ur: NEGATIVE

## 2021-01-21 LAB — SARS CORONAVIRUS 2 (TAT 6-24 HRS): SARS Coronavirus 2: NEGATIVE

## 2021-01-21 NOTE — Progress Notes (Signed)
PCP - Inda Coke, PA Endocrinologist - Dr Jacelyn Pi Cardiologist - n/a Oncology - Dr Lindi Adie  CT/Chest x-ray - 01/06/21 EKG - n/a Stress Test - n/a ECHO - n/a Cardiac Cath - n/a  ERAS: Clear liquids til 4:45 am DOS  STOP now taking any Aspirin (unless otherwise instructed by your surgeon), Aleve, Naproxen, Ibuprofen, Motrin, Advil, Goody's, BC's, all herbal medications, fish oil, and all vitamins.   Coronavirus Screening Covid test is scheduled on 01/21/21. Do you have any of the following symptoms:  Cough yes/no: No Fever (>100.85F)  yes/no: No Runny nose yes/no: No Sore throat yes/no: No Difficulty breathing/shortness of breath  yes/no: No  Have you traveled in the last 14 days and where? yes/no: No  Patient verbalized understanding of instructions that were given to them at the PAT appointment.

## 2021-01-22 ENCOUNTER — Ambulatory Visit
Admission: RE | Admit: 2021-01-22 | Discharge: 2021-01-22 | Disposition: A | Discharge status: Home / Self Care | Payer: Medicaid Other | Source: Ambulatory Visit | Attending: General Surgery | Admitting: General Surgery

## 2021-01-22 ENCOUNTER — Other Ambulatory Visit: Payer: Self-pay | Admitting: General Surgery

## 2021-01-22 DIAGNOSIS — C50411 Malignant neoplasm of upper-outer quadrant of right female breast: Secondary | ICD-10-CM

## 2021-01-22 DIAGNOSIS — Z170 Estrogen receptor positive status [ER+]: Secondary | ICD-10-CM

## 2021-01-23 ENCOUNTER — Encounter (HOSPITAL_COMMUNITY): Payer: Self-pay | Admitting: General Surgery

## 2021-01-23 ENCOUNTER — Encounter (HOSPITAL_COMMUNITY)
Admission: RE | Disposition: A | Discharge status: Home / Self Care | Payer: Self-pay | Source: Home / Self Care | Attending: General Surgery

## 2021-01-23 ENCOUNTER — Ambulatory Visit
Admission: RE | Admit: 2021-01-23 | Discharge: 2021-01-23 | Disposition: A | Discharge status: Home / Self Care | Payer: No Typology Code available for payment source | Source: Ambulatory Visit | Attending: General Surgery | Admitting: General Surgery

## 2021-01-23 ENCOUNTER — Ambulatory Visit (HOSPITAL_COMMUNITY): Payer: Medicaid Other | Admitting: Certified Registered"

## 2021-01-23 ENCOUNTER — Encounter (HOSPITAL_COMMUNITY)
Admission: RE | Admit: 2021-01-23 | Discharge: 2021-01-23 | Disposition: A | Discharge status: Home / Self Care | Payer: Medicaid Other | Source: Ambulatory Visit | Attending: General Surgery | Admitting: General Surgery

## 2021-01-23 ENCOUNTER — Other Ambulatory Visit: Payer: Self-pay

## 2021-01-23 ENCOUNTER — Observation Stay (HOSPITAL_COMMUNITY)
Admission: RE | Admit: 2021-01-23 | Discharge: 2021-01-24 | Disposition: A | Discharge status: Home / Self Care | Payer: Medicaid Other | Attending: General Surgery | Admitting: General Surgery

## 2021-01-23 DIAGNOSIS — Z170 Estrogen receptor positive status [ER+]: Secondary | ICD-10-CM

## 2021-01-23 DIAGNOSIS — C50919 Malignant neoplasm of unspecified site of unspecified female breast: Secondary | ICD-10-CM

## 2021-01-23 DIAGNOSIS — Z9011 Acquired absence of right breast and nipple: Secondary | ICD-10-CM

## 2021-01-23 DIAGNOSIS — N6331 Unspecified lump in axillary tail of the right breast: Secondary | ICD-10-CM | POA: Diagnosis present

## 2021-01-23 DIAGNOSIS — C773 Secondary and unspecified malignant neoplasm of axilla and upper limb lymph nodes: Secondary | ICD-10-CM | POA: Diagnosis not present

## 2021-01-23 DIAGNOSIS — D0511 Intraductal carcinoma in situ of right breast: Secondary | ICD-10-CM | POA: Diagnosis not present

## 2021-01-23 DIAGNOSIS — C50411 Malignant neoplasm of upper-outer quadrant of right female breast: Secondary | ICD-10-CM

## 2021-01-23 HISTORY — PX: RADIOACTIVE SEED GUIDED AXILLARY SENTINEL LYMPH NODE: SHX6735

## 2021-01-23 HISTORY — PX: BREAST RECONSTRUCTION WITH PLACEMENT OF TISSUE EXPANDER AND ALLODERM: SHX6805

## 2021-01-23 HISTORY — DX: Malignant neoplasm of unspecified site of unspecified female breast: C50.919

## 2021-01-23 HISTORY — PX: MASTECTOMY W/ SENTINEL NODE BIOPSY: SHX2001

## 2021-01-23 SURGERY — MASTECTOMY WITH SENTINEL LYMPH NODE BIOPSY
Anesthesia: Regional | Site: Breast | Laterality: Right

## 2021-01-23 MED ORDER — METHYLENE BLUE 0.5 % INJ SOLN
INTRAVENOUS | Status: AC
  Filled 2021-01-23: qty 10

## 2021-01-23 MED ORDER — OXYCODONE HCL 5 MG PO TABS
5.0000 mg | ORAL_TABLET | ORAL | Status: DC | PRN
  Administered 2021-01-23: 10 mg via ORAL
  Administered 2021-01-23 – 2021-01-24 (×3): 5 mg via ORAL
  Filled 2021-01-23 (×3): qty 1

## 2021-01-23 MED ORDER — FENTANYL CITRATE (PF) 250 MCG/5ML IJ SOLN
INTRAMUSCULAR | Status: AC
  Filled 2021-01-23: qty 5

## 2021-01-23 MED ORDER — DEXAMETHASONE SODIUM PHOSPHATE 10 MG/ML IJ SOLN
INTRAMUSCULAR | Status: DC | PRN
  Administered 2021-01-23: 5 mg

## 2021-01-23 MED ORDER — SCOPOLAMINE 1 MG/3DAYS TD PT72
1.0000 | MEDICATED_PATCH | TRANSDERMAL | Status: DC
  Administered 2021-01-23: 1.5 mg via TRANSDERMAL
  Filled 2021-01-23: qty 1

## 2021-01-23 MED ORDER — KETOROLAC TROMETHAMINE 15 MG/ML IJ SOLN
15.0000 mg | INTRAMUSCULAR | Status: AC
  Administered 2021-01-23: 15 mg via INTRAVENOUS
  Filled 2021-01-23: qty 1

## 2021-01-23 MED ORDER — SODIUM CHLORIDE 0.9 % IV SOLN: INTRAVENOUS | Status: DC | PRN

## 2021-01-23 MED ORDER — ONDANSETRON 4 MG PO TBDP: 4.0000 mg | ORAL_TABLET | Freq: Four times a day (QID) | ORAL | Status: DC | PRN

## 2021-01-23 MED ORDER — PROPOFOL 10 MG/ML IV BOLUS
INTRAVENOUS | Status: AC
  Filled 2021-01-23: qty 40

## 2021-01-23 MED ORDER — KETAMINE HCL 50 MG/5ML IJ SOSY
PREFILLED_SYRINGE | INTRAMUSCULAR | Status: AC
  Filled 2021-01-23: qty 5

## 2021-01-23 MED ORDER — KETOROLAC TROMETHAMINE 30 MG/ML IJ SOLN
30.0000 mg | Freq: Three times a day (TID) | INTRAMUSCULAR | Status: DC
  Administered 2021-01-23 – 2021-01-24 (×3): 30 mg via INTRAVENOUS
  Filled 2021-01-23 (×3): qty 1

## 2021-01-23 MED ORDER — METHOCARBAMOL 500 MG PO TABS
500.0000 mg | ORAL_TABLET | Freq: Four times a day (QID) | ORAL | Status: DC | PRN
  Administered 2021-01-23 – 2021-01-24 (×3): 500 mg via ORAL
  Filled 2021-01-23 (×2): qty 1

## 2021-01-23 MED ORDER — SODIUM CHLORIDE (PF) 0.9 % IJ SOLN
INTRAMUSCULAR | Status: AC
  Filled 2021-01-23: qty 10

## 2021-01-23 MED ORDER — LACTATED RINGERS IV SOLN: INTRAVENOUS | Status: DC

## 2021-01-23 MED ORDER — ACETAMINOPHEN 500 MG PO TABS
1000.0000 mg | ORAL_TABLET | ORAL | Status: AC
  Administered 2021-01-23: 1000 mg via ORAL
  Filled 2021-01-23: qty 2

## 2021-01-23 MED ORDER — MORPHINE SULFATE (PF) 2 MG/ML IV SOLN
2.0000 mg | INTRAVENOUS | Status: DC | PRN
Start: 2021-01-23 — End: 2021-01-24

## 2021-01-23 MED ORDER — MIDAZOLAM HCL 2 MG/2ML IJ SOLN
INTRAMUSCULAR | Status: AC
  Filled 2021-01-23: qty 2

## 2021-01-23 MED ORDER — LIDOCAINE 2% (20 MG/ML) 5 ML SYRINGE
INTRAMUSCULAR | Status: DC | PRN
  Administered 2021-01-23: 20 mg via INTRAVENOUS

## 2021-01-23 MED ORDER — SODIUM CHLORIDE 0.9 % IV SOLN
Freq: Once | INTRAVENOUS | Status: DC
  Filled 2021-01-23: qty 10

## 2021-01-23 MED ORDER — FENTANYL CITRATE (PF) 100 MCG/2ML IJ SOLN
25.0000 ug | INTRAMUSCULAR | Status: DC | PRN
  Administered 2021-01-23 (×2): 50 ug via INTRAVENOUS

## 2021-01-23 MED ORDER — SUGAMMADEX SODIUM 200 MG/2ML IV SOLN
INTRAVENOUS | Status: DC | PRN
  Administered 2021-01-23: 200 mg via INTRAVENOUS

## 2021-01-23 MED ORDER — ROPIVACAINE HCL 5 MG/ML IJ SOLN
INTRAMUSCULAR | Status: DC | PRN
  Administered 2021-01-23: 30 mL via PERINEURAL

## 2021-01-23 MED ORDER — ORAL CARE MOUTH RINSE: 15.0000 mL | Freq: Once | OROMUCOSAL | Status: AC

## 2021-01-23 MED ORDER — PHENYLEPHRINE HCL-NACL 10-0.9 MG/250ML-% IV SOLN
INTRAVENOUS | Status: DC | PRN
  Administered 2021-01-23: 30 ug/min via INTRAVENOUS

## 2021-01-23 MED ORDER — APREPITANT 40 MG PO CAPS
40.0000 mg | ORAL_CAPSULE | Freq: Once | ORAL | Status: AC
  Administered 2021-01-23: 40 mg via ORAL
  Filled 2021-01-23: qty 1

## 2021-01-23 MED ORDER — DIPHENHYDRAMINE HCL 50 MG/ML IJ SOLN
INTRAMUSCULAR | Status: DC | PRN
  Administered 2021-01-23: 12.5 mg via INTRAVENOUS

## 2021-01-23 MED ORDER — TECHNETIUM TC 99M TILMANOCEPT KIT
1.0000 | PACK | Freq: Once | INTRAVENOUS | Status: AC | PRN
  Administered 2021-01-23: 1 via INTRADERMAL

## 2021-01-23 MED ORDER — HEMOSTATIC AGENTS (NO CHARGE) OPTIME
TOPICAL | Status: DC | PRN
  Administered 2021-01-23: 1 via TOPICAL

## 2021-01-23 MED ORDER — CEFAZOLIN SODIUM-DEXTROSE 2-4 GM/100ML-% IV SOLN
2.0000 g | INTRAVENOUS | Status: AC
  Administered 2021-01-23: 2 g via INTRAVENOUS
  Filled 2021-01-23: qty 100

## 2021-01-23 MED ORDER — FENTANYL CITRATE (PF) 250 MCG/5ML IJ SOLN
INTRAMUSCULAR | Status: DC | PRN
  Administered 2021-01-23 (×3): 50 ug via INTRAVENOUS
  Administered 2021-01-23: 100 ug via INTRAVENOUS

## 2021-01-23 MED ORDER — MIDAZOLAM HCL 5 MG/5ML IJ SOLN
INTRAMUSCULAR | Status: DC | PRN
  Administered 2021-01-23: 2 mg via INTRAVENOUS

## 2021-01-23 MED ORDER — SERTRALINE HCL 50 MG PO TABS: 50.0000 mg | ORAL_TABLET | Freq: Every morning | ORAL | Status: DC

## 2021-01-23 MED ORDER — ACETAMINOPHEN 500 MG PO TABS
1000.0000 mg | ORAL_TABLET | Freq: Four times a day (QID) | ORAL | Status: DC
  Administered 2021-01-23 – 2021-01-24 (×3): 1000 mg via ORAL
  Filled 2021-01-23 (×3): qty 2

## 2021-01-23 MED ORDER — PROPOFOL 10 MG/ML IV BOLUS
INTRAVENOUS | Status: DC | PRN
  Administered 2021-01-23: 150 mg via INTRAVENOUS

## 2021-01-23 MED ORDER — DEXAMETHASONE SODIUM PHOSPHATE 10 MG/ML IJ SOLN
INTRAMUSCULAR | Status: DC | PRN
  Administered 2021-01-23: 10 mg via INTRAVENOUS

## 2021-01-23 MED ORDER — CHLORHEXIDINE GLUCONATE 0.12 % MT SOLN
15.0000 mL | Freq: Once | OROMUCOSAL | Status: AC
  Administered 2021-01-23: 15 mL via OROMUCOSAL
  Filled 2021-01-23: qty 15

## 2021-01-23 MED ORDER — SIMETHICONE 80 MG PO CHEW: 40.0000 mg | CHEWABLE_TABLET | Freq: Four times a day (QID) | ORAL | Status: DC | PRN

## 2021-01-23 MED ORDER — ROCURONIUM BROMIDE 10 MG/ML (PF) SYRINGE
PREFILLED_SYRINGE | INTRAVENOUS | Status: DC | PRN
  Administered 2021-01-23: 40 mg via INTRAVENOUS
  Administered 2021-01-23: 100 mg via INTRAVENOUS
  Administered 2021-01-23: 40 mg via INTRAVENOUS
  Administered 2021-01-23: 20 mg via INTRAVENOUS

## 2021-01-23 MED ORDER — ONDANSETRON HCL 4 MG/2ML IJ SOLN
INTRAMUSCULAR | Status: DC | PRN
  Administered 2021-01-23: 4 mg via INTRAVENOUS

## 2021-01-23 MED ORDER — OXYCODONE HCL 5 MG PO TABS
ORAL_TABLET | ORAL | Status: AC
  Filled 2021-01-23: qty 2

## 2021-01-23 MED ORDER — CEFAZOLIN SODIUM-DEXTROSE 2-4 GM/100ML-% IV SOLN
2.0000 g | Freq: Three times a day (TID) | INTRAVENOUS | Status: DC
  Administered 2021-01-23 – 2021-01-24 (×2): 2 g via INTRAVENOUS
  Filled 2021-01-23 (×3): qty 100

## 2021-01-23 MED ORDER — KETAMINE HCL 10 MG/ML IJ SOLN
INTRAMUSCULAR | Status: DC | PRN
  Administered 2021-01-23 (×2): 10 mg via INTRAVENOUS
  Administered 2021-01-23: 30 mg via INTRAVENOUS

## 2021-01-23 MED ORDER — ENSURE PRE-SURGERY PO LIQD: 296.0000 mL | Freq: Once | ORAL | Status: DC

## 2021-01-23 MED ORDER — EPHEDRINE SULFATE-NACL 50-0.9 MG/10ML-% IV SOSY
PREFILLED_SYRINGE | INTRAVENOUS | Status: DC | PRN
  Administered 2021-01-23: 10 mg via INTRAVENOUS

## 2021-01-23 MED ORDER — METHOCARBAMOL 500 MG PO TABS
ORAL_TABLET | ORAL | Status: AC
  Filled 2021-01-23: qty 1

## 2021-01-23 MED ORDER — ONDANSETRON HCL 4 MG/2ML IJ SOLN: 4.0000 mg | Freq: Four times a day (QID) | INTRAMUSCULAR | Status: DC | PRN

## 2021-01-23 MED ORDER — 0.9 % SODIUM CHLORIDE (POUR BTL) OPTIME
TOPICAL | Status: DC | PRN
  Administered 2021-01-23: 2000 mL

## 2021-01-23 MED ORDER — SODIUM CHLORIDE 0.9 % IV SOLN: INTRAVENOUS | Status: DC

## 2021-01-23 MED ORDER — FENTANYL CITRATE (PF) 100 MCG/2ML IJ SOLN
INTRAMUSCULAR | Status: AC
  Filled 2021-01-23: qty 2

## 2021-01-23 MED ORDER — LEVOTHYROXINE SODIUM 75 MCG PO TABS
175.0000 ug | ORAL_TABLET | Freq: Every day | ORAL | Status: DC
  Administered 2021-01-24: 175 ug via ORAL
  Filled 2021-01-23: qty 1

## 2021-01-23 SURGICAL SUPPLY — 86 items
ADH SKN CLS APL DERMABOND .7 (GAUZE/BANDAGES/DRESSINGS) ×1
ALLOGRAFT PERF 16X20 1.6+/-0.4 (Tissue) ×1 IMPLANT
APL PRP STRL LF DISP 70% ISPRP (MISCELLANEOUS) ×1
APPLIER CLIP 9.375 MED OPEN (MISCELLANEOUS) ×2
APR CLP MED 9.3 20 MLT OPN (MISCELLANEOUS) ×1
BAG DECANTER FOR FLEXI CONT (MISCELLANEOUS) ×2 IMPLANT
BINDER BREAST LRG (GAUZE/BANDAGES/DRESSINGS) IMPLANT
BINDER BREAST XLRG (GAUZE/BANDAGES/DRESSINGS) ×1 IMPLANT
CANISTER SUCT 3000ML PPV (MISCELLANEOUS) ×4 IMPLANT
CHLORAPREP W/TINT 26 (MISCELLANEOUS) ×3 IMPLANT
CLIP APPLIE 9.375 MED OPEN (MISCELLANEOUS) IMPLANT
CNTNR URN SCR LID CUP LEK RST (MISCELLANEOUS) ×1 IMPLANT
CONT SPEC 4OZ STRL OR WHT (MISCELLANEOUS) ×2
COVER PROBE W GEL 5X96 (DRAPES) ×2 IMPLANT
COVER SURGICAL LIGHT HANDLE (MISCELLANEOUS) ×3 IMPLANT
COVER WAND RF STERILE (DRAPES) ×2 IMPLANT
DERMABOND ADVANCED (GAUZE/BANDAGES/DRESSINGS) ×1
DERMABOND ADVANCED .7 DNX12 (GAUZE/BANDAGES/DRESSINGS) ×3 IMPLANT
DRAIN CHANNEL 15F RND FF W/TCR (WOUND CARE) ×1 IMPLANT
DRAIN CHANNEL 19F RND (DRAIN) ×2 IMPLANT
DRAPE HALF SHEET 40X57 (DRAPES) ×2 IMPLANT
DRAPE INCISE IOBAN 66X45 STRL (DRAPES) ×1 IMPLANT
DRAPE LAPAROSCOPIC ABDOMINAL (DRAPES) ×1 IMPLANT
DRAPE ORTHO SPLIT 77X108 STRL (DRAPES) ×6
DRAPE SURG ORHT 6 SPLT 77X108 (DRAPES) ×2 IMPLANT
DRAPE WARM FLUID 44X44 (DRAPES) ×2 IMPLANT
DRSG PAD ABDOMINAL 8X10 ST (GAUZE/BANDAGES/DRESSINGS) ×4 IMPLANT
DRSG TEGADERM 4X4.75 (GAUZE/BANDAGES/DRESSINGS) ×5 IMPLANT
ELECT BLADE 4.0 EZ CLEAN MEGAD (MISCELLANEOUS)
ELECT CAUTERY BLADE 6.4 (BLADE) ×4 IMPLANT
ELECT COATED BLADE 2.86 ST (ELECTRODE) ×2 IMPLANT
ELECT REM PT RETURN 9FT ADLT (ELECTROSURGICAL) ×4
ELECTRODE BLDE 4.0 EZ CLN MEGD (MISCELLANEOUS) ×2 IMPLANT
ELECTRODE REM PT RTRN 9FT ADLT (ELECTROSURGICAL) ×2 IMPLANT
EVACUATOR SILICONE 100CC (DRAIN) ×3 IMPLANT
EXPANDER TISSUE FV FOURTE 400 (Prosthesis & Implant Plastic) IMPLANT
GAUZE SPONGE 4X4 12PLY STRL (GAUZE/BANDAGES/DRESSINGS) ×2 IMPLANT
GLOVE BIO SURGEON STRL SZ 6 (GLOVE) ×6 IMPLANT
GLOVE BIO SURGEON STRL SZ7 (GLOVE) ×2 IMPLANT
GLOVE BIOGEL PI IND STRL 7.5 (GLOVE) ×1 IMPLANT
GLOVE BIOGEL PI INDICATOR 7.5 (GLOVE) ×1
GOWN STRL REUS W/ TWL LRG LVL3 (GOWN DISPOSABLE) ×5 IMPLANT
GOWN STRL REUS W/TWL LRG LVL3 (GOWN DISPOSABLE) ×10
HEMOSTAT ARISTA ABSORB 3G PWDR (HEMOSTASIS) ×1 IMPLANT
KIT BASIN OR (CUSTOM PROCEDURE TRAY) ×4 IMPLANT
KIT TURNOVER KIT B (KITS) ×3 IMPLANT
LIGHT WAVEGUIDE WIDE FLAT (MISCELLANEOUS) ×1 IMPLANT
MARKER SKIN DUAL TIP RULER LAB (MISCELLANEOUS) ×3 IMPLANT
NDL 18GX1X1/2 (RX/OR ONLY) (NEEDLE) IMPLANT
NDL FILTER BLUNT 18X1 1/2 (NEEDLE) IMPLANT
NDL HYPO 25GX1X1/2 BEV (NEEDLE) IMPLANT
NEEDLE 18GX1X1/2 (RX/OR ONLY) (NEEDLE) IMPLANT
NEEDLE FILTER BLUNT 18X 1/2SAF (NEEDLE)
NEEDLE FILTER BLUNT 18X1 1/2 (NEEDLE) IMPLANT
NEEDLE HYPO 25GX1X1/2 BEV (NEEDLE) IMPLANT
NS IRRIG 1000ML POUR BTL (IV SOLUTION) ×6 IMPLANT
PACK GENERAL/GYN (CUSTOM PROCEDURE TRAY) ×3 IMPLANT
PAD ARMBOARD 7.5X6 YLW CONV (MISCELLANEOUS) ×3 IMPLANT
PENCIL SMOKE EVACUATOR (MISCELLANEOUS) ×2 IMPLANT
PIN SAFETY STERILE (MISCELLANEOUS) ×4 IMPLANT
SET ASEPTIC TRANSFER (MISCELLANEOUS) ×1 IMPLANT
SOL PREP POV-IOD 4OZ 10% (MISCELLANEOUS) ×2 IMPLANT
SPECIMEN JAR X LARGE (MISCELLANEOUS) ×2 IMPLANT
SPONGE LAP 18X18 RF (DISPOSABLE) ×3 IMPLANT
STAPLER VISISTAT 35W (STAPLE) ×2 IMPLANT
STRIP CLOSURE SKIN 1/2X4 (GAUZE/BANDAGES/DRESSINGS) ×2 IMPLANT
SUT CHROMIC 4 0 PS 2 18 (SUTURE) ×3 IMPLANT
SUT ETHILON 2 0 FS 18 (SUTURE) ×2 IMPLANT
SUT MNCRL AB 4-0 PS2 18 (SUTURE) ×3 IMPLANT
SUT SILK 2 0 SH (SUTURE) ×1 IMPLANT
SUT VIC AB 0 CT2 27 (SUTURE) ×2 IMPLANT
SUT VIC AB 3-0 54X BRD REEL (SUTURE) ×1 IMPLANT
SUT VIC AB 3-0 BRD 54 (SUTURE) ×2
SUT VIC AB 3-0 SH 18 (SUTURE) ×1 IMPLANT
SUT VIC AB 3-0 SH 27 (SUTURE) ×4
SUT VIC AB 3-0 SH 27X BRD (SUTURE) IMPLANT
SUT VIC AB 3-0 SH 8-18 (SUTURE) ×2 IMPLANT
SUT VICRYL 4-0 PS2 18IN ABS (SUTURE) ×1 IMPLANT
SUT VLOC 180 0 24IN GS25 (SUTURE) ×1 IMPLANT
SYR 50ML LL SCALE MARK (SYRINGE) ×1 IMPLANT
SYR BULB IRRIG 60ML STRL (SYRINGE) ×2 IMPLANT
SYR CONTROL 10ML LL (SYRINGE) IMPLANT
TISSUE EXPNDR FV FOURTE 400 (Prosthesis & Implant Plastic) ×2 IMPLANT
TOWEL GREEN STERILE (TOWEL DISPOSABLE) ×4 IMPLANT
TOWEL GREEN STERILE FF (TOWEL DISPOSABLE) ×4 IMPLANT
TRAY FOLEY MTR SLVR 16FR STAT (SET/KITS/TRAYS/PACK) IMPLANT

## 2021-01-23 NOTE — Anesthesia Preprocedure Evaluation (Addendum)
Anesthesia Evaluation  Patient identified by MRN, date of birth, ID band Patient awake    Reviewed: Allergy & Precautions, NPO status , Patient's Chart, lab work & pertinent test results  History of Anesthesia Complications (+) PONV  Airway Mallampati: I  TM Distance: >3 FB Neck ROM: Full    Dental no notable dental hx. (+) Teeth Intact, Dental Advisory Given   Pulmonary neg pulmonary ROS,    Pulmonary exam normal breath sounds clear to auscultation       Cardiovascular negative cardio ROS Normal cardiovascular exam Rhythm:Regular Rate:Normal     Neuro/Psych PSYCHIATRIC DISORDERS Anxiety negative neurological ROS     GI/Hepatic Neg liver ROS, GERD  Medicated and Controlled,  Endo/Other  Hypothyroidism   Renal/GU negative Renal ROS  negative genitourinary   Musculoskeletal negative musculoskeletal ROS (+)   Abdominal   Peds  Hematology negative hematology ROS (+)   Anesthesia Other Findings   Reproductive/Obstetrics                            Anesthesia Physical Anesthesia Plan  ASA: III  Anesthesia Plan: General and Regional   Post-op Pain Management:  Regional for Post-op pain   Induction: Intravenous  PONV Risk Score and Plan: 4 or greater and Midazolam, Dexamethasone and Ondansetron  Airway Management Planned: Oral ETT  Additional Equipment:   Intra-op Plan:   Post-operative Plan: Extubation in OR  Informed Consent: I have reviewed the patients History and Physical, chart, labs and discussed the procedure including the risks, benefits and alternatives for the proposed anesthesia with the patient or authorized representative who has indicated his/her understanding and acceptance.     Dental advisory given  Plan Discussed with: CRNA  Anesthesia Plan Comments:         Anesthesia Quick Evaluation

## 2021-01-23 NOTE — Op Note (Signed)
Operative Note   DATE OF OPERATION: 4.15.22  LOCATION:  Main OR-observation  SURGICAL DIVISION: Plastic Surgery  PREOPERATIVE DIAGNOSES:  1. Right breast cancer UOQ ER+  POSTOPERATIVE DIAGNOSES:  same  PROCEDURE:  1. Right breast reconstruction with tissue expander 2. Acellular dermis (Alloderm) to right chest 300 cm2  SURGEON: Irene Limbo MD MBA  ASSISTANT: Gentry Fitz RNFA  ANESTHESIA:  General.   EBL: 500 ml for entire procedure  COMPLICATIONS: None immediate.   INDICATIONS FOR PROCEDURE:  The patient, Phyllis Harris, is a 40 y.o. female born on [birth year], is here for immediate right prepectoral tissue expander acelluar dermis reconstruction following skin sparing mastectomy.   FINDINGS: Natrelle 133S FV-12-T 400 ml tissue expander placed, initial fill volume 100 ml air. SN [id number]  DESCRIPTION OF PROCEDURE:  The patient was marked to mark sternal notch, chest midline, anterior axillary lines and inframammary folds. Patient was marked for mastectomy with triangular shaped incision with most superior portion nipple areola marked on breast meridian. Vertical limbs marked at lateral border areolae.The patient was taken to the operating room. SCDs were placed and IV antibiotics were given. Foley catheter placed. The patient's operative site was prepped and draped in a sterile fashion. A time out was performed and all information was confirmed to be correct. I assisted in mastectomy with exposure and retraction. Following completion of mastectomy, in supine position, the lateral limbs for resection marked and area over lower pole preserved as inferiorly based dermal pedicle. Skin de epithelialized in this area.  The cavity was irrigated with solution containingAncef, gentamicin, and Betadine. Hemostasis was ensured. A 19 Fr drain was placed in subcutaneous position laterally anda 15 Fr drain placed along inframammary fold. Eachsecured to skin with 2-0 nylon. The tissue  expander was prepared on back table prior in insertion. The expander was filled with air.Perforated acellular dermis was draped over anterior surface expander. The ADM was then secured to itself over posterior surface of expanderwith 4-0 chromic. Redundant folds acellular dermis excised so that the ADM layflat without folds over air filled expander.The expander was secured to medial insertion pectoralis with a 0 vicryl.The superior and lateral tabs also secured to pectoralis muscle with 0-vicryl. The ADM was secured to pectoralis muscle and chest wall at inframammary foldwith 0Vlock suture.Laterally the mastectomy flap over posterior axillary line was advanced anteriorly and the subcutaneous tissue and superficial fascia was secured tochest wallwith 0-vicryl. The inferiorly based dermal pedicle was redraped superiorly over expander and acellular dermis and secured to pectoralis with interrupted 0-vicryl. Skin closure completedwith 3-0 vicryl in fascial layer and 4-0 vicryl in dermis. Skin closure completed with 4-0 monocryl subcuticular and tissue adhesive.  Tegaderms applied bilateral, followed by dry dressing and breast binder.  The patient was allowed to wake from anesthesia, extubated and taken to the recovery room in satisfactory condition.   SPECIMENS: none  DRAINS: 15 and 19 Fr JP in right breast reconstruction

## 2021-01-23 NOTE — H&P (Signed)
  50 yof here because of recent diagnosis of invasive mammary carcinoma of the right breast. she noted a right breast mass on her exam. Diagnostic mammogram and [place] on 12/25/20 showed calcifications in the upper outer and upper inner right breast, 9.7cm, and multiple masses from the 9-2 o'clock position, 1.8cm, 3.3cm, 1.1cm, 2.8cm, 0.8cm, and 2 abnormal lymph nodes in the right axilla. Biopsy on 12/26/20 showed invasive mammary carcinoma in the breast and axilla, grade 2. this is er/pr pos and her 2 negative. she is here with her husband. she has no fh of breast cancer. 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 Synthroid (175MCG Tablet, Oral) Active. Zoloft (50MG  Tablet, Oral) Active.  Social History Rolm Bookbinder, MD; 01/09/2021 8:51 AM) Current tobacco use  Never smoker.   Review of Systems Rolm Bookbinder MD; 01/09/2021 8:56 AM) Breast Present- Breast Mass. All other systems negative   Physical Exam Rolm Bookbinder MD; 01/09/2021 8:52 AM) General Mental Status-Alert. Orientation-Oriented X3. Breast Nipples-No Discharge. Note: left breast without mass right breast with large right breast mass, alot of bruising from recent biopsy   Assessment & Plan Rolm Bookbinder MD; 01/09/2021 8:56 AM) BREAST CANCER METASTASIZED TO AXILLARY LYMPH NODE, RIGHT (C50.911) Story: Right SSM, right TAD We discussed the staging and pathophysiology of breast cancer. We discussed all of the different options for treatment for breast cancer including surgery, chemotherapy, radiation therapy, Herceptin, and antiestrogen therapy. I dont think  lumpectomy is option even if we did primary systemic therapy. I dont favor antiestrogen therapy primarily in this premenopausal women and i dont think this will shrink much with chemotherapy even if she does end up receiving it. I think surgery first would get rid of disease and give other information to oncology on what to proceed with. I dont think she is candidate for nsm either. we discussed ssm and possible reconstruction although she almost certainly will get recommended radiotherapy for this tumor. we discussed ssm today and will await plastics appt. she has two nodes, will await mri report. certainly alnd is likely gold standard but I think at TAD with removing pos node and sn and anything palpable is reasonable. as long as macroscopic disease removed and she has radiotherapy I dont think advantage necessarily to dissecting axilla. this is a possibility though both at time of surgery depending on findings as well as after we get final path back. We discussed the risks of operation including bleeding, infection, possible reoperation. She understands her further therapy will be based on what her stages at the time of her operation.

## 2021-01-23 NOTE — Op Note (Signed)
Preoperative diagnosis: Stage 2 right breast cancer Postoperative diagnosis: saa Procedure:  1. Right skin sparing mastectomy 2. Right axillary seed containing node excision 3. Lower level right axillary node dissection Surgeon: Dr Serita Grammes    Anesthesia: general with pectoral blocks EBL: 75 cc Drains per plastic surgery Complications none Specimens: 1. Right breast tissue marked short superior, long lateral 2. Right axillary node (seed containing) 3. Right axillary nodes Sponge and needle count dispo recovery stable  Indications: 29 yof here because of recent diagnosis of invasive mammary carcinoma of the right breast. Diagnostic mammogram and [place] on 12/25/20 showed calcifications in the upper outer and upper inner right breast, 9.7cm, and multiple masses from the 9-2 o'clock position, 1.8cm, 3.3cm, 1.1cm, 2.8cm, 0.8cm, and 2 abnormal lymph nodes in the right axilla. Biopsy on 12/26/20 showed invasive mammary carcinoma in the breast and axilla, grade 2. this is er/pr pos and her 2 negative. We discussed total mastectomy and targeted node dissection.  She will have expander at same time.  Procedure:  After informed consent she underwent pectoral blocks and lymphoseek was injected in standard periareolar fashion.  She was given antibiotics and SCDs were in place. She was placed under general anesthesia She was prepped and draped in the standard sterile surgical fashion.  A surgical timeout was performed.   Incision lines were drawn by Dr Iran Planas.I made an incision to include the nipple and areola. I then created flaps to the clavicle, parasternal region and latissimus.  I removed the breast and the pectoralis fascia.  I then marked as above. I obtained hemostasis. I packed this side and went to the other side The axillary node containing the seed was in the axillary tail.  The lymphoseek gave a diffuse low level signal and was not very helpful.  The seed containing node was not a  sentinel node by criteria.  Faxitron confirmed removal of the seed and clip.  I then did remove the lower axillary nodes. This would be a low level axillary dissection.  There were no more palpable nodes and I dont think any more nodal surgery is indicated now or later.  I then obtained hemostasis.  I placed Arista in the axilla. Case was then turned over to plastic surgery    I then turned the case over to plastic surgery for reconstruction

## 2021-01-23 NOTE — Progress Notes (Signed)
Patient arrived to room 6N13 from PACU. Patient is alert and oriented times 4. VSS. NAD. Belongings and call bell within reach. Bed is in the lowest and locked position with bed rails up times 2. Patient oriented to unit and call bell. Patient updated as to plan of care and all questions addressed at this time.

## 2021-01-23 NOTE — Interval H&P Note (Signed)
History and Physical Interval Note:  01/23/2021 7:30 AM  Phyllis Harris  has presented today for surgery, with the diagnosis of RIGHT BREAST CANCER.  The various methods of treatment have been discussed with the patient and family. After consideration of risks, benefits and other options for treatment, the patient has consented to  Procedure(s): RIGHT MASTECTOMY WITH AXILLARY SENTINEL LYMPH NODE BIOPSY (Right) RADIOACTIVE SEED GUIDED RIGHT AXILLARY SENTINEL LYMPH NODE EXCISION (Right) BREAST RECONSTRUCTION WITH PLACEMENT OF TISSUE EXPANDER AND ALLODERM (Right) as a surgical intervention.  The patient's history has been reviewed, patient examined, no change in status, stable for surgery.  I have reviewed the patient's chart and labs.  Questions were answered to the patient's satisfaction.     Rolm Bookbinder

## 2021-01-23 NOTE — Anesthesia Procedure Notes (Signed)
Procedure Name: Intubation Date/Time: 01/23/2021 7:42 AM Performed by: Griffin Dakin, CRNA Pre-anesthesia Checklist: Patient identified, Emergency Drugs available, Suction available and Patient being monitored Patient Re-evaluated:Patient Re-evaluated prior to induction Oxygen Delivery Method: Circle system utilized Preoxygenation: Pre-oxygenation with 100% oxygen Induction Type: IV induction Ventilation: Mask ventilation without difficulty Laryngoscope Size: Mac and 4 Grade View: Grade II Tube type: Oral Tube size: 7.0 mm Number of attempts: 1 Airway Equipment and Method: Stylet and Oral airway Placement Confirmation: ETT inserted through vocal cords under direct vision,  positive ETCO2 and breath sounds checked- equal and bilateral Secured at: 23 cm Tube secured with: Tape Dental Injury: Teeth and Oropharynx as per pre-operative assessment

## 2021-01-23 NOTE — Anesthesia Procedure Notes (Signed)
Anesthesia Regional Block: Pectoralis block   Pre-Anesthetic Checklist: ,, timeout performed, Correct Patient, Correct Site, Correct Laterality, Correct Procedure, Correct Position, site marked, Risks and benefits discussed,  Surgical consent,  Pre-op evaluation,  At surgeon's request and post-op pain management  Laterality: Right  Prep: Maximum Sterile Barrier Precautions used, chloraprep       Needles:  Injection technique: Single-shot  Needle Type: Echogenic Stimulator Needle     Needle Length: 9cm  Needle Gauge: 22     Additional Needles:   Procedures:,,,, ultrasound used (permanent image in chart),,,,  Narrative:  Start time: 01/23/2021 7:00 AM End time: 01/23/2021 7:10 AM Injection made incrementally with aspirations every 5 mL.  Performed by: Personally  Anesthesiologist: Freddrick March, MD  Additional Notes: Monitors applied. No increased pain on injection. No increased resistance to injection. Injection made in 5cc increments. Good needle visualization. Patient tolerated procedure well.

## 2021-01-23 NOTE — Transfer of Care (Signed)
Immediate Anesthesia Transfer of Care Note  Patient: Phyllis Harris  Procedure(s) Performed: RIGHT MASTECTOMY WITH AXILLARY SENTINEL LYMPH NODE BIOPSY (Right Breast) RADIOACTIVE SEED GUIDED RIGHT AXILLARY SENTINEL LYMPH NODE EXCISION (Right Breast) BREAST RECONSTRUCTION WITH PLACEMENT OF TISSUE EXPANDER AND ALLODERM (Right Breast)  Patient Location: PACU  Anesthesia Type:General  Level of Consciousness: awake, alert  and oriented  Airway & Oxygen Therapy: Patient Spontanous Breathing  Post-op Assessment: Report given to RN and Post -op Vital signs reviewed and stable  Post vital signs: Reviewed and stable  Last Vitals:  Vitals Value Taken Time  BP 111/58 01/23/21 1039  Temp    Pulse 82 01/23/21 1040  Resp 18 01/23/21 1040  SpO2 96 % 01/23/21 1040  Vitals shown include unvalidated device data.  Last Pain:  Vitals:   01/23/21 0554  TempSrc:   PainSc: 0-No pain         Complications: No complications documented.

## 2021-01-23 NOTE — Anesthesia Postprocedure Evaluation (Signed)
Anesthesia Post Note  Patient: Phyllis Harris  Procedure(s) Performed: RIGHT MASTECTOMY WITH AXILLARY SENTINEL LYMPH NODE BIOPSY (Right Breast) RADIOACTIVE SEED GUIDED RIGHT AXILLARY SENTINEL LYMPH NODE EXCISION (Right Breast) BREAST RECONSTRUCTION WITH PLACEMENT OF TISSUE EXPANDER AND ALLODERM (Right Breast)     Patient location during evaluation: PACU Anesthesia Type: General Level of consciousness: awake and alert Pain management: pain level controlled Vital Signs Assessment: post-procedure vital signs reviewed and stable Respiratory status: spontaneous breathing, nonlabored ventilation, respiratory function stable and patient connected to nasal cannula oxygen Cardiovascular status: blood pressure returned to baseline and stable Postop Assessment: no apparent nausea or vomiting Anesthetic complications: no   No complications documented.  Last Vitals:  Vitals:   01/23/21 1110 01/23/21 1125  BP: (!) 99/53 (!) 103/54  Pulse: 71 73  Resp: 18 16  Temp:    SpO2: 95% 96%    Last Pain:  Vitals:   01/23/21 1125  TempSrc:   PainSc: [name]

## 2021-01-23 NOTE — Interval H&P Note (Signed)
History and Physical Interval Note:  01/23/2021 6:51 AM  Phyllis Harris  has presented today for surgery, with the diagnosis of RIGHT BREAST CANCER.  The various methods of treatment have been discussed with the patient and family. After consideration of risks, benefits and other options for treatment, the patient has consented to  Procedure(s): RIGHT MASTECTOMY WITH AXILLARY SENTINEL LYMPH NODE BIOPSY (Right) RADIOACTIVE SEED GUIDED RIGHT AXILLARY SENTINEL LYMPH NODE EXCISION (Right) BREAST RECONSTRUCTION WITH PLACEMENT OF TISSUE EXPANDER AND ALLODERM (Right) as a surgical intervention.  The patient's history has been reviewed, patient examined, no change in status, stable for surgery.  I have reviewed the patient's chart and labs.  Questions were answered to the patient's satisfaction.     Arnoldo Hooker Saverio Kader

## 2021-01-24 ENCOUNTER — Other Ambulatory Visit: Payer: Self-pay | Admitting: Hematology and Oncology

## 2021-01-24 DIAGNOSIS — D0511 Intraductal carcinoma in situ of right breast: Secondary | ICD-10-CM | POA: Diagnosis not present

## 2021-01-24 NOTE — Discharge Summary (Signed)
Physician Discharge Summary  Patient ID: Phyllis Harris MRN: [id number] DOB/AGE: 03-26-1981 40 y.o.  Admit date: 01/23/2021 Discharge date: 01/24/2021  Admission Diagnoses: Right breast cancer  Discharge Diagnoses:  Active Problems:   S/P mastectomy, right   Discharged Condition: good  Hospital Course: 40 yof underwent right mastectomy/axillary node clearance doing well. Will be discharged home  Consults: None  Significant Diagnostic Studies: none  Treatments: surgery: mastectomy  Discharge Exam: Blood pressure 108/63, pulse 68, temperature 98.2 F (36.8 C), temperature source Oral, resp. rate 17, height 5\' 8"  (1.727 m), weight 95.7 kg, SpO2 99 %. incision without infection, some ecchymosis, drains as expected  Disposition: Discharge disposition: 01-Home or Self Care       Discharge Instructions    Call MD for:  redness, tenderness, or signs of infection (pain, swelling, bleeding, redness, odor or green/yellow discharge around incision site)   Complete by: As directed    Call MD for:  temperature >100.5   Complete by: As directed    Discharge instructions   Complete by: As directed    Ok to remove dressings and shower am 4.17.22. Soap and water ok, pat Tegaderms dry. Do not remove Tegaderms. No creams or ointments over incisions. Do not let drains dangle in shower, attach to lanyard or similar. Strip and record drains twice daily and bring log to clinic visit.  Breast binder or soft compression bra all other times.  Ok to raise arms above shoulders for bathing and dressing.  No house yard work or exercise until cleared by MD.   Recommend ibuprofen with meals to aid with pain control. Also ok to use Tylenol for pain. Recommend Miralax or Dulcolax as needed for constipation. Patient received all Rx preop.   Driving Restrictions   Complete by: As directed    No driving for 2 weeks then no driving if taking narcotics   Lifting restrictions   Complete by: As  directed    No lifting > 5 lbs until cleared by MD   Resume previous diet   Complete by: As directed      Allergies as of 01/24/2021   No Known Allergies     Medication List    TAKE these medications   levonorgestrel 20 MCG/24HR IUD Commonly known as: MIRENA 1 each by Intrauterine route once. Inserted by GYN Dr. Gaetano Net 11/04/2017, needs to be removed in 10/2022.   levothyroxine 175 MCG tablet Commonly known as: SYNTHROID Take 175 mcg by mouth daily before breakfast.   multivitamin with minerals Tabs tablet Take 1 tablet by mouth in the morning.   sertraline 50 MG tablet Commonly known as: ZOLOFT Take 50 mg by mouth in the morning.   tamoxifen 20 MG tablet Commonly known as: NOLVADEX Take 1 tablet (20 mg total) by mouth daily.       Follow-up Information    Irene Limbo, MD In 1 week.   Specialty: Plastic Surgery Why: as scheduled Contact information: [address] [place] [address] [phone]        Rolm Bookbinder, MD. Schedule an appointment as soon as possible for a visit in 2 weeks.   Specialty: General Surgery Contact information: [place] [address] [phone]               Signed: Rolm Bookbinder 01/24/2021, 7:19 AM

## 2021-01-24 NOTE — Discharge Instructions (Signed)
[facility] °[phone] ° °MASTECTOMY: POST OP INSTRUCTIONS °Take 400 mg of ibuprofen every 8 hours or 650 mg tylenol every 6 hours for next 72 hours then as needed. Use ice several times daily also. °Always review your discharge instruction sheet given to you by the facility where your surgery was performed. ° °IF YOU HAVE DISABILITY OR FAMILY LEAVE FORMS, YOU MUST BRING THEM TO THE OFFICE FOR PROCESSING.   °DO NOT GIVE THEM TO YOUR DOCTOR. °A prescription for pain medication may be given to you upon discharge.  Take your pain medication as prescribed, if needed.  If narcotic pain medicine is not needed, then you may take acetaminophen (Tylenol), naprosyn (Alleve) or ibuprofen (Advil) as needed. °1. Take your usually prescribed medications unless otherwise directed. °2. If you need a refill on your pain medication, please contact your pharmacy.  They will contact our office to request authorization.  Prescriptions will not be filled after 5pm or on week-ends. °3. You should follow a light diet the first few days after arrival home, such as soup and crackers, etc.  Resume your normal diet the day after surgery. °4. Most patients will experience some swelling and bruising on the chest and underarm.  Ice packs will help.  Swelling and bruising can take several days to resolve. Wear the binder day and night until you return to the office.  °5. It is common to experience some constipation if taking pain medication after surgery.  Increasing fluid intake and taking a stool softener (such as Colace) will usually help or prevent this problem from occurring.  A mild laxative (Milk of Magnesia or Miralax) should be taken according to package instructions if there are no bowel movements after 48 hours. °6. Unless discharge instructions indicate otherwise, leave your bandage dry and in place until your next appointment in 3-5 days.  You may take a limited sponge bath.  No tube baths or showers until the  drains are removed.  You may have steri-strips (small skin tapes) in place directly over the incision.  These strips should be left on the skin for 7-10 days. If you have glue it will come off in next couple week.  Any sutures will be removed at an office visit °7. DRAINS:  If you have drains in place, it is important to keep a list of the amount of drainage produced each day in your drains.  Before leaving the hospital, you should be instructed on drain care.  Call our office if you have any questions about your drains. I will remove your drains when they put out less than 30 cc or ml for 2 consecutive days. °8. ACTIVITIES:  You may resume regular (light) daily activities beginning the next day--such as daily self-care, walking, climbing stairs--gradually increasing activities as tolerated.  You may have sexual intercourse when it is comfortable.  Refrain from any heavy lifting or straining until approved by your doctor. °a. You may drive when you are no longer taking prescription pain medication, you can comfortably wear a seatbelt, and you can safely maneuver your car and apply brakes. °b. RETURN TO WORK:  __________________________________________________________ °9. You should see your doctor in the office for a follow-up appointment approximately 3-5 days after your surgery.  Your doctor’s nurse will typically make your follow-up appointment when she calls you with your pathology report.  Expect your pathology report 3-4business days after surgery. °10. OTHER INSTRUCTIONS: ______________________________________________________________________________________________ ____________________________________________________________________________________________ °WHEN TO CALL YOUR DR Jozelynn Danielson: °1. Fever over 101.0 °  2. Nausea and/or vomiting °3. Extreme swelling or bruising °4. Continued bleeding from incision. °5. Increased pain, redness, or drainage from the incision. °The clinic staff is available to answer your  questions during regular business hours.  Please don’t hesitate to call and ask to speak to one of the nurses for clinical concerns.  If you have a medical emergency, go to the nearest emergency room or call 911.  A surgeon from [facility] is always on call at the hospital. °[address], [address], [place], [place]  [address] ? [address] [address], [place], [place]   [address] °[phone] ? [phone] ? FAX [phone] °Web site: [email or web address] ° °

## 2021-01-24 NOTE — Plan of Care (Signed)
  Problem: Education: Goal: Knowledge of disease or condition will improve Outcome: Adequate for Discharge   Problem: Activity: Goal: Ability to maintain or regain function will improve Outcome: Adequate for Discharge   Problem: Clinical Measurements: Goal: Postoperative complications will be avoided or minimized Outcome: Adequate for Discharge   Problem: Self-Concept: Goal: Ability to verbalize positive feelings about self will improve Outcome: Adequate for Discharge   Problem: Pain Management: Goal: Expressions of feelings of enhanced comfort will increase Outcome: Adequate for Discharge   Problem: Skin Integrity: Goal: Demonstration of wound healing without infection will improve Outcome: Adequate for Discharge   Problem: Education: Goal: Knowledge of General Education information will improve Description: Including pain rating scale, medication(s)/side effects and non-pharmacologic comfort measures Outcome: Adequate for Discharge   Problem: Health Behavior/Discharge Planning: Goal: Ability to manage health-related needs will improve Outcome: Adequate for Discharge   Problem: Clinical Measurements: Goal: Ability to maintain clinical measurements within normal limits will improve Outcome: Adequate for Discharge Goal: Will remain free from infection Outcome: Adequate for Discharge Goal: Diagnostic test results will improve Outcome: Adequate for Discharge Goal: Respiratory complications will improve Outcome: Adequate for Discharge Goal: Cardiovascular complication will be avoided Outcome: Adequate for Discharge   Problem: Activity: Goal: Risk for activity intolerance will decrease Outcome: Adequate for Discharge   Problem: Nutrition: Goal: Adequate nutrition will be maintained Outcome: Adequate for Discharge   Problem: Coping: Goal: Level of anxiety will decrease Outcome: Adequate for Discharge   Problem: Elimination: Goal: Will not experience complications  related to bowel motility Outcome: Adequate for Discharge Goal: Will not experience complications related to urinary retention Outcome: Adequate for Discharge   Problem: Pain Managment: Goal: General experience of comfort will improve Outcome: Adequate for Discharge   Problem: Safety: Goal: Ability to remain free from injury will improve Outcome: Adequate for Discharge   Problem: Skin Integrity: Goal: Risk for impaired skin integrity will decrease Outcome: Adequate for Discharge

## 2021-01-24 NOTE — Progress Notes (Signed)
Discharge instructions (including medications) discussed with and copy provided to patient/caregiver. Went over instructions on JP drain care with husband, no questions or concerns from either party.

## 2021-01-24 NOTE — Progress Notes (Signed)
Received from [facility]  Patient alert and oriented x 4  Patient awake upon entering the room.  IV infusing soon to be removed as patient will be discharge early today.  Bedside table and call bell in reach for patient safety.

## 2021-01-26 ENCOUNTER — Encounter (HOSPITAL_COMMUNITY): Payer: Self-pay | Admitting: General Surgery

## 2021-01-26 LAB — SURGICAL PATHOLOGY

## 2021-01-27 ENCOUNTER — Encounter: Payer: Self-pay | Admitting: *Deleted

## 2021-01-29 ENCOUNTER — Telehealth: Payer: Self-pay

## 2021-01-29 NOTE — Telephone Encounter (Signed)
Patient approved for [employer] Medicaid for or 12 months: 12/09/2020 - 12/08/2021. MID# [id number] T. Patient informed, notification/MID# emailed to billing departments within [facility], [facility], and [facility], and Radiology. Also, on behalf of patient, provided MID number to the office of Dr. Lenis Dickinson @ [facility].

## 2021-02-02 ENCOUNTER — Telehealth: Payer: Self-pay | Admitting: *Deleted

## 2021-02-02 ENCOUNTER — Encounter: Payer: Self-pay | Admitting: *Deleted

## 2021-02-02 ENCOUNTER — Other Ambulatory Visit: Payer: Self-pay

## 2021-02-02 ENCOUNTER — Inpatient Hospital Stay: Payer: Medicaid Other | Attending: Hematology and Oncology | Admitting: Hematology and Oncology

## 2021-02-02 DIAGNOSIS — C50411 Malignant neoplasm of upper-outer quadrant of right female breast: Secondary | ICD-10-CM | POA: Diagnosis present

## 2021-02-02 DIAGNOSIS — Z9011 Acquired absence of right breast and nipple: Secondary | ICD-10-CM | POA: Diagnosis not present

## 2021-02-02 DIAGNOSIS — C773 Secondary and unspecified malignant neoplasm of axilla and upper limb lymph nodes: Secondary | ICD-10-CM | POA: Insufficient documentation

## 2021-02-02 DIAGNOSIS — Z170 Estrogen receptor positive status [ER+]: Secondary | ICD-10-CM | POA: Diagnosis not present

## 2021-02-02 NOTE — Progress Notes (Signed)
Patient Care Team: Inda Coke, [place] as PCP - General (Physician Assistant) Jacelyn Pi, MD as Referring Physician (Endocrinology) Everlene Farrier, MD as Consulting Physician (Obstetrics and Gynecology) Mauro Kaufmann, RN as Oncology Nurse Navigator Rockwell Germany, RN as Oncology Nurse Navigator  DIAGNOSIS:    ICD-10-CM   1. Malignant neoplasm of upper-outer quadrant of right breast in female, estrogen receptor positive ([place])  C50.411    Z17.0     SUMMARY OF ONCOLOGIC HISTORY: Oncology History  Malignant neoplasm of upper-outer quadrant of right female breast ([place])  12/30/2020 Initial Diagnosis   A right breast mass was palpated on clinical exam. Diagnostic mammogram and US showed calcifications in the upper outer and upper inner right breast, 9.7cm, and multiple masses from the 9-2 o'clock position, 1.8cm, 3.3cm, 1.1cm, 2.8cm, 0.8cm, and 2 abnormal lymph nodes in the right axilla. Biopsy showed invasive mammary carcinoma in the breast and axilla, grade 2. ER 100%, PR 100%, Ki-67 20%, HER-2 negative   01/01/2021 Cancer Staging   Staging form: Breast, AJCC 8th Edition - Clinical stage from 01/01/2021: Stage IIA (cT3, cN1, cM0, G2, ER+, PR+, HER2-) - Signed by Nicholas Lose, MD on 01/01/2021 Stage prefix: Initial diagnosis Histologic grading system: 3 grade system   01/08/2021 Genetic Testing   Negative hereditary cancer genetic testing: no pathogenic variants detected in [facility] Multi-Cancer Panel.  The report date is January 08, 2021.    The Multi-Gene Panel offered by [facility] includes sequencing and/or deletion duplication testing of the following 84 genes: AIP, ALK, APC, ATM, AXIN2,BAP1,  BARD1, BLM, BMPR1A, BRCA1, BRCA2, BRIP1, CASR, CDC73, CDH1, CDK4, CDKN1B, CDKN1C, CDKN2A (p14ARF), CDKN2A (p16INK4a), CEBPA, CHEK2, CTNNA1, DICER1, DIS3L2, EGFR (c.2369C>T, p.Thr790Met variant only), EPCAM (Deletion/duplication testing only), FH, FLCN, GATA2, GPC3, GREM1 (Promoter region  deletion/duplication testing only), HOXB13 (c.251G>A, p.Gly84Glu), HRAS, KIT, MAX, MEN1, MET, MITF (c.952G>A, p.Glu318Lys variant only), MLH1, MSH2, MSH3, MSH6, MUTYH, NBN, NF1, NF2, NTHL1, PALB2, PDGFRA, PHOX2B, PMS2, POLD1, POLE, POT1, PRKAR1A, PTCH1, PTEN, RAD50, RAD51C, RAD51D, RB1, RECQL4, RET, RUNX1, SDHAF2, SDHA (sequence changes only), SDHB, SDHC, SDHD, SMAD4, SMARCA4, SMARCB1, SMARCE1, STK11, SUFU, TERC, TERT, TMEM127, TP53, TSC1, TSC2, VHL, WRN and WT1.    01/23/2021 Surgery   Right mastectomy: Grade 2 IDC, 6.3 cm, intermediate grade DCIS, margins negative, carcinoma involves the dermis of the nipple, 1 intramammary lymph node positive, 2 additional lymph nodes in the right axilla positive out of 4 ER 100%, PR 100%, HER2 negative, Ki-67 20% T3N1A   02/02/2021 Cancer Staging   Staging form: Breast, AJCC 8th Edition - Pathologic stage from 02/02/2021: Stage IB (pT3, pN1a, cM0, G2, ER+, PR+, HER2-) - Signed by Nicholas Lose, MD on 02/02/2021 Histologic grading system: 3 grade system     CHIEF COMPLIANT: Follow-up s/p mastectomy   INTERVAL HISTORY: Phyllis Harris is a 40 y.o. with above-mentioned history of right breast cancer. She underwent a right mastectomy with reconstruction on 01/23/21 with Dr. Donne Hazel and Dr. Iran Planas for which pathology showed invasive ductal carcinoma, 6.3cm, grade 2, with intermediate grade DCIS, clear margins, with metastatic carcinoma in 1 intramammary and 2 right axillary lymph nodes. She presents to the clinic today to discuss the pathology report and further treatment.   ALLERGIES:  has No Known Allergies.  MEDICATIONS:  Current Outpatient Medications  Medication Sig Dispense Refill  . levonorgestrel (MIRENA) 20 MCG/24HR IUD 1 each by Intrauterine route once. Inserted by GYN Dr. Gaetano Net 11/04/2017, needs to be removed in 10/2022.    . levothyroxine (SYNTHROID, LEVOTHROID) 175  MCG tablet Take 175 mcg by mouth daily before breakfast.    . Multiple Vitamin  (MULTIVITAMIN WITH MINERALS) TABS tablet Take 1 tablet by mouth in the morning.    . sertraline (ZOLOFT) 50 MG tablet Take 50 mg by mouth in the morning.    . tamoxifen (NOLVADEX) 20 MG tablet TAKE 1 TABLET BY MOUTH EVERY DAY 30 tablet 1   No current facility-administered medications for this visit.    PHYSICAL EXAMINATION: ECOG PERFORMANCE STATUS: 1 - Symptomatic but completely ambulatory  Vitals:   02/02/21 1344  BP: 131/65  Pulse: 67  Resp: 18  Temp: 97.9 F (36.6 C)  SpO2: 98%   Filed Weights   02/02/21 1344  Weight: 209 lb 1.6 oz (94.8 kg)    BREAST: No palpable masses or nodules in either right or left breasts. No palpable axillary supraclavicular or infraclavicular adenopathy no breast tenderness or nipple discharge. (exam performed in the presence of a chaperone)  LABORATORY DATA:  I have reviewed the data as listed CMP Latest Ref Rng & Units 01/21/2021 01/01/2021 05/13/2014  Glucose 70 - 99 mg/dL 112(H) 94 133(H)  BUN 6 - 20 mg/dL '9 11 9  ' Creatinine 0.44 - 1.00 mg/dL 0.75 0.76 0.63  Sodium 135 - 145 mmol/L 141 142 137  Potassium 3.5 - 5.1 mmol/L 4.3 4.2 5.0  Chloride 98 - 111 mmol/L 107 103 101  CO2 22 - 32 mmol/L '26 27 23  ' Calcium 8.9 - 10.3 mg/dL 8.2(L) 8.9 9.0  Total Protein 6.5 - 8.1 g/dL - 7.8 -  Total Bilirubin 0.3 - 1.2 mg/dL - 0.3 -  Alkaline Phos 38 - 126 U/L - 65 -  AST 15 - 41 U/L - 25 -  ALT 0 - 44 U/L - 23 -    Lab Results  Component Value Date   WBC 6.3 01/21/2021   HGB 13.6 01/21/2021   HCT 41.3 01/21/2021   MCV 92.0 01/21/2021   PLT 286 01/21/2021   NEUTROABS 7.0 01/01/2021    ASSESSMENT & PLAN:  Malignant neoplasm of upper-outer quadrant of right female breast ([place]) 01/23/2021: Right mastectomy: Grade 2 IDC, 6.3 cm, intermediate grade DCIS, margins negative, carcinoma involves the dermis of the nipple, 1 intramammary lymph node positive, 2 additional lymph nodes in the right axilla positive out of 4 ER 100%, PR 100%, HER2 negative, Ki-67  20% T3N1A stage Ib  Pathology counseling: I discussed the final pathology report of the patient provided  a copy of this report. I discussed the margins as well as lymph node surgeries. We also discussed the final staging along with previously performed ER/PR and HER-2/neu testing.  Recommendation: 1.  MammaPrint testing to determine if she would benefit from chemotherapy. 2. adjuvant radiation 3.  Follow-up adjuvant antiestrogen therapy with ovarian function suppression and anastrozole (she was started on tamoxifen preoperatively): After discussing risks and benefits, she may consider to stay on tamoxifen for adjuvant therapy. 4.  Abemaciclib adjuvant treatment: We will consider this if she is high risk on MammaPrint  Return to clinic based upon MammaPrint test result      No orders of the defined types were placed in this encounter.  The patient has a good understanding of the overall plan. she agrees with it. she will call with any problems that may develop before the next visit here.  Total time spent: 30 mins including face to face time and time spent for planning, charting and coordination of care  Rulon Eisenmenger, MD, MPH  02/02/2021  I, Molly Dorshimer, am acting as scribe for Dr. Nicholas Lose.  I have reviewed the above documentation for accuracy and completeness, and I agree with the above.

## 2021-02-02 NOTE — Assessment & Plan Note (Addendum)
01/23/2021: Right mastectomy: Grade 2 IDC, 6.3 cm, intermediate grade DCIS, margins negative, carcinoma involves the dermis of the nipple, 1 intramammary lymph node positive, 2 additional lymph nodes in the right axilla positive out of 4 ER 100%, PR 100%, HER2 negative, Ki-67 20% T3N1A stage Ib  Pathology counseling: I discussed the final pathology report of the patient provided  a copy of this report. I discussed the margins as well as lymph node surgeries. We also discussed the final staging along with previously performed ER/PR and HER-2/neu testing.  Recommendation: 1.  MammaPrint testing to determine if she would benefit from chemotherapy. 2. adjuvant radiation 3.  Follow-up adjuvant antiestrogen therapy with ovarian function suppression and anastrozole (she was started on tamoxifen preoperatively) 4.  Abemaciclib adjuvant treatment  Return to clinic based upon MammaPrint test result

## 2021-02-02 NOTE — Telephone Encounter (Signed)
Ordered mammaprint per Dr. Lindi Adie. Requisition faxed to [facility] and pathology.

## 2021-02-09 ENCOUNTER — Encounter (HOSPITAL_COMMUNITY): Payer: Self-pay

## 2021-02-10 ENCOUNTER — Telehealth: Payer: Self-pay | Admitting: *Deleted

## 2021-02-10 DIAGNOSIS — Z170 Estrogen receptor positive status [ER+]: Secondary | ICD-10-CM

## 2021-02-10 DIAGNOSIS — C50411 Malignant neoplasm of upper-outer quadrant of right female breast: Secondary | ICD-10-CM

## 2021-02-10 NOTE — Telephone Encounter (Signed)
Received Mammaprint results of LOW RISK. Physician team notified. Called pt with results. Discussed chemo not recommended with next step of xrt. Received verbal understanding. Referral placed for Dr. Sondra Come.

## 2021-02-12 LAB — HM PAP SMEAR: HPV, high-risk: NEGATIVE

## 2021-02-13 NOTE — Progress Notes (Signed)
Location of Breast Cancer:right breast  Histology per Pathology Report:  12/26/2020   01/23/2021   Receptor Status:    Did patient present with symptoms (if so, please note symptoms) or was this found on screening mammography?:    Past/Anticipated interventions by surgeon, if any: 01/23/2021   Past/Anticipated interventions by medical oncology, if any: Dr Lindi Adie   Lymphedema issues, if any:  no    Pain issues, if any:  yes, right lateral breast and axilla intermittent, sharp and tingling  SAFETY ISSUES:  Prior radiation? no, has had radioactive iodine  Pacemaker/ICD? no  Possible current pregnancy?no  Is the patient on methotrexate? no  Current Complaints / other details:  None    Vitals:   02/18/21 1453  BP: 129/63  Pulse: 72  Resp: 18  Temp: (!) 97.1 F (36.2 C)  TempSrc: Temporal  SpO2: 98%  Weight: 218 lb 4 oz (99 kg)  Height: 5\' 8"  (1.727 m)

## 2021-02-16 ENCOUNTER — Encounter: Payer: Self-pay | Admitting: *Deleted

## 2021-02-18 ENCOUNTER — Other Ambulatory Visit: Payer: Self-pay

## 2021-02-18 ENCOUNTER — Ambulatory Visit
Admission: RE | Admit: 2021-02-18 | Discharge: 2021-02-18 | Disposition: A | Discharge status: Home / Self Care | Payer: Medicaid Other | Source: Ambulatory Visit | Attending: Radiation Oncology | Admitting: Radiation Oncology

## 2021-02-18 ENCOUNTER — Encounter: Payer: Self-pay | Admitting: Radiation Oncology

## 2021-02-18 ENCOUNTER — Encounter: Payer: Self-pay | Admitting: Hematology and Oncology

## 2021-02-18 ENCOUNTER — Other Ambulatory Visit: Payer: Self-pay | Admitting: Hematology and Oncology

## 2021-02-18 VITALS — BP 129/63 | HR 72 | Temp 97.1°F | Resp 18 | Ht 68.0 in | Wt 218.2 lb

## 2021-02-18 DIAGNOSIS — C50411 Malignant neoplasm of upper-outer quadrant of right female breast: Secondary | ICD-10-CM | POA: Diagnosis present

## 2021-02-18 DIAGNOSIS — Z9011 Acquired absence of right breast and nipple: Secondary | ICD-10-CM | POA: Diagnosis not present

## 2021-02-18 DIAGNOSIS — C773 Secondary and unspecified malignant neoplasm of axilla and upper limb lymph nodes: Secondary | ICD-10-CM | POA: Diagnosis not present

## 2021-02-18 DIAGNOSIS — E039 Hypothyroidism, unspecified: Secondary | ICD-10-CM | POA: Diagnosis not present

## 2021-02-18 DIAGNOSIS — Z8551 Personal history of malignant neoplasm of bladder: Secondary | ICD-10-CM | POA: Diagnosis not present

## 2021-02-18 DIAGNOSIS — Z793 Long term (current) use of hormonal contraceptives: Secondary | ICD-10-CM | POA: Insufficient documentation

## 2021-02-18 DIAGNOSIS — Z170 Estrogen receptor positive status [ER+]: Secondary | ICD-10-CM | POA: Insufficient documentation

## 2021-02-18 NOTE — Progress Notes (Signed)
See MD note for nursing evaluation. °

## 2021-02-18 NOTE — Progress Notes (Signed)
Radiation Oncology         [phone] ________________________________  Initial Outpatient Consultation  Name: Phyllis Harris MRN: [id number]  Date: 02/18/2021  DOB: March 31, 1981  CC:Phyllis Harris, Phyllis Harris  Phyllis Harris, Phyllis Harris   REFERRING PHYSICIAN: Nicholas Harris, Phyllis Harris  DIAGNOSIS: The encounter diagnosis was Malignant neoplasm of upper-outer quadrant of right breast in female, estrogen receptor positive ([place]).  Stage IB (PT3, PN1a) Right Breast UQs, IDC with DCIS, ER+ / PR+ / Her2(-), Grade 2  HISTORY OF PRESENT ILLNESS::Phyllis Harris is a 40 y.o. female who is accompanied by her mother today. She presented with a palpable lump in the right breast She underwent bilateral diagnostic mammography and right breast ultrasonography on 12/25/20 showing: a 9.7cm distribution of calcification involving both UQ of the right breast. There were multiple scattered heterogenious shadowing areas concerning for invasive disease throughout the UO and UI right breast within the region of calcification. There are 2 suspicious lymph nodes in the right axilla which are adjacent to each other. There was no evidence of malignancy in the left breast.    Biopsy on 12/26/20 showed: invasive ductal carcinoma grade 2; DCIS. This was present in both samples, from 11 o'clock and 2 o'clock. The biopsied lymph node also showed IDC and DCIS, with no nodal tissue present. Prognostic indicators significant for: estrogen receptor, 100% positive and progesterone receptor, 100% positive. Proliferation marker Ki67 at 20%. HER2 negative.  She underwent genetic testing on 01/01/21. Results were negative.  She opted to proceed with right mastectomy (skin sparing) with targeted (lower level) axillary node dissection on 01/23/21. Pathology from the procedure revealed: Invasive ductal carcinoma, 6.3 cm, grade 2,  DCIS intermediate grade. Resection margins are negative. Carcinoma involves dermis of the nipple, metastatic carcinoma to one  intamammary lymph node (1/1) without evidence of extranodal extension. Out of 4 sentinel lymph nodes biopsied, 2 were positive for metastatic carcinoma, one of which showed focal extranodal extension. Tissue expander placed.  Mammaprint was performed on the final surgical sample and showed a low risk for distant recurrence, therefore adjuvant chemotherapy not recommended.  PREVIOUS RADIATION THERAPY: No  PAST MEDICAL HISTORY:  Past Medical History:  Diagnosis Date  . Anxiety   . Cancer [facility]) 2008   thyroid   . Family history of thyroid cancer 01/02/2021  . GERD (gastroesophageal reflux disease)    with pregnancy  . History of chicken pox   . History of thyroid cancer 01/02/2021  . Hypothyroidism   . PONV (postoperative nausea and vomiting)     PAST SURGICAL HISTORY: Past Surgical History:  Procedure Laterality Date  . BREAST RECONSTRUCTION WITH PLACEMENT OF TISSUE EXPANDER AND ALLODERM Right 01/23/2021   Procedure: BREAST RECONSTRUCTION WITH PLACEMENT OF TISSUE EXPANDER AND ALLODERM;  Surgeon: Irene Limbo, Phyllis Harris;  Location: [place];  Service: Plastics;  Laterality: Right;  . CESAREAN SECTION  2010  . CESAREAN SECTION N/A 05/14/2014   Procedure: CESAREAN SECTION;  Surgeon: Cyril Mourning, Phyllis Harris;  Location: [place] [facility];  Service: Obstetrics;  Laterality: N/A;  . DILATION AND CURETTAGE OF UTERUS     miscarriage  . MASTECTOMY W/ SENTINEL NODE BIOPSY Right 01/23/2021   Procedure: RIGHT MASTECTOMY WITH AXILLARY SENTINEL LYMPH NODE BIOPSY;  Surgeon: Rolm Bookbinder, Phyllis Harris;  Location: [place];  Service: General;  Laterality: Right;  . RADIOACTIVE SEED GUIDED AXILLARY SENTINEL LYMPH NODE Right 01/23/2021   Procedure: RADIOACTIVE SEED GUIDED RIGHT AXILLARY SENTINEL LYMPH NODE EXCISION;  Surgeon: Rolm Bookbinder, Phyllis Harris;  Location: [place];  Service: General;  Laterality: Right;  . THYROIDECTOMY      FAMILY HISTORY:  Family History  Problem Relation Age of Onset  . Thyroid cancer Mother         anaplastic; dx late 46s  . Diabetes Mother   . Hyperlipidemia Mother   . Kidney disease Mother   . COPD Father   . Hearing loss Father   . Heart attack Maternal Grandfather   . Heart attack Paternal Grandfather   . Non-Hodgkin's lymphoma Maternal Grandmother        d.89  . Breast cancer Neg Hx   . Colon cancer Neg Hx     SOCIAL HISTORY:  Social History   Tobacco Use  . Smoking status: Never Smoker  . Smokeless tobacco: Never Used  Vaping Use  . Vaping Use: Never used  Substance Use Topics  . Alcohol use: Yes    Comment: occassional  . Drug use: No    ALLERGIES: No Known Allergies  MEDICATIONS:  Current Outpatient Medications  Medication Sig Dispense Refill  . acetaminophen (TYLENOL) 500 MG tablet Take by mouth.    . levonorgestrel (MIRENA) 20 MCG/24HR IUD 1 each by Intrauterine route once. Inserted by GYN Dr. Gaetano Net 11/04/2017, needs to be removed in 10/2022.    . levothyroxine (SYNTHROID, LEVOTHROID) 175 MCG tablet Take 175 mcg by mouth daily before breakfast.    . methocarbamol (ROBAXIN) 500 MG tablet methocarbamol 500 mg tablet  TAKE 1 TABLET BY MOUTH EVERY 8 HOURS AS NEEDED.    . Multiple Vitamin (MULTIVITAMIN WITH MINERALS) TABS tablet Take 1 tablet by mouth in the morning.    . sertraline (ZOLOFT) 50 MG tablet Take 50 mg by mouth in the morning.    . tamoxifen (NOLVADEX) 20 MG tablet TAKE 1 TABLET BY MOUTH EVERY DAY (Patient not taking: Reported on 02/18/2021) 30 tablet 1   No current facility-administered medications for this encounter.    REVIEW OF SYSTEMS:  A 10+ POINT REVIEW OF SYSTEMS WAS OBTAINED including neurology, dermatology, psychiatry, cardiac, respiratory, lymph, extremities, GI, GU, musculoskeletal, constitutional, reproductive, HEENT. Soreness in the reconstructed right breast.   PHYSICAL EXAM:  height is '5\' 8"'  (1.727 m) and weight is 218 lb 4 oz (99 kg). Her temporal temperature is 97.1 F (36.2 C) (abnormal). Her blood pressure is 129/63 and her  pulse is 72. Her respiration is 18 and oxygen saturation is 98%.   General: Alert and oriented, in no acute distress HEENT: Head is normocephalic. Extraocular movements are intact.  Neck: Neck is supple, no palpable cervical or supraclavicular lymphadenopathy. Heart: Regular in rate and rhythm with no murmurs, rubs, or gallops. Chest: Clear to auscultation bilaterally, with no rhonchi, wheezes, or rales. Abdomen: Soft, nontender, nondistended, with no rigidity or guarding. Extremities: No cyanosis or edema. Lymphatics: see Neck Exam Skin: No concerning lesions. Musculoskeletal: symmetric strength and muscle tone throughout. Neurologic: Cranial nerves II through XII are grossly intact. No obvious focalities. Speech is fluent. Coordination is intact. Psychiatric: Judgment and insight are intact. Affect is appropriate. Breasts: Left breast no palpable mass, nipple discharge, or bleeding. Right chest wall shows an inverted "T" scar shows is healing well with cosmetic expander in place.  ECOG = 1  0 - Asymptomatic (Fully active, able to carry on all predisease activities without restriction)  1 - Symptomatic but completely ambulatory (Restricted in physically strenuous activity but ambulatory and able to carry out work of a light or sedentary nature. For example, light housework, office work)  2 - Symptomatic, <  50% in bed during the day (Ambulatory and capable of all self care but unable to carry out any work activities. Up and about more than 50% of waking hours)  3 - Symptomatic, >50% in bed, but not bedbound (Capable of only limited self-care, confined to bed or chair 50% or more of waking hours)  4 - Bedbound (Completely disabled. Cannot carry on any self-care. Totally confined to bed or chair)  5 - Death   Eustace Pen MM, Creech RH, Tormey DC, et al. [phone]). "Toxicity and response criteria of the [facility] Group". [place] Oncol. 5 (6): 649-55  LABORATORY DATA:  Lab  Results  Component Value Date   WBC 6.3 01/21/2021   HGB 13.6 01/21/2021   HCT 41.3 01/21/2021   MCV 92.0 01/21/2021   PLT 286 01/21/2021   NEUTROABS 7.0 01/01/2021   Lab Results  Component Value Date   NA 141 01/21/2021   K 4.3 01/21/2021   CL 107 01/21/2021   CO2 26 01/21/2021   GLUCOSE 112 (H) 01/21/2021   CREATININE 0.75 01/21/2021   CALCIUM 8.2 (L) 01/21/2021      RADIOGRAPHY: NM Sentinel Node Inj-No Rpt (Breast)  Result Date: 01/23/2021 Sulfur colloid was injected by the nuclear medicine technologist for melanoma sentinel node.   MM Breast Surgical Specimen  Result Date: 01/23/2021 CLINICAL DATA:  Evaluate surgical specimen following targeted excision of RIGHT axillary lymph node metastasis EXAM: SPECIMEN RADIOGRAPH OF THE RIGHT AXILLA COMPARISON:  Previous exam(s). FINDINGS: Status post excision of the RIGHT axilla. The radioactive seed and biopsy marker clip are present, completely intact, and were marked for pathology. IMPRESSION: Specimen radiograph of the RIGHT axilla Electronically Signed   By: Margarette Canada M.D.   On: 01/23/2021 08:44   [place] RT RADIOACTIVE SEED LOC  Result Date: 01/22/2021 CLINICAL DATA:  Ultrasound-guided placement of a radioactive seed in a previously biopsied lymph node. EXAM: ULTRASOUND GUIDED RADIOACTIVE SEED LOCALIZATION OF THE RIGHT BREAST COMPARISON:  Previous exam(s). FINDINGS: Patient presents for radioactive seed localization prior to surgery. I met with the patient and we discussed the procedure of seed localization including benefits and alternatives. We discussed the high likelihood of a successful procedure. We discussed the risks of the procedure including infection, bleeding, tissue injury and further surgery. We discussed the low dose of radioactivity involved in the procedure. Informed, written consent was given. The usual time-out protocol was performed immediately prior to the procedure. Using ultrasound guidance, sterile technique, 1%  lidocaine and an I-125 radioactive seed, the previously biopsied lymph node was localized using a lateral approach. The follow-up mammogram images confirm the seed in the expected location and were marked for Dr. Donne Hazel. Follow-up survey of the patient confirms presence of the radioactive seed. Order number of I-125 seed:  [id number]. Total activity:  6.834 millicuries reference Date: January 12, 2021 The patient tolerated the procedure well and was released from the [place]. She was given instructions regarding seed removal. IMPRESSION: Radioactive seed localization right axilla. No apparent complications. Electronically Signed   By: Dorise Bullion III M.D   On: 01/22/2021 14:24   MM CLIP PLACEMENT RIGHT  Result Date: 01/22/2021 CLINICAL DATA:  Evaluate placement of radioactive seed. EXAM: DIAGNOSTIC RIGHT MAMMOGRAM POST ULTRASOUND GUIDED RADIOACTIVE SEED PLACEMENT COMPARISON:  Previous exam(s). FINDINGS: Mammographic images were obtained following ultrasound guided placement of a radioactive seed. The radioactive seed is within the previously biopsied lymph node, adjacent to the previously placed left biopsy marker. IMPRESSION: Appropriate positioning of the  radioactive seed within the previously biopsied lymph node. Final Assessment: Post Procedure Mammograms for Marker Placement Electronically Signed   By: Dorise Bullion III M.D   On: 01/22/2021 14:21      IMPRESSION: Stage IB (PT3, PN1A) Right Breast UQs, IDC with DCIS, ER+ / PR+ / Her2(-), Grade 2  Given pathologic findings the patient would be at risk for local/regional recurrence and would recommend post-mastectomy radiation therapy to reduce this risk.  Today, I talked to the patient and mother about the findings and work-up thus far.  We discussed the natural history of breast cancer and general treatment, highlighting the role of radiotherapy in the management.  We discussed the available radiation techniques, and focused on the details  of logistics and delivery.  We reviewed the anticipated acute and late sequelae associated with radiation in this setting.  The patient was encouraged to ask questions that I answered to the best of my ability.  A patient consent form was discussed and signed.  We retained a copy for our records.  The patient would like to proceed with radiation and will be scheduled for CT simulation.  PLAN: Ct simulation scheduled for next week with treatments to begin ~ 6 weeks post-op. Anticipate 5-6 weeks of radiation therapy.     ------------------------------------------------  Blair Promise, PhD, Phyllis Harris  This document serves as a record of services personally performed by Gery Pray, Phyllis Harris. It was created on his behalf by Roney Mans, a trained medical scribe. The creation of this record is based on the scribe's personal observations and the provider's statements to them. This document has been checked and approved by the attending provider.

## 2021-02-19 ENCOUNTER — Other Ambulatory Visit: Payer: Self-pay | Admitting: *Deleted

## 2021-02-19 ENCOUNTER — Encounter: Payer: Self-pay | Admitting: Hematology and Oncology

## 2021-02-19 NOTE — Progress Notes (Signed)
Per MD tamoxifen needing to be removed from pt chart.  Plan for pt to receive AI and Zoladex after completion of radiation therapy.  Pt notified and verbalized understanding.

## 2021-02-20 ENCOUNTER — Ambulatory Visit: Payer: No Typology Code available for payment source

## 2021-02-24 ENCOUNTER — Encounter: Payer: Self-pay | Admitting: *Deleted

## 2021-02-25 ENCOUNTER — Ambulatory Visit
Admission: RE | Admit: 2021-02-25 | Discharge: 2021-02-25 | Disposition: A | Discharge status: Home / Self Care | Payer: Medicaid Other | Source: Ambulatory Visit | Attending: Radiation Oncology | Admitting: Radiation Oncology

## 2021-02-25 ENCOUNTER — Other Ambulatory Visit: Payer: Self-pay

## 2021-02-25 ENCOUNTER — Ambulatory Visit: Payer: Medicaid Other | Admitting: Rehabilitation

## 2021-02-25 ENCOUNTER — Encounter: Payer: Self-pay | Admitting: Rehabilitation

## 2021-02-25 DIAGNOSIS — R293 Abnormal posture: Secondary | ICD-10-CM | POA: Insufficient documentation

## 2021-02-25 DIAGNOSIS — Z483 Aftercare following surgery for neoplasm: Secondary | ICD-10-CM | POA: Insufficient documentation

## 2021-02-25 DIAGNOSIS — Z510 Encounter for antineoplastic radiation therapy: Secondary | ICD-10-CM | POA: Diagnosis present

## 2021-02-25 DIAGNOSIS — Z170 Estrogen receptor positive status [ER+]: Secondary | ICD-10-CM | POA: Insufficient documentation

## 2021-02-25 DIAGNOSIS — C50411 Malignant neoplasm of upper-outer quadrant of right female breast: Secondary | ICD-10-CM | POA: Insufficient documentation

## 2021-02-25 NOTE — Therapy (Signed)
[place], [place], [address] Phone: [phone]   Fax:  [phone]  Physical Therapy Treatment  Patient Details  Name: Phyllis Harris MRN: [id number] Date of Birth: 12-27-1980 Referring Provider (PT): Dr. Donne Hazel   Encounter Date: 02/25/2021   PT End of Session - 02/25/21 0938    Visit Number 2    Number of Visits 2    Date for PT Re-Evaluation 03/06/21    PT Start Time 0800    PT Stop Time 0834    PT Time Calculation (min) 34 min    Activity Tolerance Patient tolerated treatment well    Behavior During Therapy [facility] for tasks assessed/performed           Past Medical History:  Diagnosis Date  . Anxiety   . Cancer [facility]) 2008   thyroid   . Family history of thyroid cancer 01/02/2021  . GERD (gastroesophageal reflux disease)    with pregnancy  . History of chicken pox   . History of thyroid cancer 01/02/2021  . Hypothyroidism   . PONV (postoperative nausea and vomiting)     Past Surgical History:  Procedure Laterality Date  . BREAST RECONSTRUCTION WITH PLACEMENT OF TISSUE EXPANDER AND ALLODERM Right 01/23/2021   Procedure: BREAST RECONSTRUCTION WITH PLACEMENT OF TISSUE EXPANDER AND ALLODERM;  Surgeon: Irene Limbo, MD;  Location: [place];  Service: Plastics;  Laterality: Right;  . CESAREAN SECTION  2010  . CESAREAN SECTION N/A 05/14/2014   Procedure: CESAREAN SECTION;  Surgeon: Cyril Mourning, MD;  Location: [place] [facility];  Service: Obstetrics;  Laterality: N/A;  . DILATION AND CURETTAGE OF UTERUS     miscarriage  . MASTECTOMY W/ SENTINEL NODE BIOPSY Right 01/23/2021   Procedure: RIGHT MASTECTOMY WITH AXILLARY SENTINEL LYMPH NODE BIOPSY;  Surgeon: Rolm Bookbinder, MD;  Location: [place];  Service: General;  Laterality: Right;  . RADIOACTIVE SEED GUIDED AXILLARY SENTINEL LYMPH NODE Right 01/23/2021   Procedure: RADIOACTIVE SEED GUIDED RIGHT AXILLARY SENTINEL LYMPH NODE EXCISION;  Surgeon:  Rolm Bookbinder, MD;  Location: [place];  Service: General;  Laterality: Right;  . THYROIDECTOMY      There were no vitals filed for this visit.   Subjective Assessment - 02/25/21 0858    Subjective Pt returns 4.5 weeks post op.  The typical tight expander pain.  I am having some pain around where the drain was. Filling is done.  I see Dr. Sondra Come today for the scan and the simulation.    Pertinent History Rt mastectomy with expander 01/23/21 with Dr Donne Hazel and [place] with 2/4 nodes positive.  No chemotherapy needed, but will do doing radiation.    Limitations --   no daily limitations   Currently in Pain? No/denies   only with the intermittent pain             [facility] PT Assessment - 02/25/21 0001      Assessment   Medical Diagnosis Right breast Cancer    Referring Provider (PT) Dr. Donne Hazel    Onset Date/Surgical Date 01/23/21    Hand Dominance Right      Observation/Other Assessments   Observations healing inverted Y incision in the Rt breast iwth some scabbing still present inferiorly but still okay to start radiation    Skin Integrity no cording noted      Sensation   Additional Comments some tricep region numbness      Coordination   Gross Motor Movements are Fluid and Coordinated Yes  AROM   Right Shoulder Flexion 171 Degrees    Right Shoulder ABduction 160 Degrees    Right Shoulder External Rotation 115 Degrees    Right Shoulder Horizontal ABduction 40 Degrees   no cording                Quick Dash - 02/25/21 0001    Open a tight or new jar No difficulty    Do heavy household chores (wash walls, wash floors) Mild difficulty    Carry a shopping bag or briefcase No difficulty    Wash your back No difficulty    Use a knife to cut food No difficulty    Recreational activities in which you take some force or impact through your arm, shoulder, or hand (golf, hammering, tennis) Severe difficulty    During the past week, to what extent has your arm,  shoulder or hand problem interfered with your normal social activities with family, friends, neighbors, or groups? Modererately    During the past week, to what extent has your arm, shoulder or hand problem limited your work or other regular daily activities Quite a bit    Arm, shoulder, or hand pain. Mild    Tingling (pins and needles) in your arm, shoulder, or hand Moderate    Difficulty Sleeping Mild difficulty    DASH Score 29.55 %                  [facility] Adult PT Treatment/Exercise - 02/25/21 0001      Self-Care   Self-Care Other Self-Care Comments    Other Self-Care Comments  reviewed ABC class pricincples and risk reduction, gave pt information on where to get bras and/or a sleeve, discussed walking and active lifestyle as well as radiation long term side effects and the importance of continued stretching                  PT Education - 02/25/21 0937    Education Details risk reduction, turning point 4 week stretches, walking, use of sleeve, and radiation side effects    Person(s) Educated Patient    Methods Explanation;Handout    Comprehension Verbalized understanding               PT Long Term Goals - 02/25/21 0940      PT LONG TERM GOAL #1   Title Pt will have right shoulder ROM and function nearly equal to left    Status Achieved                 Plan - 02/25/21 0938    Clinical Impression Statement Pt is doing very well 4.5 weeks post Rt mastectomy and expander with almost full return to AROM pre-operatively.  Pt reports no limitations and only intermittend pain near the old drain pathway.  Pt is ready for radiation simulation today and is now ready for HEP and self care with PT visits only if needed in the future.  Emphasized the importance of walking and maintaing stretching and posture after radiation.  Pt is set up for [place] in June.    PT Home Exercise Plan 4 post op exercises as allowed by MD, turning point 4 week and on stretches     Recommended Other Services has script for sleeve and bras    Consulted and Agree with Plan of Care Patient           Patient will benefit from skilled therapeutic intervention in order to improve the following deficits and  impairments:     Visit Diagnosis: Abnormal posture  Malignant neoplasm of upper-outer quadrant of right breast in female, estrogen receptor positive [facility])  Aftercare following surgery for neoplasm     Problem List Patient Active Problem List   Diagnosis Date Noted  . S/P mastectomy, right 01/23/2021  . Genetic testing 01/09/2021  . Family history of thyroid cancer 01/02/2021  . History of thyroid cancer 01/02/2021  . Malignant neoplasm of upper-outer quadrant of right female breast ([place]) 12/30/2020  . S/P cesarean section 05/14/2014    Stark Bray 02/25/2021, 9:41 AM  [place], [place], [address] Phone: [phone]   Fax:  [phone]  Name: EVERLENE CUNNING MRN: [id number] Date of Birth: 1981-07-01  PHYSICAL THERAPY DISCHARGE SUMMARY  Visits from Start of Care: 2  Current functional level related to goals / functional outcomes: See above   Remaining deficits: See above   Education / Equipment: Will continue [facility]ZO screening Plan: Patient agrees to discharge.  Patient goals were partially met. Patient is being discharged due to meeting the stated rehab goals.  ?????

## 2021-03-03 ENCOUNTER — Telehealth: Payer: Self-pay | Admitting: Hematology and Oncology

## 2021-03-03 ENCOUNTER — Encounter: Payer: Self-pay | Admitting: *Deleted

## 2021-03-03 DIAGNOSIS — Z510 Encounter for antineoplastic radiation therapy: Secondary | ICD-10-CM | POA: Diagnosis not present

## 2021-03-03 NOTE — Telephone Encounter (Signed)
Scheduled per 5/4 sch msg. Called pt and left a msg.

## 2021-03-04 ENCOUNTER — Other Ambulatory Visit: Payer: Self-pay

## 2021-03-04 ENCOUNTER — Ambulatory Visit
Admission: RE | Admit: 2021-03-04 | Discharge: 2021-03-04 | Disposition: A | Discharge status: Home / Self Care | Payer: Medicaid Other | Source: Ambulatory Visit | Attending: Radiation Oncology | Admitting: Radiation Oncology

## 2021-03-04 DIAGNOSIS — C50411 Malignant neoplasm of upper-outer quadrant of right female breast: Secondary | ICD-10-CM

## 2021-03-04 DIAGNOSIS — Z510 Encounter for antineoplastic radiation therapy: Secondary | ICD-10-CM | POA: Diagnosis not present

## 2021-03-05 ENCOUNTER — Ambulatory Visit
Admission: RE | Admit: 2021-03-05 | Discharge: 2021-03-05 | Disposition: A | Discharge status: Home / Self Care | Payer: Medicaid Other | Source: Ambulatory Visit | Attending: Radiation Oncology | Admitting: Radiation Oncology

## 2021-03-05 DIAGNOSIS — Z510 Encounter for antineoplastic radiation therapy: Secondary | ICD-10-CM | POA: Diagnosis not present

## 2021-03-06 ENCOUNTER — Ambulatory Visit
Admission: RE | Admit: 2021-03-06 | Discharge: 2021-03-06 | Disposition: A | Discharge status: Home / Self Care | Payer: Medicaid Other | Source: Ambulatory Visit | Attending: Radiation Oncology | Admitting: Radiation Oncology

## 2021-03-06 ENCOUNTER — Other Ambulatory Visit: Payer: Self-pay

## 2021-03-06 DIAGNOSIS — Z510 Encounter for antineoplastic radiation therapy: Secondary | ICD-10-CM | POA: Diagnosis not present

## 2021-03-10 ENCOUNTER — Other Ambulatory Visit: Payer: Self-pay

## 2021-03-10 ENCOUNTER — Ambulatory Visit
Admission: RE | Admit: 2021-03-10 | Discharge: 2021-03-10 | Disposition: A | Discharge status: Home / Self Care | Payer: Medicaid Other | Source: Ambulatory Visit | Attending: Radiation Oncology | Admitting: Radiation Oncology

## 2021-03-10 DIAGNOSIS — Z510 Encounter for antineoplastic radiation therapy: Secondary | ICD-10-CM | POA: Diagnosis not present

## 2021-03-10 DIAGNOSIS — C50411 Malignant neoplasm of upper-outer quadrant of right female breast: Secondary | ICD-10-CM

## 2021-03-10 MED ORDER — SONAFINE EX EMUL
1.0000 | Freq: Once | CUTANEOUS | Status: AC
Start: 2021-03-10 — End: 2021-03-10
  Administered 2021-03-10: 1 via TOPICAL

## 2021-03-11 ENCOUNTER — Ambulatory Visit
Admission: RE | Admit: 2021-03-11 | Discharge: 2021-03-11 | Disposition: A | Discharge status: Home / Self Care | Payer: Medicaid Other | Source: Ambulatory Visit | Attending: Radiation Oncology | Admitting: Radiation Oncology

## 2021-03-11 DIAGNOSIS — Z808 Family history of malignant neoplasm of other organs or systems: Secondary | ICD-10-CM | POA: Insufficient documentation

## 2021-03-11 DIAGNOSIS — C50411 Malignant neoplasm of upper-outer quadrant of right female breast: Secondary | ICD-10-CM | POA: Insufficient documentation

## 2021-03-12 ENCOUNTER — Ambulatory Visit
Admission: RE | Admit: 2021-03-12 | Discharge: 2021-03-12 | Disposition: A | Discharge status: Home / Self Care | Payer: Medicaid Other | Source: Ambulatory Visit | Attending: Radiation Oncology | Admitting: Radiation Oncology

## 2021-03-12 ENCOUNTER — Other Ambulatory Visit: Payer: Self-pay

## 2021-03-12 DIAGNOSIS — C50411 Malignant neoplasm of upper-outer quadrant of right female breast: Secondary | ICD-10-CM | POA: Diagnosis not present

## 2021-03-13 ENCOUNTER — Ambulatory Visit
Admission: RE | Admit: 2021-03-13 | Discharge: 2021-03-13 | Disposition: A | Discharge status: Home / Self Care | Payer: Medicaid Other | Source: Ambulatory Visit | Attending: Radiation Oncology | Admitting: Radiation Oncology

## 2021-03-13 DIAGNOSIS — C50411 Malignant neoplasm of upper-outer quadrant of right female breast: Secondary | ICD-10-CM | POA: Diagnosis not present

## 2021-03-16 ENCOUNTER — Ambulatory Visit: Payer: Medicaid Other

## 2021-03-17 ENCOUNTER — Ambulatory Visit
Admission: RE | Admit: 2021-03-17 | Discharge: 2021-03-17 | Disposition: A | Discharge status: Home / Self Care | Payer: Medicaid Other | Source: Ambulatory Visit | Attending: Radiation Oncology | Admitting: Radiation Oncology

## 2021-03-17 ENCOUNTER — Other Ambulatory Visit: Payer: Self-pay

## 2021-03-17 DIAGNOSIS — C50411 Malignant neoplasm of upper-outer quadrant of right female breast: Secondary | ICD-10-CM | POA: Diagnosis not present

## 2021-03-18 ENCOUNTER — Ambulatory Visit
Admission: RE | Admit: 2021-03-18 | Discharge: 2021-03-18 | Disposition: A | Discharge status: Home / Self Care | Payer: Medicaid Other | Source: Ambulatory Visit | Attending: Radiation Oncology | Admitting: Radiation Oncology

## 2021-03-18 DIAGNOSIS — C50411 Malignant neoplasm of upper-outer quadrant of right female breast: Secondary | ICD-10-CM | POA: Diagnosis not present

## 2021-03-19 ENCOUNTER — Ambulatory Visit
Admission: RE | Admit: 2021-03-19 | Discharge: 2021-03-19 | Disposition: A | Discharge status: Home / Self Care | Payer: Medicaid Other | Source: Ambulatory Visit | Attending: Radiation Oncology | Admitting: Radiation Oncology

## 2021-03-19 DIAGNOSIS — C50411 Malignant neoplasm of upper-outer quadrant of right female breast: Secondary | ICD-10-CM | POA: Diagnosis not present

## 2021-03-20 ENCOUNTER — Other Ambulatory Visit: Payer: Self-pay

## 2021-03-20 ENCOUNTER — Ambulatory Visit
Admission: RE | Admit: 2021-03-20 | Discharge: 2021-03-20 | Disposition: A | Discharge status: Home / Self Care | Payer: Medicaid Other | Source: Ambulatory Visit | Attending: Radiation Oncology | Admitting: Radiation Oncology

## 2021-03-20 DIAGNOSIS — C50411 Malignant neoplasm of upper-outer quadrant of right female breast: Secondary | ICD-10-CM | POA: Diagnosis not present

## 2021-03-21 ENCOUNTER — Other Ambulatory Visit: Payer: Self-pay | Admitting: Hematology and Oncology

## 2021-03-23 ENCOUNTER — Ambulatory Visit
Admission: RE | Admit: 2021-03-23 | Discharge: 2021-03-23 | Disposition: A | Discharge status: Home / Self Care | Payer: Medicaid Other | Source: Ambulatory Visit | Attending: Radiation Oncology | Admitting: Radiation Oncology

## 2021-03-23 ENCOUNTER — Encounter: Payer: Self-pay | Admitting: *Deleted

## 2021-03-23 ENCOUNTER — Other Ambulatory Visit: Payer: Self-pay

## 2021-03-23 DIAGNOSIS — C50411 Malignant neoplasm of upper-outer quadrant of right female breast: Secondary | ICD-10-CM | POA: Diagnosis not present

## 2021-03-24 ENCOUNTER — Ambulatory Visit
Admission: RE | Admit: 2021-03-24 | Discharge: 2021-03-24 | Disposition: A | Discharge status: Home / Self Care | Payer: Medicaid Other | Source: Ambulatory Visit | Attending: Radiation Oncology | Admitting: Radiation Oncology

## 2021-03-24 DIAGNOSIS — C50411 Malignant neoplasm of upper-outer quadrant of right female breast: Secondary | ICD-10-CM | POA: Diagnosis not present

## 2021-03-25 ENCOUNTER — Other Ambulatory Visit: Payer: Self-pay

## 2021-03-25 ENCOUNTER — Ambulatory Visit
Admission: RE | Admit: 2021-03-25 | Discharge: 2021-03-25 | Disposition: A | Discharge status: Home / Self Care | Payer: Medicaid Other | Source: Ambulatory Visit | Attending: Radiation Oncology | Admitting: Radiation Oncology

## 2021-03-25 DIAGNOSIS — C50411 Malignant neoplasm of upper-outer quadrant of right female breast: Secondary | ICD-10-CM | POA: Diagnosis not present

## 2021-03-26 ENCOUNTER — Ambulatory Visit
Admission: RE | Admit: 2021-03-26 | Discharge: 2021-03-26 | Disposition: A | Discharge status: Home / Self Care | Payer: Medicaid Other | Source: Ambulatory Visit | Attending: Radiation Oncology | Admitting: Radiation Oncology

## 2021-03-26 ENCOUNTER — Ambulatory Visit: Payer: Medicaid Other | Admitting: Radiation Oncology

## 2021-03-26 DIAGNOSIS — C50411 Malignant neoplasm of upper-outer quadrant of right female breast: Secondary | ICD-10-CM | POA: Diagnosis not present

## 2021-03-27 ENCOUNTER — Ambulatory Visit
Admission: RE | Admit: 2021-03-27 | Discharge: 2021-03-27 | Disposition: A | Discharge status: Home / Self Care | Payer: Medicaid Other | Source: Ambulatory Visit | Attending: Radiation Oncology | Admitting: Radiation Oncology

## 2021-03-27 ENCOUNTER — Other Ambulatory Visit: Payer: Self-pay

## 2021-03-27 DIAGNOSIS — C50411 Malignant neoplasm of upper-outer quadrant of right female breast: Secondary | ICD-10-CM | POA: Diagnosis not present

## 2021-03-30 ENCOUNTER — Other Ambulatory Visit: Payer: Self-pay

## 2021-03-30 ENCOUNTER — Ambulatory Visit
Admission: RE | Admit: 2021-03-30 | Discharge: 2021-03-30 | Disposition: A | Discharge status: Home / Self Care | Payer: Medicaid Other | Source: Ambulatory Visit | Attending: Radiation Oncology | Admitting: Radiation Oncology

## 2021-03-30 DIAGNOSIS — C50411 Malignant neoplasm of upper-outer quadrant of right female breast: Secondary | ICD-10-CM | POA: Diagnosis not present

## 2021-03-31 ENCOUNTER — Ambulatory Visit: Payer: Medicaid Other | Admitting: Radiation Oncology

## 2021-03-31 ENCOUNTER — Ambulatory Visit
Admission: RE | Admit: 2021-03-31 | Discharge: 2021-03-31 | Disposition: A | Discharge status: Home / Self Care | Payer: Medicaid Other | Source: Ambulatory Visit | Attending: Radiation Oncology | Admitting: Radiation Oncology

## 2021-03-31 DIAGNOSIS — C50411 Malignant neoplasm of upper-outer quadrant of right female breast: Secondary | ICD-10-CM | POA: Diagnosis not present

## 2021-04-01 ENCOUNTER — Ambulatory Visit
Admission: RE | Admit: 2021-04-01 | Discharge: 2021-04-01 | Disposition: A | Discharge status: Home / Self Care | Payer: Medicaid Other | Source: Ambulatory Visit | Attending: Radiation Oncology | Admitting: Radiation Oncology

## 2021-04-01 ENCOUNTER — Other Ambulatory Visit: Payer: Self-pay

## 2021-04-01 DIAGNOSIS — C50411 Malignant neoplasm of upper-outer quadrant of right female breast: Secondary | ICD-10-CM | POA: Diagnosis not present

## 2021-04-02 ENCOUNTER — Ambulatory Visit
Admission: RE | Admit: 2021-04-02 | Discharge: 2021-04-02 | Disposition: A | Discharge status: Home / Self Care | Payer: Medicaid Other | Source: Ambulatory Visit | Attending: Radiation Oncology | Admitting: Radiation Oncology

## 2021-04-02 DIAGNOSIS — C50411 Malignant neoplasm of upper-outer quadrant of right female breast: Secondary | ICD-10-CM | POA: Diagnosis not present

## 2021-04-03 ENCOUNTER — Other Ambulatory Visit: Payer: Self-pay

## 2021-04-03 ENCOUNTER — Ambulatory Visit
Admission: RE | Admit: 2021-04-03 | Discharge: 2021-04-03 | Disposition: A | Discharge status: Home / Self Care | Payer: Medicaid Other | Source: Ambulatory Visit | Attending: Radiation Oncology | Admitting: Radiation Oncology

## 2021-04-03 DIAGNOSIS — C50411 Malignant neoplasm of upper-outer quadrant of right female breast: Secondary | ICD-10-CM | POA: Diagnosis not present

## 2021-04-06 ENCOUNTER — Other Ambulatory Visit: Payer: Self-pay

## 2021-04-06 ENCOUNTER — Ambulatory Visit
Admission: RE | Admit: 2021-04-06 | Discharge: 2021-04-06 | Disposition: A | Discharge status: Home / Self Care | Payer: Medicaid Other | Source: Ambulatory Visit | Attending: Radiation Oncology | Admitting: Radiation Oncology

## 2021-04-06 DIAGNOSIS — C50411 Malignant neoplasm of upper-outer quadrant of right female breast: Secondary | ICD-10-CM | POA: Diagnosis not present

## 2021-04-07 ENCOUNTER — Ambulatory Visit: Payer: Medicaid Other | Admitting: Radiation Oncology

## 2021-04-07 ENCOUNTER — Ambulatory Visit
Admission: RE | Admit: 2021-04-07 | Discharge: 2021-04-07 | Disposition: A | Discharge status: Home / Self Care | Payer: Medicaid Other | Source: Ambulatory Visit | Attending: Radiation Oncology | Admitting: Radiation Oncology

## 2021-04-07 DIAGNOSIS — C50411 Malignant neoplasm of upper-outer quadrant of right female breast: Secondary | ICD-10-CM | POA: Diagnosis not present

## 2021-04-07 DIAGNOSIS — Z808 Family history of malignant neoplasm of other organs or systems: Secondary | ICD-10-CM

## 2021-04-07 MED ORDER — SILVER SULFADIAZINE 1 % EX CREA
TOPICAL_CREAM | Freq: Every day | CUTANEOUS | Status: DC
Start: 2021-04-07 — End: 2021-04-08

## 2021-04-08 ENCOUNTER — Ambulatory Visit
Admission: RE | Admit: 2021-04-08 | Discharge: 2021-04-08 | Disposition: A | Discharge status: Home / Self Care | Payer: Medicaid Other | Source: Ambulatory Visit | Attending: Radiation Oncology | Admitting: Radiation Oncology

## 2021-04-08 ENCOUNTER — Other Ambulatory Visit: Payer: Self-pay

## 2021-04-08 DIAGNOSIS — C50411 Malignant neoplasm of upper-outer quadrant of right female breast: Secondary | ICD-10-CM | POA: Diagnosis not present

## 2021-04-09 ENCOUNTER — Ambulatory Visit: Payer: Medicaid Other | Admitting: Radiation Oncology

## 2021-04-09 ENCOUNTER — Ambulatory Visit
Admission: RE | Admit: 2021-04-09 | Discharge: 2021-04-09 | Disposition: A | Discharge status: Home / Self Care | Payer: Medicaid Other | Source: Ambulatory Visit | Attending: Radiation Oncology | Admitting: Radiation Oncology

## 2021-04-09 DIAGNOSIS — C50411 Malignant neoplasm of upper-outer quadrant of right female breast: Secondary | ICD-10-CM

## 2021-04-10 ENCOUNTER — Other Ambulatory Visit: Payer: Self-pay

## 2021-04-10 ENCOUNTER — Ambulatory Visit
Admission: RE | Admit: 2021-04-10 | Discharge: 2021-04-10 | Disposition: A | Discharge status: Home / Self Care | Payer: Medicaid Other | Source: Ambulatory Visit | Attending: Radiation Oncology | Admitting: Radiation Oncology

## 2021-04-10 DIAGNOSIS — C773 Secondary and unspecified malignant neoplasm of axilla and upper limb lymph nodes: Secondary | ICD-10-CM | POA: Diagnosis not present

## 2021-04-10 DIAGNOSIS — Z9011 Acquired absence of right breast and nipple: Secondary | ICD-10-CM | POA: Diagnosis not present

## 2021-04-10 DIAGNOSIS — Z808 Family history of malignant neoplasm of other organs or systems: Secondary | ICD-10-CM | POA: Insufficient documentation

## 2021-04-10 DIAGNOSIS — Z170 Estrogen receptor positive status [ER+]: Secondary | ICD-10-CM | POA: Diagnosis not present

## 2021-04-10 DIAGNOSIS — C50411 Malignant neoplasm of upper-outer quadrant of right female breast: Secondary | ICD-10-CM | POA: Insufficient documentation

## 2021-04-14 ENCOUNTER — Other Ambulatory Visit: Payer: Self-pay

## 2021-04-14 ENCOUNTER — Ambulatory Visit
Admission: RE | Admit: 2021-04-14 | Discharge: 2021-04-14 | Disposition: A | Discharge status: Home / Self Care | Payer: Medicaid Other | Source: Ambulatory Visit | Attending: Radiation Oncology | Admitting: Radiation Oncology

## 2021-04-14 DIAGNOSIS — C50411 Malignant neoplasm of upper-outer quadrant of right female breast: Secondary | ICD-10-CM | POA: Diagnosis not present

## 2021-04-15 ENCOUNTER — Ambulatory Visit
Admission: RE | Admit: 2021-04-15 | Discharge: 2021-04-15 | Disposition: A | Discharge status: Home / Self Care | Payer: Medicaid Other | Source: Ambulatory Visit | Attending: Radiation Oncology | Admitting: Radiation Oncology

## 2021-04-15 DIAGNOSIS — C50411 Malignant neoplasm of upper-outer quadrant of right female breast: Secondary | ICD-10-CM | POA: Diagnosis not present

## 2021-04-15 NOTE — Progress Notes (Signed)
Patient Care Team: Inda Coke, [place] as PCP - General (Physician Assistant) Jacelyn Pi, MD as Referring Physician (Endocrinology) Everlene Farrier, MD as Consulting Physician (Obstetrics and Gynecology) Mauro Kaufmann, RN as Oncology Nurse Navigator Rockwell Germany, RN as Oncology Nurse Navigator  DIAGNOSIS:    ICD-10-CM   1. Malignant neoplasm of upper-outer quadrant of right breast in female, estrogen receptor positive ([place])  C50.411    Z17.0       SUMMARY OF ONCOLOGIC HISTORY: Oncology History  Malignant neoplasm of upper-outer quadrant of right female breast ([place])  12/30/2020 Initial Diagnosis   A right breast mass was palpated on clinical exam. Diagnostic mammogram and US showed calcifications in the upper outer and upper inner right breast, 9.7cm, and multiple masses from the 9-2 o'clock position, 1.8cm, 3.3cm, 1.1cm, 2.8cm, 0.8cm, and 2 abnormal lymph nodes in the right axilla. Biopsy showed invasive mammary carcinoma in the breast and axilla, grade 2. ER 100%, PR 100%, Ki-67 20%, HER-2 negative   01/01/2021 Cancer Staging   Staging form: Breast, AJCC 8th Edition - Clinical stage from 01/01/2021: Stage IIA (cT3, cN1, cM0, G2, ER+, PR+, HER2-) - Signed by Nicholas Lose, MD on 01/01/2021  Stage prefix: Initial diagnosis  Histologic grading system: 3 grade system    01/08/2021 Genetic Testing   Negative hereditary cancer genetic testing: no pathogenic variants detected in [facility] Multi-Cancer Panel.  The report date is January 08, 2021.    The Multi-Gene Panel offered by [facility] includes sequencing and/or deletion duplication testing of the following 84 genes: AIP, ALK, APC, ATM, AXIN2,BAP1,  BARD1, BLM, BMPR1A, BRCA1, BRCA2, BRIP1, CASR, CDC73, CDH1, CDK4, CDKN1B, CDKN1C, CDKN2A (p14ARF), CDKN2A (p16INK4a), CEBPA, CHEK2, CTNNA1, DICER1, DIS3L2, EGFR (c.2369C>T, p.Thr790Met variant only), EPCAM (Deletion/duplication testing only), FH, FLCN, GATA2, GPC3, GREM1 (Promoter region  deletion/duplication testing only), HOXB13 (c.251G>A, p.Gly84Glu), HRAS, KIT, MAX, MEN1, MET, MITF (c.952G>A, p.Glu318Lys variant only), MLH1, MSH2, MSH3, MSH6, MUTYH, NBN, NF1, NF2, NTHL1, PALB2, PDGFRA, PHOX2B, PMS2, POLD1, POLE, POT1, PRKAR1A, PTCH1, PTEN, RAD50, RAD51C, RAD51D, RB1, RECQL4, RET, RUNX1, SDHAF2, SDHA (sequence changes only), SDHB, SDHC, SDHD, SMAD4, SMARCA4, SMARCB1, SMARCE1, STK11, SUFU, TERC, TERT, TMEM127, TP53, TSC1, TSC2, VHL, WRN and WT1.    01/23/2021 Surgery   Right mastectomy: Grade 2 IDC, 6.3 cm, intermediate grade DCIS, margins negative, carcinoma involves the dermis of the nipple, 1 intramammary lymph node positive, 2 additional lymph nodes in the right axilla positive out of 4 ER 100%, PR 100%, HER2 negative, Ki-67 20% T3N1A   02/02/2021 Cancer Staging   Staging form: Breast, AJCC 8th Edition - Pathologic stage from 02/02/2021: Stage IB (pT3, pN1a, cM0, G2, ER+, PR+, HER2-) - Signed by Nicholas Lose, MD on 02/02/2021  Histologic grading system: 3 grade system      CHIEF COMPLIANT: Follow-up of right breast cancer  INTERVAL HISTORY: Phyllis Harris is a 40 y.o. with above-mentioned history of right breast cancer having undergone a right mastectomy with reconstruction. She reports to the clinic today for follow-up.  She has radiation dermatitis.  She wonders if she is in menopause because she has an IUD.  Previous to breast surgery she took tamoxifen and had tolerated it well.  Today we are here to discuss further antiestrogen therapy as well as the role of abemaciclib  ALLERGIES:  has No Known Allergies.  MEDICATIONS:  Current Outpatient Medications  Medication Sig Dispense Refill   acetaminophen (TYLENOL) 500 MG tablet Take by mouth.     levonorgestrel (MIRENA) 20 MCG/24HR IUD 1  each by Intrauterine route once. Inserted by GYN Dr. Gaetano Net 11/04/2017, needs to be removed in 10/2022.     levothyroxine (SYNTHROID, LEVOTHROID) 175 MCG tablet Take 175 mcg by mouth  daily before breakfast.     methocarbamol (ROBAXIN) 500 MG tablet methocarbamol 500 mg tablet  TAKE 1 TABLET BY MOUTH EVERY 8 HOURS AS NEEDED.     Multiple Vitamin (MULTIVITAMIN WITH MINERALS) TABS tablet Take 1 tablet by mouth in the morning.     sertraline (ZOLOFT) 50 MG tablet Take 50 mg by mouth in the morning.     No current facility-administered medications for this visit.    PHYSICAL EXAMINATION: ECOG PERFORMANCE STATUS: 1 - Symptomatic but completely ambulatory  Vitals:   04/16/21 1154  BP: (!) 158/61  Pulse: 72  Resp: 18  Temp: 98.1 F (36.7 C)  SpO2: 98%   Filed Weights   04/16/21 1154  Weight: 223 lb 7 oz (101.4 kg)       LABORATORY DATA:  I have reviewed the data as listed CMP Latest Ref Rng & Units 01/21/2021 01/01/2021 05/13/2014  Glucose 70 - 99 mg/dL 112(H) 94 133(H)  BUN 6 - 20 mg/dL _0 Creatinine 0.44 - 1.00 mg/dL 0.75 0.76 0.63  Sodium 135 - 145 mmol/L 141 142 137  Potassium 3.5 - 5.1 mmol/L 4.3 4.2 5.0  Chloride 98 - 111 mmol/L 107 103 101  CO2 22 - 32 mmol/L _1 Calcium 8.9 - 10.3 mg/dL 8.2(L) 8.9 9.0  Total Protein 6.5 - 8.1 g/dL - 7.8 -  Total Bilirubin 0.3 - 1.2 mg/dL - 0.3 -  Alkaline Phos 38 - 126 U/L - 65 -  AST 15 - 41 U/L - 25 -  ALT 0 - 44 U/L - 23 -    Lab Results  Component Value Date   WBC 6.3 01/21/2021   HGB 13.6 01/21/2021   HCT 41.3 01/21/2021   MCV 92.0 01/21/2021   PLT 286 01/21/2021   NEUTROABS 7.0 01/01/2021    ASSESSMENT & PLAN:  Malignant neoplasm of upper-outer quadrant of right female breast ([place]) 01/23/2021: Right mastectomy: Grade 2 IDC, 6.3 cm, intermediate grade DCIS, margins negative, carcinoma involves the dermis of the nipple, 1 intramammary lymph node positive, 2 additional lymph nodes in the right axilla positive out of 4 ER 100%, PR 100%, HER2 negative, Ki-67 20% T3N1A stage Ib   Recommendation: 1.  MammaPrint: 02/22/2021: Low risk 2. adjuvant radiation 03/05/2021-04/17/2021 3.  Follow-up adjuvant  antiestrogen therapy with tamoxifen versus aromatase inhibitor (patient tells me that several members of her family have had early menopause.  Therefore we would like to check for [facility] and estradiol levels today) 4.  Abemaciclib adjuvant treatment: Discussed the pros and cons of adding abemaciclib based on [facility] E clinical trial I discussed with her that based upon the clinical trial there was a 4% difference in progression free survival between the 2 groups.  Premenopausal patients were also included in the [facility] E trial.  I will call her in 2 weeks to discuss results of the menopause labs and then determine the appropriate adjuvant treatment.    No orders of the defined types were placed in this encounter.  The patient has a good understanding of the overall plan. she agrees with it. she will call with any problems that may develop before the next visit here.  Total time spent: 30 mins including face to face time and time spent for planning, charting and coordination of  care  Rulon Eisenmenger, MD, MPH 04/16/2021  I, Thana Ates, am acting as scribe for Dr. Nicholas Lose.  I have reviewed the above documentation for accuracy and completeness, and I agree with the above.

## 2021-04-16 ENCOUNTER — Ambulatory Visit: Payer: Medicaid Other

## 2021-04-16 ENCOUNTER — Inpatient Hospital Stay: Payer: Medicaid Other | Attending: Hematology and Oncology | Admitting: Hematology and Oncology

## 2021-04-16 ENCOUNTER — Other Ambulatory Visit: Payer: Self-pay

## 2021-04-16 ENCOUNTER — Ambulatory Visit
Admission: RE | Admit: 2021-04-16 | Discharge: 2021-04-16 | Disposition: A | Discharge status: Home / Self Care | Payer: Medicaid Other | Source: Ambulatory Visit | Attending: Radiation Oncology | Admitting: Radiation Oncology

## 2021-04-16 ENCOUNTER — Telehealth: Payer: Self-pay | Admitting: Hematology and Oncology

## 2021-04-16 ENCOUNTER — Inpatient Hospital Stay: Payer: Medicaid Other

## 2021-04-16 DIAGNOSIS — Z170 Estrogen receptor positive status [ER+]: Secondary | ICD-10-CM | POA: Diagnosis not present

## 2021-04-16 DIAGNOSIS — C50411 Malignant neoplasm of upper-outer quadrant of right female breast: Secondary | ICD-10-CM | POA: Diagnosis not present

## 2021-04-16 DIAGNOSIS — C773 Secondary and unspecified malignant neoplasm of axilla and upper limb lymph nodes: Secondary | ICD-10-CM | POA: Insufficient documentation

## 2021-04-16 DIAGNOSIS — Z9011 Acquired absence of right breast and nipple: Secondary | ICD-10-CM | POA: Insufficient documentation

## 2021-04-16 NOTE — Telephone Encounter (Signed)
Scheduled appointment per 07/07 los. Patient is aware.

## 2021-04-16 NOTE — Assessment & Plan Note (Signed)
01/23/2021: Right mastectomy: Grade 2 IDC, 6.3 cm, intermediate grade DCIS, margins negative, carcinoma involves the dermis of the nipple, 1 intramammary lymph node positive, 2 additional lymph nodes in the right axilla positive out of 4 ER 100%, PR 100%, HER2 negative, Ki-67 20% T3N1A stage Ib  Recommendation: 1.  MammaPrint: 02/22/2021: Low risk 2. adjuvant radiation 03/05/2021-04/17/2021 3.  Follow-up adjuvant antiestrogen therapy with tamoxifen. 4.  Abemaciclib adjuvant treatment: Discussed the pros and cons of adding abemaciclib based on [facility] E clinical trial  Return to clinic in 3 months for follow-up

## 2021-04-17 ENCOUNTER — Encounter: Payer: Self-pay | Admitting: Radiation Oncology

## 2021-04-17 ENCOUNTER — Telehealth: Payer: Self-pay | Admitting: Hematology and Oncology

## 2021-04-17 ENCOUNTER — Encounter: Payer: Self-pay | Admitting: *Deleted

## 2021-04-17 ENCOUNTER — Ambulatory Visit
Admission: RE | Admit: 2021-04-17 | Discharge: 2021-04-17 | Disposition: A | Discharge status: Home / Self Care | Payer: Medicaid Other | Source: Ambulatory Visit | Attending: Radiation Oncology | Admitting: Radiation Oncology

## 2021-04-17 DIAGNOSIS — C50411 Malignant neoplasm of upper-outer quadrant of right female breast: Secondary | ICD-10-CM | POA: Diagnosis not present

## 2021-04-17 DIAGNOSIS — Z923 Personal history of irradiation: Secondary | ICD-10-CM

## 2021-04-17 HISTORY — DX: Personal history of irradiation: Z92.3

## 2021-04-17 LAB — FOLLICLE STIMULATING HORMONE: FSH: 4.9 m[IU]/mL

## 2021-04-17 NOTE — Telephone Encounter (Signed)
I informed her that based on [facility] of 4.9, she is not in menopause. Therefore she will start tamoxifen in August 1 She will still have to make a decision regarding adjuvant abemaciclib.

## 2021-04-20 ENCOUNTER — Other Ambulatory Visit: Payer: Self-pay

## 2021-04-20 ENCOUNTER — Ambulatory Visit: Payer: Medicaid Other | Attending: General Surgery

## 2021-04-20 DIAGNOSIS — Z483 Aftercare following surgery for neoplasm: Secondary | ICD-10-CM

## 2021-04-20 NOTE — Therapy (Signed)
[place], [place], [address] Phone: [phone]   Fax:  [phone]  Physical Therapy Treatment  Patient Details  Name: Phyllis Harris MRN: [id number] Date of Birth: 16-Apr-1981 Referring Provider (PT): Dr. Donne Hazel   Encounter Date: 04/20/2021   PT End of Session - 04/20/21 0842     Visit Number 2   # unchanged due to scren only   PT Start Time 0832    PT Stop Time 0842    PT Time Calculation (min) 10 min    Activity Tolerance Patient tolerated treatment well    Behavior During Therapy [facility] for tasks assessed/performed             Past Medical History:  Diagnosis Date   Anxiety    Cancer ([place]) 2008   thyroid    Family history of thyroid cancer 01/02/2021   GERD (gastroesophageal reflux disease)    with pregnancy   History of chicken pox    History of thyroid cancer 01/02/2021   Hypothyroidism    PONV (postoperative nausea and vomiting)     Past Surgical History:  Procedure Laterality Date   BREAST RECONSTRUCTION WITH PLACEMENT OF TISSUE EXPANDER AND ALLODERM Right 01/23/2021   Procedure: BREAST RECONSTRUCTION WITH PLACEMENT OF TISSUE EXPANDER AND ALLODERM;  Surgeon: Irene Limbo, MD;  Location: [place];  Service: Plastics;  Laterality: Right;   CESAREAN SECTION  2010   CESAREAN SECTION N/A 05/14/2014   Procedure: CESAREAN SECTION;  Surgeon: Cyril Mourning, MD;  Location: [facility];  Service: Obstetrics;  Laterality: N/A;   DILATION AND CURETTAGE OF UTERUS     miscarriage   MASTECTOMY W/ SENTINEL NODE BIOPSY Right 01/23/2021   Procedure: RIGHT MASTECTOMY WITH AXILLARY SENTINEL LYMPH NODE BIOPSY;  Surgeon: Rolm Bookbinder, MD;  Location: [place];  Service: General;  Laterality: Right;   RADIOACTIVE SEED GUIDED AXILLARY SENTINEL LYMPH NODE Right 01/23/2021   Procedure: RADIOACTIVE SEED GUIDED RIGHT AXILLARY SENTINEL LYMPH NODE EXCISION;  Surgeon: Rolm Bookbinder, MD;  Location: [place];   Service: General;  Laterality: Right;   THYROIDECTOMY      There were no vitals filed for this visit.   Subjective Assessment - 04/20/21 0836     Subjective Pt returns for her 3 month L-Dex screen.    Pertinent History Rt mastectomy with expander 01/23/21 with Dr Donne Hazel and [place] with 2/4 nodes positive.  No chemotherapy needed, but will do doing radiation.                    L-DEX FLOWSHEETS - 04/20/21 0800       L-DEX LYMPHEDEMA SCREENING   Measurement Type Unilateral    L-DEX MEASUREMENT EXTREMITY Upper Extremity    POSITION  Standing    DOMINANT SIDE Right    At Risk Side Right    BASELINE SCORE (UNILATERAL) -4.2    L-DEX SCORE (UNILATERAL) 1.6    VALUE CHANGE (UNILAT) 5.8                                    PT Long Term Goals - 02/25/21 0940       PT LONG TERM GOAL #1   Title Pt will have right shoulder ROM and function nearly equal to left    Status Achieved                   Plan -  04/20/21 0842     Clinical Impression Statement Pt returns for her 3 month L-Dex screen. Her change from baseline of 5.8 is WNLs, though close to subclinical lymphedema and as she has just finished radiation educated her that would be beneficial for her to, for the short term, weare her compression sleeve when doing activities that increase her HR, like walking for exercise. Pt verbalized understanding and is agreeable to return in next 3 months for next L-Dex screen.    PT Next Visit Plan Cont every 3 month L-Dex screens for up to 2 years from [facility].    Consulted and Agree with Plan of Care Patient             Patient will benefit from skilled therapeutic intervention in order to improve the following deficits and impairments:     Visit Diagnosis: Aftercare following surgery for neoplasm     Problem List Patient Active Problem List   Diagnosis Date Noted   S/P mastectomy, right 01/23/2021   Genetic testing 01/09/2021    Family history of thyroid cancer 01/02/2021   History of thyroid cancer 01/02/2021   Malignant neoplasm of upper-outer quadrant of right female breast ([place]) 12/30/2020   S/P cesarean section 05/14/2014    [name], PTA 04/20/2021, 8:45 AM  [place], [place], [address] Phone: [phone]   Fax:  [phone]  Name: Phyllis Harris MRN: [id number] Date of Birth: 09-27-1981

## 2021-04-22 LAB — ESTRADIOL, ULTRA SENS: Estradiol, Sensitive: 46.2 pg/mL

## 2021-04-27 NOTE — Progress Notes (Signed)
HEMATOLOGY-ONCOLOGY TELEPHONE VISIT PROGRESS NOTE  I connected with Gerhard Munch on 04/28/2021 at 11:45 AM EDT by telephone and verified that I am speaking with the correct person using two identifiers.  I discussed the limitations, risks, security and privacy concerns of performing an evaluation and management service by telephone and the availability of in person appointments.  I also discussed with the patient that there may be a patient responsible charge related to this service. The patient expressed understanding and agreed to proceed.   History of Present Illness: Phyllis Harris is a 40 y.o. female with above-mentioned history of right breast cancer having undergone a right mastectomy with reconstruction and completed radiation. She reports via telephone today for follow-up.  Oncology History  Malignant neoplasm of upper-outer quadrant of right female breast ([place])  12/30/2020 Initial Diagnosis   A right breast mass was palpated on clinical exam. Diagnostic mammogram and US showed calcifications in the upper outer and upper inner right breast, 9.7cm, and multiple masses from the 9-2 o'clock position, 1.8cm, 3.3cm, 1.1cm, 2.8cm, 0.8cm, and 2 abnormal lymph nodes in the right axilla. Biopsy showed invasive mammary carcinoma in the breast and axilla, grade 2. ER 100%, PR 100%, Ki-67 20%, HER-2 negative   01/01/2021 Cancer Staging   Staging form: Breast, AJCC 8th Edition - Clinical stage from 01/01/2021: Stage IIA (cT3, cN1, cM0, G2, ER+, PR+, HER2-) - Signed by Nicholas Lose, MD on 01/01/2021  Stage prefix: Initial diagnosis  Histologic grading system: 3 grade system    01/08/2021 Genetic Testing   Negative hereditary cancer genetic testing: no pathogenic variants detected in [facility] Multi-Cancer Panel.  The report date is January 08, 2021.    The Multi-Gene Panel offered by [facility] includes sequencing and/or deletion duplication testing of the following 84 genes: AIP, ALK, APC, ATM,  AXIN2,BAP1,  BARD1, BLM, BMPR1A, BRCA1, BRCA2, BRIP1, CASR, CDC73, CDH1, CDK4, CDKN1B, CDKN1C, CDKN2A (p14ARF), CDKN2A (p16INK4a), CEBPA, CHEK2, CTNNA1, DICER1, DIS3L2, EGFR (c.2369C>T, p.Thr790Met variant only), EPCAM (Deletion/duplication testing only), FH, FLCN, GATA2, GPC3, GREM1 (Promoter region deletion/duplication testing only), HOXB13 (c.251G>A, p.Gly84Glu), HRAS, KIT, MAX, MEN1, MET, MITF (c.952G>A, p.Glu318Lys variant only), MLH1, MSH2, MSH3, MSH6, MUTYH, NBN, NF1, NF2, NTHL1, PALB2, PDGFRA, PHOX2B, PMS2, POLD1, POLE, POT1, PRKAR1A, PTCH1, PTEN, RAD50, RAD51C, RAD51D, RB1, RECQL4, RET, RUNX1, SDHAF2, SDHA (sequence changes only), SDHB, SDHC, SDHD, SMAD4, SMARCA4, SMARCB1, SMARCE1, STK11, SUFU, TERC, TERT, TMEM127, TP53, TSC1, TSC2, VHL, WRN and WT1.    01/23/2021 Surgery   Right mastectomy: Grade 2 IDC, 6.3 cm, intermediate grade DCIS, margins negative, carcinoma involves the dermis of the nipple, 1 intramammary lymph node positive, 2 additional lymph nodes in the right axilla positive out of 4 ER 100%, PR 100%, HER2 negative, Ki-67 20% T3N1A   02/02/2021 Cancer Staging   Staging form: Breast, AJCC 8th Edition - Pathologic stage from 02/02/2021: Stage IB (pT3, pN1a, cM0, G2, ER+, PR+, HER2-) - Signed by Nicholas Lose, MD on 02/02/2021  Histologic grading system: 3 grade system    03/05/2021 - 04/17/2021 Radiation Therapy   Adjuvant radiation therapy     Observations/Objective:  She is healing and recovering very well from surgery.   Assessment Plan:  Malignant neoplasm of upper-outer quadrant of right female breast ([place]) 01/23/2021: Right mastectomy: Grade 2 IDC, 6.3 cm, intermediate grade DCIS, margins negative, carcinoma involves the dermis of the nipple, 1 intramammary lymph node positive, 2 additional lymph nodes in the right axilla positive out of 4 ER 100%, PR 100%, HER2 negative, Ki-67 20% T3N1A  stage Ib   Recommendation: 1.  MammaPrint: 02/22/2021: Low risk 2. adjuvant radiation  03/05/2021-04/17/2021 3.  Follow-up adjuvant antiestrogen therapy with tamoxifen  (premenopausal based on FSH 4.9, estradiol 46.2) x10 years 4.  Recommended abemaciclib adjuvant therapy based on [facility] E clinical trial x2 years Patient is not keen on taking abemaciclib because of concern for toxicities and the fact that she has to take care of her kids and also work as a Radio producer. I discussed with her that she does not have to start at this time. When she comes back in 3 months she can have a conversation about Verzenio at that time.  Return to clinic in 3 months for survivorship care plan visit    I discussed the assessment and treatment plan with the patient. The patient was provided an opportunity to ask questions and all were answered. The patient agreed with the plan and demonstrated an understanding of the instructions. The patient was advised to call back or seek an in-person evaluation if the symptoms worsen or if the condition fails to improve as anticipated.   Total time spent: 12 mins including non-face to face time and time spent for planning, charting and coordination of care  Rulon Eisenmenger, MD 04/28/2021    I, Thana Ates, am acting as scribe for Nicholas Lose, MD.  I have reviewed the above documentation for accuracy and completeness, and I agree with the above.

## 2021-04-28 ENCOUNTER — Inpatient Hospital Stay (HOSPITAL_BASED_OUTPATIENT_CLINIC_OR_DEPARTMENT_OTHER): Payer: Medicaid Other | Admitting: Hematology and Oncology

## 2021-04-28 DIAGNOSIS — C50411 Malignant neoplasm of upper-outer quadrant of right female breast: Secondary | ICD-10-CM | POA: Diagnosis not present

## 2021-04-28 DIAGNOSIS — Z170 Estrogen receptor positive status [ER+]: Secondary | ICD-10-CM | POA: Diagnosis not present

## 2021-04-28 NOTE — Assessment & Plan Note (Signed)
01/23/2021:Right mastectomy: Grade 2 IDC, 6.3 cm, intermediate grade DCIS, margins negative, carcinoma involves the dermis of the nipple, 1 intramammary lymph node positive, 2 additional lymph nodes in the right axilla positive out of 4 ER 100%, PR 100%, HER2 negative, Ki-67 20% T3N1Astage Ib  Recommendation: 1.MammaPrint: 02/22/2021: Low risk 2.adjuvant radiation 03/05/2021-04/17/2021 3.Follow-up adjuvant antiestrogen therapy with tamoxifen  (premenopausal based on FSH 4.9, estradiol 46.2) x10 years 4.  Recommended abemaciclib adjuvant therapy based on [facility] E clinical trial x2 years  Return to clinic in 3 months for survivorship care plan visit

## 2021-04-30 ENCOUNTER — Telehealth: Payer: Self-pay | Admitting: Hematology and Oncology

## 2021-04-30 NOTE — Telephone Encounter (Signed)
Scheduled per 7/19 los. Called pt and left a msg

## 2021-05-19 ENCOUNTER — Encounter: Payer: Self-pay | Admitting: Radiology

## 2021-05-20 ENCOUNTER — Other Ambulatory Visit: Payer: Self-pay | Admitting: Adult Health

## 2021-05-20 DIAGNOSIS — C50411 Malignant neoplasm of upper-outer quadrant of right female breast: Secondary | ICD-10-CM

## 2021-05-26 ENCOUNTER — Telehealth: Payer: Self-pay | Admitting: *Deleted

## 2021-05-26 NOTE — Telephone Encounter (Signed)
Returned patient's phone call, spoke with patient 

## 2021-05-28 ENCOUNTER — Ambulatory Visit: Payer: Medicaid Other | Admitting: Radiation Oncology

## 2021-06-10 NOTE — Progress Notes (Incomplete)
  Radiation Oncology         [phone] ________________________________  Patient Name: Phyllis Harris MRN: [id number] DOB: 1981-06-04 Referring Physician: Nicholas Lose (Profile Not Attached) Date of Service: 04/17/2021 [facility], [place]  End Of Treatment Note  Diagnoses: C50.411-Malignant neoplasm of upper-outer quadrant of right female breast  Cancer Staging: Stage IB (PT3, PN1a) Right Breast UQs, IDC with DCIS, ER+ / PR+ / Her2(-), Grade 2  Intent: Curative  Radiation Treatment Dates: 03/04/2021 through 04/17/2021 Site Technique Total Dose (Gy) Dose per Fx (Gy) Completed Fx Beam Energies  Chest Wall, Right: CW_Rt 3D 50/50 2 25/25 10X  Sclav-RT: SCV_Rt 3D 50/50 2 25/25 6X, 10X  Chest Wall, Right: CW_Rt_Bst Electron 10/10 2 5/5 6E   Narrative: The patient tolerated radiation therapy relatively well with the exception of reported mild fatigue and tenderness under her arm and breast.   The right reconstructed chest area shows erythema and hyperpigmentation changes. Slight breakdown is visualized in the inframammary fold and axillary region for which she is using Silvadene. Skin is otherwise without moist desquamation.    Plan: The patient will follow-up with radiation oncology in one month .  ________________________________________________ -----------------------------------  Blair Promise, PhD, MD  This document serves as a record of services personally performed by Gery Pray, MD. It was created on his behalf by Roney Mans, a trained medical scribe. The creation of this record is based on the scribe's personal observations and the provider's statements to them. This document has been checked and approved by the attending provider.

## 2021-06-10 NOTE — Progress Notes (Signed)
Radiation Oncology         [phone] ________________________________  Name: Phyllis Harris MRN: [id number]  Date: 06/11/2021  DOB: 06-03-1981  Follow-Up Visit Note  CC: Inda Coke, [place]  Nicholas Lose, MD    ICD-10-CM   1. Malignant neoplasm of upper-outer quadrant of right breast in female, estrogen receptor positive ([place])  C50.411    Z17.0       Diagnosis: C50.411-Malignant neoplasm of upper-outer quadrant of right female breast  Stage IB (PT3, PN1a) Right Breast UQs, IDC with DCIS, ER+ / PR+ / Her2(-), Grade 2  Interval Since Last Radiation: 1 month and 24 days    Intent: Curative  Radiation Treatment Dates: 03/04/2021 through 04/17/2021 Site Technique Total Dose (Gy) Dose per Fx (Gy) Completed Fx Beam Energies  Chest Wall, Right: CW_Rt 3D 50/50 2 25/25 10X  Sclav-RT: SCV_Rt 3D 50/50 2 25/25 6X, 10X  Chest Wall, Right: CW_Rt_Bst Electron 10/10 2 5/5 6E   Narrative:  The patient returns today for routine follow-up. She tolerated her recent radiation treatment relatively well with the exception of slight breakdown in the inframammary fold and axillary region (for which she was prescribed Silvadene); erythema and hyperpigmentation changes to her right chest area; mild fatigue; and tenderness under her arm and breast.     The patient followed up with Dr. Lindi Adie on 04/28/21 where he discussed with the patient her treatment plan post radiation; consisting of adjuvant antiestrogen therapy with tamoxifen x 10 years. The patient was in agreement with this plan however expressed her disinterest in the addition of abemaciclib adjuvant therapy due to concern of toxicities impeding on her work and family obligations.               The patient also followed up with Dr. Iran Planas on 05/13/21 to discuss breast reconstructive surgery options. The patient is planned to undergo left breast augmentation around late November-January depending on her holiday plans.   Allergies:  has No  Known Allergies.  Meds: Current Outpatient Medications  Medication Sig Dispense Refill   acetaminophen (TYLENOL) 500 MG tablet Take by mouth.     levonorgestrel (MIRENA) 20 MCG/24HR IUD 1 each by Intrauterine route once. Inserted by GYN Dr. Gaetano Net 11/04/2017, needs to be removed in 10/2022.     levothyroxine (SYNTHROID, LEVOTHROID) 175 MCG tablet Take 175 mcg by mouth daily before breakfast.     Multiple Vitamin (MULTIVITAMIN WITH MINERALS) TABS tablet Take 1 tablet by mouth in the morning.     sertraline (ZOLOFT) 50 MG tablet Take 50 mg by mouth in the morning.     tamoxifen (NOLVADEX) 20 MG tablet Take 20 mg by mouth daily.     methocarbamol (ROBAXIN) 500 MG tablet methocarbamol 500 mg tablet  TAKE 1 TABLET BY MOUTH EVERY 8 HOURS AS NEEDED. (Patient not taking: Reported on 06/11/2021)     No current facility-administered medications for this encounter.    Physical Findings: The patient is in no acute distress. Patient is alert and oriented.  height is '5\' 8"'  (1.727 m) and weight is 221 lb 9.6 oz (100.5 kg). Her temperature is 97.8 F (36.6 C). Her blood pressure is 118/76 and her pulse is 70. Her respiration is 20 and oxygen saturation is 99%. .  No significant changes. Lungs are clear to auscultation bilaterally. Heart has regular rate and rhythm. No palpable cervical, supraclavicular, or axillary adenopathy. Abdomen soft, non-tender, normal bowel sounds. Left breast: no palpable mass, nipple discharge or bleeding.  Reconstructed right chest  wall area shows some hyperpigmentation changes.  Tissue expander in place.  Skin is healed well.  Some slight erythema to the axillary area likely frictional in nature.  No palpable or visible signs of recurrence along the right chest region.   Lab Findings: Lab Results  Component Value Date   WBC 6.3 01/21/2021   HGB 13.6 01/21/2021   HCT 41.3 01/21/2021   MCV 92.0 01/21/2021   PLT 286 01/21/2021    Radiographic Findings: No results  found.  Impression:  C50.411-Malignant neoplasm of upper-outer quadrant of right female breast  Stage IB (PT3, PN1a) Right Breast UQs, IDC with DCIS, ER+ / PR+ / Her2(-), Grade 2  The patient is recovering from the effects of radiation.  Skin is healed well on the right side.  No evidence of recurrence on clinical exam.  Plan: As needed follow-up in radiation oncology.  Patient will continue close follow-up with medical oncology and surgery.  She will continue on tamoxifen.  She seems to be tolerating this well thus far to for mild increase in hot flashes.    ____________________________________  Blair Promise, PhD, MD   This document serves as a record of services personally performed by Gery Pray, MD. It was created on his behalf by Roney Mans, a trained medical scribe. The creation of this record is based on the scribe's personal observations and the provider's statements to them. This document has been checked and approved by the attending provider.

## 2021-06-11 ENCOUNTER — Ambulatory Visit
Admission: RE | Admit: 2021-06-11 | Discharge: 2021-06-11 | Disposition: A | Discharge status: Home / Self Care | Payer: Medicaid Other | Source: Ambulatory Visit | Attending: Hematology and Oncology | Admitting: Hematology and Oncology

## 2021-06-11 ENCOUNTER — Encounter: Payer: Self-pay | Admitting: Radiation Oncology

## 2021-06-11 ENCOUNTER — Other Ambulatory Visit: Payer: Self-pay

## 2021-06-11 VITALS — BP 118/76 | HR 70 | Temp 97.8°F | Resp 20 | Ht 68.0 in | Wt 221.6 lb

## 2021-06-11 DIAGNOSIS — C50411 Malignant neoplasm of upper-outer quadrant of right female breast: Secondary | ICD-10-CM

## 2021-06-11 DIAGNOSIS — Z170 Estrogen receptor positive status [ER+]: Secondary | ICD-10-CM | POA: Diagnosis present

## 2021-06-11 NOTE — Progress Notes (Signed)
Phyllis Harris is here today for follow up post radiation to the breast.   Breast Side:right   They completed their radiation on: 04/17/2021   Does the patient complain of any of the following: Post radiation skin issues: healing but continues to be red Breast Tenderness: numbness to right axilla Breast Swelling: denies Lymphadema: denies Range of Motion limitations: full range of motion Fatigue post radiation: mild fatigue continues Appetite good/fair/poor: good  Additional comments if applicable: none  Vitals:   06/11/21 1628  BP: 118/76  Pulse: 70  Resp: 20  Temp: 97.8 F (36.6 C)  SpO2: 99%  Weight: 221 lb 9.6 oz (100.5 kg)  Height: '5\' 8"'$  (1.727 m)

## 2021-06-18 ENCOUNTER — Encounter: Payer: Self-pay | Admitting: Hematology and Oncology

## 2021-06-19 ENCOUNTER — Encounter: Payer: Self-pay | Admitting: Adult Health

## 2021-06-19 ENCOUNTER — Telehealth (HOSPITAL_BASED_OUTPATIENT_CLINIC_OR_DEPARTMENT_OTHER): Payer: Medicaid Other | Admitting: Adult Health

## 2021-06-19 DIAGNOSIS — C50411 Malignant neoplasm of upper-outer quadrant of right female breast: Secondary | ICD-10-CM

## 2021-06-19 DIAGNOSIS — Z170 Estrogen receptor positive status [ER+]: Secondary | ICD-10-CM

## 2021-06-19 NOTE — Progress Notes (Signed)
Texted patient and waited in appt x 20 minutes, will offer patient a reschedule.  Wilber Bihari, NP

## 2021-06-29 ENCOUNTER — Encounter: Payer: Self-pay | Admitting: Rehabilitation

## 2021-07-16 ENCOUNTER — Ambulatory Visit: Payer: Medicaid Other | Attending: General Surgery | Admitting: Rehabilitation

## 2021-07-16 ENCOUNTER — Other Ambulatory Visit: Payer: Self-pay

## 2021-07-16 ENCOUNTER — Encounter: Payer: Self-pay | Admitting: Rehabilitation

## 2021-07-16 DIAGNOSIS — Z483 Aftercare following surgery for neoplasm: Secondary | ICD-10-CM | POA: Diagnosis not present

## 2021-07-16 DIAGNOSIS — Z170 Estrogen receptor positive status [ER+]: Secondary | ICD-10-CM | POA: Insufficient documentation

## 2021-07-16 DIAGNOSIS — C50411 Malignant neoplasm of upper-outer quadrant of right female breast: Secondary | ICD-10-CM | POA: Insufficient documentation

## 2021-07-16 DIAGNOSIS — R293 Abnormal posture: Secondary | ICD-10-CM | POA: Diagnosis present

## 2021-07-16 NOTE — Patient Instructions (Signed)
Access Code: [email or web address] URL: [email or web address]: 10/06/2022Prepared by: Marcene Brawn TevisExercises  Sidelying Shoulder Abduction - 1-2 x daily - 7 x weekly - 1 sets - 10 reps - 3 second hold  Seated Lateral Trunk Stretch on Swiss Ball - 1-2 x daily - 7 x weekly - 1 sets - 10 reps - 2-3 second hold  Supine Lower Trunk Rotation - 1 x daily - 7 x weekly - 1-3 sets - 5 reps - 6 second hold  Single Arm Doorway Pec Stretch at 90 Degrees Abduction - 1-3 x daily - 7 x weekly - 3 reps - 20 second hold  Seated Cervical Sidebending Stretch - 1-3 x daily - 7 x weekly - 1 sets - 3 reps - 20-30 seconds hold

## 2021-07-16 NOTE — Therapy (Signed)
[facility] @ [place], [place], [address] Phone:     Fax:     Physical Therapy Treatment  Patient Details  Name: Phyllis Harris MRN: [id number] Date of Birth: Feb 10, 1981 Referring Provider (PT): Dr. Donne Hazel   Encounter Date: 07/16/2021   PT End of Session - 07/16/21 1009     Visit Number 3    Number of Visits 9    Date for PT Re-Evaluation 08/27/21    PT Start Time 0805    PT Stop Time 0850    PT Time Calculation (min) 45 min    Activity Tolerance Patient tolerated treatment well    Behavior During Therapy [facility] for tasks assessed/performed             Past Medical History:  Diagnosis Date   Anxiety    Cancer ([place]) 2008   thyroid    Family history of thyroid cancer 01/02/2021   GERD (gastroesophageal reflux disease)    with pregnancy   History of chicken pox    History of radiation therapy 04/17/2021   03/04/2021-04/17/2021  right breast     Dr Gery Pray   History of thyroid cancer 01/02/2021   Hypothyroidism    PONV (postoperative nausea and vomiting)     Past Surgical History:  Procedure Laterality Date   BREAST RECONSTRUCTION WITH PLACEMENT OF TISSUE EXPANDER AND ALLODERM Right 01/23/2021   Procedure: BREAST RECONSTRUCTION WITH PLACEMENT OF TISSUE EXPANDER AND ALLODERM;  Surgeon: Irene Limbo, MD;  Location: [facility];  Service: Plastics;  Laterality: Right;   CESAREAN SECTION  2010   CESAREAN SECTION N/A 05/14/2014   Procedure: CESAREAN SECTION;  Surgeon: Cyril Mourning, MD;  Location: [facility];  Service: Obstetrics;  Laterality: N/A;   DILATION AND CURETTAGE OF UTERUS     miscarriage   MASTECTOMY W/ SENTINEL NODE BIOPSY Right 01/23/2021   Procedure: RIGHT MASTECTOMY WITH AXILLARY SENTINEL LYMPH NODE BIOPSY;  Surgeon: Rolm Bookbinder, MD;  Location: [facility];  Service: General;  Laterality: Right;   RADIOACTIVE SEED GUIDED AXILLARY SENTINEL LYMPH NODE Right 01/23/2021   Procedure:  RADIOACTIVE SEED GUIDED RIGHT AXILLARY SENTINEL LYMPH NODE EXCISION;  Surgeon: Rolm Bookbinder, MD;  Location: [facility];  Service: General;  Laterality: Right;   THYROIDECTOMY      There were no vitals filed for this visit.   Subjective Assessment - 07/16/21 0808     Subjective My upper arm is numb.  The pectoralis is just tight.  I have the switch to implants at the end of october.  I did okay with radiation.    Pertinent History Rt mastectomy with expander 01/23/21 with Dr Donne Hazel and Thimmappa with 2/4 nodes positive.  No chemotherapy needed, but will do doing radiation. (ended 04/17/21)    Limitations Lifting    Currently in Pain? No/denies   just a tightness               [facility] PT Assessment - 07/16/21 0001       Assessment   Medical Diagnosis Right breast Cancer    Referring Provider (PT) Dr. Donne Hazel    Onset Date/Surgical Date 01/23/21    Hand Dominance Right      Precautions   Precaution Comments lymphedema      Restrictions   Weight Bearing Restrictions No    Other Position/Activity Restrictions n      Balance Screen   Has the patient fallen in the past 6 months No  Has the patient had a decrease in activity level because of a fear of falling?  No    Is the patient reluctant to leave their home because of a fear of falling?  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[employer], walking,      Cognition   Overall Cognitive Status Within Functional Limits for tasks assessed      Observation/Other Assessments   Observations healed but darker from radiation in the axilla and under the breast - expander still soft      AROM   Right Shoulder Flexion 155 Degrees   with  pectoralis pull   Right Shoulder ABduction 163 Degrees   pull   Right Shoulder Horizontal ABduction 35 Degrees      Palpation   Palpation comment pectoralis tightness evident with flexion overhead, +2 ttp Rt UT               LYMPHEDEMA/ONCOLOGY QUESTIONNAIRE - 07/16/21 0001       Surgeries   Mastectomy Date 01/23/21    Sentinel Lymph Node Biopsy Date 01/23/21    Number Lymph Nodes Removed 4      Treatment   Active Chemotherapy Treatment No    Past Chemotherapy Treatment No    Active Radiation Treatment No    Past Radiation Treatment Yes    Current Hormone Treatment No      What other symptoms do you have   Are you Having Heaviness or Tightness Yes    Are you having Pain No             L-DEX FLOWSHEETS - 07/16/21 0800       L-DEX LYMPHEDEMA SCREENING   Measurement Type Unilateral    L-DEX MEASUREMENT EXTREMITY Upper Extremity    POSITION  Standing    DOMINANT SIDE Right    BASELINE SCORE (UNILATERAL) -4.2    L-DEX SCORE (UNILATERAL) -1.6    VALUE CHANGE (UNILAT) 2.6                       [facility] Adult PT Treatment/Exercise - 07/16/21 0001       Exercises   Exercises Other Exercises    Other Exercises  updated HEP per instruction section      Manual Therapy   Manual Therapy Edema management;Soft tissue mobilization;Passive ROM    Edema Management performed SOZO today which has decreased from last    Soft tissue mobilization to the Rt pectoralis and latissimus in stretched positions    Passive ROM to the Rt shoulder into flexion, abduction, ER, D2                     PT Education - 07/16/21 1009     Education Details updated HEP    Person(s) Educated Patient    Methods Explanation;Demonstration;Tactile cues;Verbal cues;Handout    Comprehension Verbalized understanding;Returned demonstration                 PT Long Term Goals - 07/16/21 1023       PT LONG TERM GOAL #1   Title Pt will have right shoulder ROM and  function nearly  equal to left    Time 6    Period Weeks    Status New      PT LONG TERM GOAL #2   Title Pt will be independent with final HEP    Time 6    Period Weeks    Status New                   Plan - 07/16/21 1010     Clinical Impression Statement Pt returns around 6 months post Rt mastectomy with expanders now post radiation with complaints of tightness in the Rt pectoralis and latissimus with decrease doverhead reach.  Repeated SOZO today which had decreased and remains in the normal range.  Pt felt better after STM/PROM focusing on Rt upper quadrant so pt will schedule as able into implant switch at the end of October and after if needed. Pt may have had some hesitation with with pushing movement and stretches too far and should loosen up with more education on this.    Personal Factors and Comorbidities Comorbidity 2    Comorbidities thyroid cancer, breast Cancer    Stability/Clinical Decision Making Stable/Uncomplicated    Clinical Decision Making Low    Rehab Potential Excellent    PT Frequency 1x / week    PT Duration 6 weeks    PT Treatment/Interventions ADLs/Self Care Home Management;Therapeutic exercise;Patient/family education;Manual lymph drainage;Manual techniques    PT Next Visit Plan how were new stretches? continue STM Rt upper quadrant Cont every 3 month L-Dex screens for up to 2 years from SLNB.    PT Home Exercise Plan Access Code: [email or web address]    Consulted and Agree with Plan of Care Patient             Patient will benefit from skilled therapeutic intervention in order to improve the following deficits and impairments:  Decreased knowledge of precautions, Postural dysfunction, Decreased range of motion  Visit Diagnosis: Aftercare following surgery for neoplasm  Abnormal posture  Malignant neoplasm of upper-outer quadrant of right breast in female, estrogen receptor positive [facility])     Problem List Patient Active Problem List   Diagnosis  Date Noted   S/P mastectomy, right 01/23/2021   Genetic testing 01/09/2021   Family history of thyroid cancer 01/02/2021   History of thyroid cancer 01/02/2021   Malignant neoplasm of upper-outer quadrant of right female breast ([place]) 12/30/2020   S/P cesarean section 05/14/2014    Stark Bray, PT 07/16/2021, 10:24 AM  [facility] @ [place], [place], [address] Phone:     Fax:     Name: Phyllis Harris MRN: [id number] Date of Birth: [birth year]

## 2021-07-20 ENCOUNTER — Ambulatory Visit: Payer: Self-pay

## 2021-07-28 ENCOUNTER — Encounter: Payer: Self-pay | Admitting: Hematology and Oncology

## 2021-07-28 ENCOUNTER — Ambulatory Visit: Payer: Medicaid Other | Admitting: Rehabilitation

## 2021-07-28 ENCOUNTER — Encounter: Payer: Self-pay | Admitting: Rehabilitation

## 2021-07-28 ENCOUNTER — Other Ambulatory Visit: Payer: Self-pay

## 2021-07-28 DIAGNOSIS — Z170 Estrogen receptor positive status [ER+]: Secondary | ICD-10-CM

## 2021-07-28 DIAGNOSIS — Z483 Aftercare following surgery for neoplasm: Secondary | ICD-10-CM

## 2021-07-28 DIAGNOSIS — R293 Abnormal posture: Secondary | ICD-10-CM

## 2021-07-28 DIAGNOSIS — C50411 Malignant neoplasm of upper-outer quadrant of right female breast: Secondary | ICD-10-CM

## 2021-07-28 NOTE — Therapy (Signed)
[facility] @ [place], [place], [address] Phone: [phone]   Fax:  [phone]  Physical Therapy Treatment  Patient Details  Name: Phyllis Harris MRN: [id number] Date of Birth: 1980/12/24 Referring Provider (PT): Dr. Donne Hazel   Encounter Date: 07/28/2021   PT End of Session - 07/28/21 0844     Visit Number 4    Number of Visits 9    Date for PT Re-Evaluation 08/27/21    PT Start Time 0800    PT Stop Time 3419    PT Time Calculation (min) 44 min    Activity Tolerance Patient tolerated treatment well    Behavior During Therapy [facility] for tasks assessed/performed             Past Medical History:  Diagnosis Date   Anxiety    Cancer ([place]) 2008   thyroid    Family history of thyroid cancer 01/02/2021   GERD (gastroesophageal reflux disease)    with pregnancy   History of chicken pox    History of radiation therapy 04/17/2021   03/04/2021-04/17/2021  right breast     Dr Gery Pray   History of thyroid cancer 01/02/2021   Hypothyroidism    PONV (postoperative nausea and vomiting)     Past Surgical History:  Procedure Laterality Date   BREAST RECONSTRUCTION WITH PLACEMENT OF TISSUE EXPANDER AND ALLODERM Right 01/23/2021   Procedure: BREAST RECONSTRUCTION WITH PLACEMENT OF TISSUE EXPANDER AND ALLODERM;  Surgeon: Irene Limbo, MD;  Location: [facility];  Service: Plastics;  Laterality: Right;   CESAREAN SECTION  2010   CESAREAN SECTION N/A 05/14/2014   Procedure: CESAREAN SECTION;  Surgeon: Cyril Mourning, MD;  Location: [facility];  Service: Obstetrics;  Laterality: N/A;   DILATION AND CURETTAGE OF UTERUS     miscarriage   MASTECTOMY W/ SENTINEL NODE BIOPSY Right 01/23/2021   Procedure: RIGHT MASTECTOMY WITH AXILLARY SENTINEL LYMPH NODE BIOPSY;  Surgeon: Rolm Bookbinder, MD;  Location: [facility];  Service: General;  Laterality: Right;   RADIOACTIVE SEED GUIDED AXILLARY SENTINEL LYMPH NODE Right  01/23/2021   Procedure: RADIOACTIVE SEED GUIDED RIGHT AXILLARY SENTINEL LYMPH NODE EXCISION;  Surgeon: Rolm Bookbinder, MD;  Location: [facility];  Service: General;  Laterality: Right;   THYROIDECTOMY      There were no vitals filed for this visit.   Subjective Assessment - 07/28/21 0801     Subjective Its ok. Just tight    Pertinent History Rt mastectomy with expander 01/23/21 with Dr Donne Hazel and Thimmappa with 2/4 nodes positive.  No chemotherapy needed, but will do doing radiation. (ended 04/17/21)    Limitations Lifting    Currently in Pain? No/denies                [facility] PT Assessment - 07/28/21 0001       AROM   Right Shoulder Flexion 155 Degrees (P)     Right Shoulder ABduction 160 Degrees (P)                            [facility] Adult PT Treatment/Exercise - 07/28/21 0001       Exercises   Exercises Shoulder      Manual Therapy   Soft tissue mobilization to the Rt pectoralis and latissimus in stretched positions and in sidelying into the latissimus with abduction MWM x 3    Passive ROM to the Rt shoulder into flexion, abduction, ER,  D2                          PT Long Term Goals - 07/16/21 1023       PT LONG TERM GOAL #1   Title Pt will have right shoulder ROM and function nearly equal to left    Time 6    Period Weeks    Status New      PT LONG TERM GOAL #2   Title Pt will be independent with final HEP    Time 6    Period Weeks    Status New                   Plan - 07/28/21 0844     Clinical Impression Statement 1st session of STM and MFR today with improvements in pectoralis tension post MT and subjectively.  WIll continue with STM/MFR to decrease Rt upper quadrant stiffness    PT Frequency 1x / week    PT Duration 6 weeks    PT Treatment/Interventions ADLs/Self Care Home Management;Therapeutic exercise;Patient/family education;Manual lymph drainage;Manual techniques    PT Next Visit Plan STM/MFR Rt upper  quadrant,  Cont every 3 month L-Dex screens for up to 2 years from SLNB.    Consulted and Agree with Plan of Care Patient             Patient will benefit from skilled therapeutic intervention in order to improve the following deficits and impairments:     Visit Diagnosis: Aftercare following surgery for neoplasm  Abnormal posture  Malignant neoplasm of upper-outer quadrant of right breast in female, estrogen receptor positive [facility])     Problem List Patient Active Problem List   Diagnosis Date Noted   S/P mastectomy, right 01/23/2021   Genetic testing 01/09/2021   Family history of thyroid cancer 01/02/2021   History of thyroid cancer 01/02/2021   Malignant neoplasm of upper-outer quadrant of right female breast ([place]) 12/30/2020   S/P cesarean section 05/14/2014    Stark Bray, PT 07/28/2021, 8:46 AM  [facility] @ [place], [place], [address] Phone: [phone]   Fax:  [phone]  Name: Phyllis Harris MRN: [id number] Date of Birth: 07-24-1981

## 2021-07-30 ENCOUNTER — Telehealth: Payer: Self-pay | Admitting: *Deleted

## 2021-07-30 ENCOUNTER — Other Ambulatory Visit: Payer: Self-pay | Admitting: Hematology and Oncology

## 2021-08-03 ENCOUNTER — Inpatient Hospital Stay: Payer: Medicaid Other | Attending: Adult Health | Admitting: Adult Health

## 2021-08-04 ENCOUNTER — Telehealth: Payer: Self-pay | Admitting: *Deleted

## 2021-08-05 ENCOUNTER — Telehealth: Payer: Self-pay | Admitting: Adult Health

## 2021-08-05 NOTE — Telephone Encounter (Signed)
Called and LMOM with patient to discuss her SCP visit and rescheduling her missed virtual visit.    Wilber Bihari, NP

## 2021-08-11 ENCOUNTER — Encounter: Payer: Medicaid Other | Admitting: Rehabilitation

## 2021-08-19 NOTE — H&P (Signed)
Subjective:     Patient ID: Phyllis Harris is a 40 y.o. female.   Follow-up     6.5 months post op right mastectomy with immediate expander based reconstruction. Scheduled for implant exchange and left breast augmentation end of month.   Presented with palpable right breast mass. Diagnostic MMG/US showed calcifications in the upper outer and upper inner right breast, 9.7cm, and multiple masses from the 9-2 o'clock position, 1.8cm, 3.3cm, 1.1cm, 2.8cm, 0.8cm, and 2 abnormal right axillary LN. Biopsies labed right breast 11 o clock, right breast 2 o clock, and LN all showed invasive mammary carcinoma with carcinoma in situ ER/PR+, Her 2-.    MRI demonstrated NME involving the upper inner and upper outer quadrants of the right breast consistent with the patient's known malignancy spanning 7.4 x 7.2 x 5.9 cm. On mammography, the suspicious calcifications spanned up to 9.7 cm. A 1.4 cm mass correlating with the patient's palpable lump involving the lateral aspect of the right nipple areolar complex with skin involvement in the areola. Two abnormal LN axillary noted, one with biopsy clip.   Staging scans negative for distant disease.   Final pathology 6.3 cm IDC with DCIS, margins clear. Metastatic carcinoma to on IM LN, 2/4 SLN positive for metastatic carcinoma, one with extranodal extension. Mammaprint low risk. Completed adjuvant RT 7.8.22. Plan tamoxifen 10 years. Recommended abemaciclib adjuvant therapy-patient declined.   Genetics negative. Mother and patient with history thyroid ca. Patient reports mother's carotid a resected with surgery, underwent latissimus dorsi reconstruction to breast.   Prior 36 B, desires C cup. 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 Lives with spouse and two kids, one of which has special needs.   Review of Systems      Objective:   Physical Exam Cardiovascular:     Rate and Rhythm: Normal rate and  regular rhythm.     Heart sounds: Normal heart sounds.  Pulmonary:     Effort: Pulmonary effort is normal.     Breath sounds: Normal breath sounds.       Chest: right chest expanded with temporary tattoo in place right chest hyperpigmentation soft SN to nipple L 27 BW L 21 cm Nipple to IMF L 8 cm Soft tissue pinch left 6 cm     Assessment:     Right breast ca UOQ ER+ S/p right SSM, SLN, prepectoral TE/ADM (Alloderm) reconstruction Adjuvant radiation    Plan:     Plan removal right chest tissue expander placement silicone implant left breast augmentation   Reviewed increased risks reconstruction in setting RT including capsular contracture, wound healing problems which can present as exposure implant. Some type of autologous tissue reconstruction post radiation can decrease risks back to baseline. Counseled options of implant exchange alone, LD flap over implant, or referral to microsurgeon to discuss other autologous options. Reviewed donor site risks including weakness, seroma. Patient declines referral and LD flap. Understands any complications from implant exchange may require removal and starting reconstruction process over in future with some type of flap.   As in prepectoral position recommend silicone implants , HCG or capacity filled implants. Reviewed saline vs silicone, shaped vs round. HCG or capacity filled silicone implants may offer reduced risk visible rippling.  Reviewed MRI or [place] surveillance for rupture with silicone implants. Reviewed examples for 4th generation, capacity filled 4th generation, and HCG implants vs saline implants. Patient has elected for silicone plan smooth  round.   Reviewed possible fat grafting. Reviewed purpose of this to thicken flaps limit visible rippling and aid in contour. Reviewed donor site pain, need for compression, variable take graft, fat necrosis that presents as masses and may require work up. Patient declines.   Over left breast plan  augmentation. Patient desires overall larger volume than present in left breast and plan dual plane augmentation. Reviewed this may allow more upper pole fullness to match right reconstruction but overall will be asymmetric shape without bra and over time left breast tissue will descend over implant and may require mastopexy. Completed in office sizing on left again today and liked 200 ml.   Completed [facility] physician patient checklist.    Rx for oxycodone robaxin and Bactrim given   Natrelle 133S FV-12-T 400 ml tissue expander placed,  fill volume 410 ml saline

## 2021-08-24 ENCOUNTER — Telehealth: Payer: Self-pay

## 2021-08-24 ENCOUNTER — Ambulatory Visit: Payer: Medicaid Other | Attending: General Surgery | Admitting: Rehabilitation

## 2021-08-24 ENCOUNTER — Other Ambulatory Visit: Payer: Self-pay | Admitting: Hematology and Oncology

## 2021-08-24 DIAGNOSIS — C50411 Malignant neoplasm of upper-outer quadrant of right female breast: Secondary | ICD-10-CM | POA: Insufficient documentation

## 2021-08-24 DIAGNOSIS — Z170 Estrogen receptor positive status [ER+]: Secondary | ICD-10-CM | POA: Insufficient documentation

## 2021-08-24 DIAGNOSIS — R293 Abnormal posture: Secondary | ICD-10-CM | POA: Insufficient documentation

## 2021-08-24 DIAGNOSIS — Z483 Aftercare following surgery for neoplasm: Secondary | ICD-10-CM | POA: Insufficient documentation

## 2021-08-24 NOTE — Telephone Encounter (Signed)
error 

## 2021-08-28 ENCOUNTER — Encounter (HOSPITAL_BASED_OUTPATIENT_CLINIC_OR_DEPARTMENT_OTHER): Payer: Self-pay | Admitting: Plastic Surgery

## 2021-09-02 NOTE — Progress Notes (Addendum)

## 2021-09-07 ENCOUNTER — Ambulatory Visit (HOSPITAL_BASED_OUTPATIENT_CLINIC_OR_DEPARTMENT_OTHER)
Admission: RE | Admit: 2021-09-07 | Discharge: 2021-09-07 | Disposition: A | Discharge status: Home / Self Care | Payer: Medicaid Other | Source: Ambulatory Visit | Attending: Plastic Surgery | Admitting: Plastic Surgery

## 2021-09-07 ENCOUNTER — Encounter (HOSPITAL_BASED_OUTPATIENT_CLINIC_OR_DEPARTMENT_OTHER): Payer: Self-pay | Admitting: Plastic Surgery

## 2021-09-07 ENCOUNTER — Other Ambulatory Visit: Payer: Self-pay

## 2021-09-07 ENCOUNTER — Ambulatory Visit (HOSPITAL_BASED_OUTPATIENT_CLINIC_OR_DEPARTMENT_OTHER): Payer: Medicaid Other | Admitting: Anesthesiology

## 2021-09-07 ENCOUNTER — Encounter (HOSPITAL_BASED_OUTPATIENT_CLINIC_OR_DEPARTMENT_OTHER)
Admission: RE | Disposition: A | Discharge status: Home / Self Care | Payer: Self-pay | Source: Ambulatory Visit | Attending: Plastic Surgery

## 2021-09-07 DIAGNOSIS — Z6833 Body mass index (BMI) 33.0-33.9, adult: Secondary | ICD-10-CM | POA: Diagnosis not present

## 2021-09-07 DIAGNOSIS — Z923 Personal history of irradiation: Secondary | ICD-10-CM | POA: Insufficient documentation

## 2021-09-07 DIAGNOSIS — Z808 Family history of malignant neoplasm of other organs or systems: Secondary | ICD-10-CM | POA: Insufficient documentation

## 2021-09-07 DIAGNOSIS — E669 Obesity, unspecified: Secondary | ICD-10-CM | POA: Insufficient documentation

## 2021-09-07 DIAGNOSIS — Z421 Encounter for breast reconstruction following mastectomy: Secondary | ICD-10-CM | POA: Insufficient documentation

## 2021-09-07 DIAGNOSIS — Z853 Personal history of malignant neoplasm of breast: Secondary | ICD-10-CM | POA: Insufficient documentation

## 2021-09-07 HISTORY — PX: REMOVAL OF TISSUE EXPANDER AND PLACEMENT OF IMPLANT: SHX6457

## 2021-09-07 HISTORY — PX: AUGMENTATION MAMMAPLASTY: SUR837

## 2021-09-07 HISTORY — PX: BREAST ENHANCEMENT SURGERY: SHX7

## 2021-09-07 LAB — POCT PREGNANCY, URINE: Preg Test, Ur: NEGATIVE

## 2021-09-07 SURGERY — REMOVAL, TISSUE EXPANDER, BREAST, WITH IMPLANT INSERTION
Anesthesia: General | Site: Chest | Laterality: Right

## 2021-09-07 MED ORDER — EPINEPHRINE PF 1 MG/ML IJ SOLN
INTRAMUSCULAR | Status: AC
  Filled 2021-09-07: qty 1

## 2021-09-07 MED ORDER — FENTANYL CITRATE (PF) 100 MCG/2ML IJ SOLN
INTRAMUSCULAR | Status: AC
  Filled 2021-09-07: qty 2

## 2021-09-07 MED ORDER — PROPOFOL 10 MG/ML IV BOLUS
INTRAVENOUS | Status: DC | PRN
  Administered 2021-09-07: 150 mg via INTRAVENOUS
  Administered 2021-09-07: 50 mg via INTRAVENOUS

## 2021-09-07 MED ORDER — GABAPENTIN 300 MG PO CAPS
ORAL_CAPSULE | ORAL | Status: AC
  Filled 2021-09-07: qty 1

## 2021-09-07 MED ORDER — SCOPOLAMINE 1 MG/3DAYS TD PT72
1.0000 | MEDICATED_PATCH | TRANSDERMAL | Status: DC
  Administered 2021-09-07: 07:00:00 1.5 mg via TRANSDERMAL

## 2021-09-07 MED ORDER — SODIUM BICARBONATE 4.2 % IV SOLN
INTRAVENOUS | Status: AC
  Filled 2021-09-07: qty 20

## 2021-09-07 MED ORDER — CELECOXIB 200 MG PO CAPS
200.0000 mg | ORAL_CAPSULE | ORAL | Status: AC
  Administered 2021-09-07: 06:00:00 200 mg via ORAL

## 2021-09-07 MED ORDER — LIDOCAINE HCL (CARDIAC) PF 100 MG/5ML IV SOSY
PREFILLED_SYRINGE | INTRAVENOUS | Status: DC | PRN
  Administered 2021-09-07: 100 mg via INTRAVENOUS

## 2021-09-07 MED ORDER — CELECOXIB 200 MG PO CAPS
ORAL_CAPSULE | ORAL | Status: AC
  Filled 2021-09-07: qty 1

## 2021-09-07 MED ORDER — CHLORHEXIDINE GLUCONATE CLOTH 2 % EX PADS: 6.0000 | MEDICATED_PAD | Freq: Once | CUTANEOUS | Status: DC

## 2021-09-07 MED ORDER — CEFAZOLIN SODIUM-DEXTROSE 2-4 GM/100ML-% IV SOLN
INTRAVENOUS | Status: AC
  Filled 2021-09-07: qty 100

## 2021-09-07 MED ORDER — ACETAMINOPHEN 500 MG PO TABS
1000.0000 mg | ORAL_TABLET | ORAL | Status: AC
  Administered 2021-09-07: 06:00:00 1000 mg via ORAL

## 2021-09-07 MED ORDER — PROMETHAZINE HCL 25 MG/ML IJ SOLN: 6.2500 mg | INTRAMUSCULAR | Status: DC | PRN

## 2021-09-07 MED ORDER — LIDOCAINE HCL (PF) 1 % IJ SOLN
INTRAMUSCULAR | Status: AC
  Filled 2021-09-07: qty 60

## 2021-09-07 MED ORDER — BUPIVACAINE-EPINEPHRINE (PF) 0.25% -1:200000 IJ SOLN
INTRAMUSCULAR | Status: AC
  Filled 2021-09-07: qty 30

## 2021-09-07 MED ORDER — PROPOFOL 10 MG/ML IV BOLUS
INTRAVENOUS | Status: AC
  Filled 2021-09-07: qty 20

## 2021-09-07 MED ORDER — OXYCODONE HCL 5 MG/5ML PO SOLN: 5.0000 mg | Freq: Once | ORAL | Status: DC | PRN

## 2021-09-07 MED ORDER — BUPIVACAINE-EPINEPHRINE 0.25% -1:200000 IJ SOLN
INTRAMUSCULAR | Status: DC | PRN
  Administered 2021-09-07: 30 mL

## 2021-09-07 MED ORDER — ACETAMINOPHEN 500 MG PO TABS
ORAL_TABLET | ORAL | Status: AC
  Filled 2021-09-07: qty 2

## 2021-09-07 MED ORDER — SCOPOLAMINE 1 MG/3DAYS TD PT72
MEDICATED_PATCH | TRANSDERMAL | Status: AC
  Filled 2021-09-07: qty 1

## 2021-09-07 MED ORDER — LIDOCAINE-EPINEPHRINE (PF) 1 %-1:200000 IJ SOLN
INTRAMUSCULAR | Status: AC
  Filled 2021-09-07: qty 30

## 2021-09-07 MED ORDER — APREPITANT 40 MG PO CAPS
ORAL_CAPSULE | ORAL | Status: AC
  Filled 2021-09-07: qty 1

## 2021-09-07 MED ORDER — BUPIVACAINE HCL (PF) 0.25 % IJ SOLN
INTRAMUSCULAR | Status: AC
  Filled 2021-09-07: qty 30

## 2021-09-07 MED ORDER — SUGAMMADEX SODIUM 200 MG/2ML IV SOLN
INTRAVENOUS | Status: DC | PRN
  Administered 2021-09-07: 200 mg via INTRAVENOUS

## 2021-09-07 MED ORDER — PROPOFOL 500 MG/50ML IV EMUL
INTRAVENOUS | Status: DC | PRN
  Administered 2021-09-07: 50 ug/kg/min via INTRAVENOUS

## 2021-09-07 MED ORDER — LACTATED RINGERS IV SOLN: INTRAVENOUS | Status: DC

## 2021-09-07 MED ORDER — LIDOCAINE 2% (20 MG/ML) 5 ML SYRINGE
INTRAMUSCULAR | Status: AC
  Filled 2021-09-07: qty 5

## 2021-09-07 MED ORDER — CEFAZOLIN SODIUM-DEXTROSE 2-4 GM/100ML-% IV SOLN
2.0000 g | INTRAVENOUS | Status: AC
  Administered 2021-09-07: 08:00:00 2 g via INTRAVENOUS

## 2021-09-07 MED ORDER — FENTANYL CITRATE (PF) 100 MCG/2ML IJ SOLN
INTRAMUSCULAR | Status: DC | PRN
  Administered 2021-09-07: 25 ug via INTRAVENOUS
  Administered 2021-09-07: 100 ug via INTRAVENOUS
  Administered 2021-09-07 (×2): 50 ug via INTRAVENOUS
  Administered 2021-09-07: 25 ug via INTRAVENOUS
  Administered 2021-09-07: 50 ug via INTRAVENOUS

## 2021-09-07 MED ORDER — MIDAZOLAM HCL 2 MG/2ML IJ SOLN
INTRAMUSCULAR | Status: AC
  Filled 2021-09-07: qty 2

## 2021-09-07 MED ORDER — DROPERIDOL 2.5 MG/ML IJ SOLN
INTRAMUSCULAR | Status: DC | PRN
  Administered 2021-09-07: .625 mg via INTRAVENOUS

## 2021-09-07 MED ORDER — SODIUM CHLORIDE 0.9 % IV SOLN
INTRAVENOUS | Status: AC
  Filled 2021-09-07: qty 10

## 2021-09-07 MED ORDER — MEPERIDINE HCL 25 MG/ML IJ SOLN: 6.2500 mg | INTRAMUSCULAR | Status: DC | PRN

## 2021-09-07 MED ORDER — OXYCODONE HCL 5 MG PO TABS: 5.0000 mg | ORAL_TABLET | Freq: Once | ORAL | Status: DC | PRN

## 2021-09-07 MED ORDER — MIDAZOLAM HCL 5 MG/5ML IJ SOLN
INTRAMUSCULAR | Status: DC | PRN
  Administered 2021-09-07: 2 mg via INTRAVENOUS

## 2021-09-07 MED ORDER — ROCURONIUM BROMIDE 10 MG/ML (PF) SYRINGE
PREFILLED_SYRINGE | INTRAVENOUS | Status: AC
  Filled 2021-09-07: qty 10

## 2021-09-07 MED ORDER — DROPERIDOL 2.5 MG/ML IJ SOLN
INTRAMUSCULAR | Status: AC
  Filled 2021-09-07: qty 2

## 2021-09-07 MED ORDER — GABAPENTIN 300 MG PO CAPS
300.0000 mg | ORAL_CAPSULE | ORAL | Status: AC
  Administered 2021-09-07: 06:00:00 300 mg via ORAL

## 2021-09-07 MED ORDER — PROPOFOL 500 MG/50ML IV EMUL
INTRAVENOUS | Status: AC
  Filled 2021-09-07: qty 50

## 2021-09-07 MED ORDER — HYDROMORPHONE HCL 1 MG/ML IJ SOLN
0.2500 mg | INTRAMUSCULAR | Status: DC | PRN
Start: 2021-09-07 — End: 2021-09-07

## 2021-09-07 MED ORDER — APREPITANT 40 MG PO CAPS
40.0000 mg | ORAL_CAPSULE | Freq: Once | ORAL | Status: AC
  Administered 2021-09-07: 07:00:00 40 mg via ORAL

## 2021-09-07 MED ORDER — ONDANSETRON HCL 4 MG/2ML IJ SOLN
INTRAMUSCULAR | Status: AC
  Filled 2021-09-07: qty 2

## 2021-09-07 MED ORDER — ROCURONIUM BROMIDE 100 MG/10ML IV SOLN
INTRAVENOUS | Status: DC | PRN
  Administered 2021-09-07 (×2): 30 mg via INTRAVENOUS
  Administered 2021-09-07: 100 mg via INTRAVENOUS

## 2021-09-07 MED ORDER — SODIUM CHLORIDE 0.9 % IV SOLN: INTRAVENOUS | Status: DC | PRN

## 2021-09-07 MED ORDER — 0.9 % SODIUM CHLORIDE (POUR BTL) OPTIME
TOPICAL | Status: DC | PRN
  Administered 2021-09-07: 08:00:00 1000 mL

## 2021-09-07 MED ORDER — ONDANSETRON HCL 4 MG/2ML IJ SOLN
INTRAMUSCULAR | Status: DC | PRN
  Administered 2021-09-07: 4 mg via INTRAVENOUS

## 2021-09-07 MED ORDER — DEXAMETHASONE SODIUM PHOSPHATE 10 MG/ML IJ SOLN
INTRAMUSCULAR | Status: AC
  Filled 2021-09-07: qty 1

## 2021-09-07 MED ORDER — DEXAMETHASONE SODIUM PHOSPHATE 4 MG/ML IJ SOLN
INTRAMUSCULAR | Status: DC | PRN
  Administered 2021-09-07: 10 mg via INTRAVENOUS

## 2021-09-07 SURGICAL SUPPLY — 99 items
ADH SKN CLS APL DERMABOND .7 (GAUZE/BANDAGES/DRESSINGS) ×4
APL PRP STRL LF DISP 70% ISPRP (MISCELLANEOUS) ×4
BAG DECANTER FOR FLEXI CONT (MISCELLANEOUS) ×3 IMPLANT
BINDER ABDOMINAL 10 UNV 27-48 (MISCELLANEOUS) IMPLANT
BINDER ABDOMINAL 12 SM 30-45 (SOFTGOODS) IMPLANT
BINDER BREAST LRG (GAUZE/BANDAGES/DRESSINGS) IMPLANT
BINDER BREAST MEDIUM (GAUZE/BANDAGES/DRESSINGS) IMPLANT
BINDER BREAST XLRG (GAUZE/BANDAGES/DRESSINGS) ×1 IMPLANT
BINDER BREAST XXLRG (GAUZE/BANDAGES/DRESSINGS) IMPLANT
BLADE SURG 10 STRL SS (BLADE) ×4 IMPLANT
BLADE SURG 11 STRL SS (BLADE) ×3 IMPLANT
BLADE SURG 15 STRL LF DISP TIS (BLADE) ×2 IMPLANT
BLADE SURG 15 STRL SS (BLADE) ×3
BNDG ELASTIC 6X5.8 VLCR STR LF (GAUZE/BANDAGES/DRESSINGS) IMPLANT
BNDG GAUZE ELAST 4 BULKY (GAUZE/BANDAGES/DRESSINGS) ×6 IMPLANT
CANISTER LIPO FAT HARVEST (MISCELLANEOUS) ×2 IMPLANT
CANISTER SUCT 1200ML W/VALVE (MISCELLANEOUS) ×3 IMPLANT
CHLORAPREP W/TINT 26 (MISCELLANEOUS) ×4 IMPLANT
COVER BACK TABLE 60X90IN (DRAPES) ×3 IMPLANT
COVER MAYO STAND STRL (DRAPES) ×5 IMPLANT
DECANTER SPIKE VIAL GLASS SM (MISCELLANEOUS) ×2 IMPLANT
DERMABOND ADVANCED (GAUZE/BANDAGES/DRESSINGS) ×2
DERMABOND ADVANCED .7 DNX12 (GAUZE/BANDAGES/DRESSINGS) ×4 IMPLANT
DRAIN CHANNEL 15F RND FF W/TCR (WOUND CARE) IMPLANT
DRAPE TOP ARMCOVERS (MISCELLANEOUS) ×3 IMPLANT
DRAPE U-SHAPE 76X120 STRL (DRAPES) ×3 IMPLANT
DRAPE UTILITY XL STRL (DRAPES) ×4 IMPLANT
DRSG PAD ABDOMINAL 8X10 ST (GAUZE/BANDAGES/DRESSINGS) ×6 IMPLANT
DRSG TEGADERM 2-3/8X2-3/4 SM (GAUZE/BANDAGES/DRESSINGS) ×1 IMPLANT
ELECT BLADE 4.0 EZ CLEAN MEGAD (MISCELLANEOUS) ×3
ELECT BLADE 6.5 EXT (BLADE) IMPLANT
ELECT COATED BLADE 2.86 ST (ELECTRODE) ×3 IMPLANT
ELECT REM PT RETURN 9FT ADLT (ELECTROSURGICAL) ×3
ELECTRODE BLDE 4.0 EZ CLN MEGD (MISCELLANEOUS) ×2 IMPLANT
ELECTRODE REM PT RTRN 9FT ADLT (ELECTROSURGICAL) ×2 IMPLANT
EVACUATOR SILICONE 100CC (DRAIN) IMPLANT
GLOVE SURG HYDRASOFT LTX SZ5.5 (GLOVE) ×5 IMPLANT
GOWN STRL REUS W/ TWL LRG LVL3 (GOWN DISPOSABLE) ×4 IMPLANT
GOWN STRL REUS W/TWL LRG LVL3 (GOWN DISPOSABLE) ×6
IMPL BREAST 190CC (Breast) IMPLANT
IMPL BREAST P6.5XRND LO 650 (Breast) IMPLANT
IMPL BRST P6.5XRND LO 650CC (Breast) ×2 IMPLANT
IMPLANT BREAST 190CC (Breast) ×3 IMPLANT
IMPLANT BREAST GEL 650CC (Breast) ×3 IMPLANT
IV NS 1000ML (IV SOLUTION)
IV NS 1000ML BAXH (IV SOLUTION) IMPLANT
IV NS 500ML (IV SOLUTION)
IV NS 500ML BAXH (IV SOLUTION) ×2 IMPLANT
KIT FILL SYSTEM UNIVERSAL (SET/KITS/TRAYS/PACK) IMPLANT
LINER CANISTER 1000CC FLEX (MISCELLANEOUS) ×2 IMPLANT
MARKER SKIN DUAL TIP RULER LAB (MISCELLANEOUS) IMPLANT
NDL FILTER BLUNT 18X1 1/2 (NEEDLE) IMPLANT
NDL HYPO 25X1 1.5 SAFETY (NEEDLE) ×2 IMPLANT
NDL SAFETY ECLIPSE 18X1.5 (NEEDLE) ×2 IMPLANT
NEEDLE FILTER BLUNT 18X 1/2SAF (NEEDLE) ×1
NEEDLE FILTER BLUNT 18X1 1/2 (NEEDLE) ×2 IMPLANT
NEEDLE HYPO 18GX1.5 SHARP (NEEDLE) ×3
NEEDLE HYPO 25X1 1.5 SAFETY (NEEDLE) ×3 IMPLANT
NS IRRIG 1000ML POUR BTL (IV SOLUTION) ×3 IMPLANT
PACK BASIN DAY SURGERY FS (CUSTOM PROCEDURE TRAY) ×3 IMPLANT
PAD ALCOHOL SWAB (MISCELLANEOUS) ×2 IMPLANT
PENCIL SMOKE EVACUATOR (MISCELLANEOUS) ×3 IMPLANT
PIN SAFETY STERILE (MISCELLANEOUS) IMPLANT
SHEET MEDIUM DRAPE 40X70 STRL (DRAPES) ×5 IMPLANT
SIZER BREAST 190CC (SIZER) ×3
SIZER BREAST 205CC (SIZER) ×3
SIZER BREAST 650CC SNGL USE (SIZER) ×3
SIZER BRST 190CC (SIZER) IMPLANT
SIZER BRST 205CC (SIZER) IMPLANT
SIZER BRST 650CC SNGL USE (SIZER) IMPLANT
SLEEVE SCD COMPRESS KNEE MED (STOCKING) ×3 IMPLANT
SPONGE T-LAP 18X18 ~~LOC~~+RFID (SPONGE) ×6 IMPLANT
STAPLER VISISTAT 35W (STAPLE) ×3 IMPLANT
STRIP CLOSURE SKIN 1/2X4 (GAUZE/BANDAGES/DRESSINGS) ×3 IMPLANT
SUT ETHILON 2 0 FS 18 (SUTURE) IMPLANT
SUT MNCRL AB 4-0 PS2 18 (SUTURE) ×4 IMPLANT
SUT PDS AB 2-0 CT2 27 (SUTURE) ×1 IMPLANT
SUT SILK 2 0 SH (SUTURE) IMPLANT
SUT VIC AB 3-0 PS1 18 (SUTURE)
SUT VIC AB 3-0 PS1 18XBRD (SUTURE) IMPLANT
SUT VIC AB 3-0 SH 27 (SUTURE) ×3
SUT VIC AB 3-0 SH 27X BRD (SUTURE) ×2 IMPLANT
SUT VICRYL 0 CT-2 (SUTURE) IMPLANT
SUT VICRYL 4-0 PS2 18IN ABS (SUTURE) ×3 IMPLANT
SUT VLOC 180 0 24IN GS25 (SUTURE) IMPLANT
SYR 10ML LL (SYRINGE) ×6 IMPLANT
SYR 20ML LL LF (SYRINGE) IMPLANT
SYR 50ML LL SCALE MARK (SYRINGE) ×2 IMPLANT
SYR BULB IRRIG 60ML STRL (SYRINGE) ×5 IMPLANT
SYR CONTROL 10ML LL (SYRINGE) ×3 IMPLANT
SYR TB 1ML LL NO SAFETY (SYRINGE) ×2 IMPLANT
SYR TOOMEY IRRIG 70ML (MISCELLANEOUS)
SYRINGE TOOMEY IRRIG 70ML (MISCELLANEOUS) IMPLANT
TOWEL GREEN STERILE FF (TOWEL DISPOSABLE) ×6 IMPLANT
TUBE CONNECTING 20X1/4 (TUBING) ×5 IMPLANT
TUBING INFILTRATION IT-10001 (TUBING) ×2 IMPLANT
TUBING SET GRADUATE ASPIR 12FT (MISCELLANEOUS) ×2 IMPLANT
UNDERPAD 30X36 HEAVY ABSORB (UNDERPADS AND DIAPERS) ×6 IMPLANT
YANKAUER SUCT BULB TIP NO VENT (SUCTIONS) ×3 IMPLANT

## 2021-09-07 NOTE — Transfer of Care (Signed)
Immediate Anesthesia Transfer of Care Note  Patient: SAVI LASTINGER  Procedure(s) Performed: REMOVAL OF RIGHT CHEST TISSUE EXPANDER AND PLACEMENT OF SILICONE IMPLANT (Right: Chest) MAMMOPLASTY AUGMENTATION (BREAST) with placement of silicone implant (Left: Breast)  Patient Location: PACU  Anesthesia Type:General  Level of Consciousness: drowsy, patient cooperative and responds to stimulation  Airway & Oxygen Therapy: Patient Spontanous Breathing and Patient connected to face mask oxygen  Post-op Assessment: Report given to RN and Post -op Vital signs reviewed and stable  Post vital signs: Reviewed and stable  Last Vitals:  Vitals Value Taken Time  BP    Temp    Pulse 84 09/07/21 0943  Resp 10 09/07/21 0943  SpO2 98 % 09/07/21 0943  Vitals shown include unvalidated device data.  Last Pain:  Vitals:   09/07/21 0623  TempSrc: Oral  PainSc: 0-No pain         Complications: No notable events documented.

## 2021-09-07 NOTE — Interval H&P Note (Signed)
History and Physical Interval Note:  09/07/2021 6:55 AM  Phyllis Harris  has presented today for surgery, with the diagnosis of history right breast cancer, acquired absence breast, history therapeutic radiation.  The various methods of treatment have been discussed with the patient and family. After consideration of risks, benefits and other options for treatment, the patient has consented to  Procedure(s): REMOVAL OF RIGHT CHEST TISSUE EXPANDER AND PLACEMENT OF SILICONE IMPLANT (Right) MAMMOPLASTY AUGMENTATION (BREAST) (Left) POSSIBLE LIPOFILLING TO RIGHT CHEST (Right) as a surgical intervention.  The patient's history has been reviewed, patient examined, no change in status, stable for surgery.  I have reviewed the patient's chart and labs.  Questions were answered to the patient's satisfaction.     Arnoldo Hooker [place]

## 2021-09-07 NOTE — Anesthesia Preprocedure Evaluation (Addendum)
Anesthesia Evaluation  Patient identified by MRN, date of birth, ID band Patient awake    Reviewed: Allergy & Precautions, NPO status , Patient's Chart, lab work & pertinent test results  History of Anesthesia Complications (+) PONV  Airway Mallampati: I  TM Distance: >3 FB Neck ROM: Full    Dental no notable dental hx. (+) Teeth Intact, Dental Advisory Given   Pulmonary neg pulmonary ROS,    Pulmonary exam normal breath sounds clear to auscultation       Cardiovascular negative cardio ROS Normal cardiovascular exam Rhythm:Regular Rate:Normal     Neuro/Psych PSYCHIATRIC DISORDERS Anxiety negative neurological ROS     GI/Hepatic Neg liver ROS, GERD  Medicated and Controlled,  Endo/Other  Hypothyroidism   Renal/GU negative Renal ROS  negative genitourinary   Musculoskeletal negative musculoskeletal ROS (+)   Abdominal (+) + obese,   Peds  Hematology negative hematology ROS (+)   Anesthesia Other Findings   Reproductive/Obstetrics                             Anesthesia Physical  Anesthesia Plan  ASA: 2  Anesthesia Plan: General   Post-op Pain Management:  Regional for Post-op pain   Induction: Intravenous  PONV Risk Score and Plan: 4 or greater and Midazolam, Dexamethasone, Ondansetron, Droperidol, Scopolamine patch - Pre-op and Aprepitant  Airway Management Planned: Oral ETT  Additional Equipment:   Intra-op Plan:   Post-operative Plan: Extubation in OR  Informed Consent: I have reviewed the patients History and Physical, chart, labs and discussed the procedure including the risks, benefits and alternatives for the proposed anesthesia with the patient or authorized representative who has indicated his/her understanding and acceptance.     Dental advisory given  Plan Discussed with: CRNA  Anesthesia Plan Comments:        Anesthesia Quick Evaluation

## 2021-09-07 NOTE — Anesthesia Postprocedure Evaluation (Signed)
Anesthesia Post Note  Patient: Phyllis Harris  Procedure(s) Performed: REMOVAL OF RIGHT CHEST TISSUE EXPANDER AND PLACEMENT OF SILICONE IMPLANT (Right: Chest) MAMMOPLASTY AUGMENTATION (BREAST) with placement of silicone implant (Left: Breast)     Patient location during evaluation: PACU Anesthesia Type: General Level of consciousness: awake and alert Pain management: pain level controlled Vital Signs Assessment: post-procedure vital signs reviewed and stable Respiratory status: spontaneous breathing, nonlabored ventilation and respiratory function stable Cardiovascular status: blood pressure returned to baseline and stable Postop Assessment: no apparent nausea or vomiting Anesthetic complications: no   No notable events documented.  Last Vitals:  Vitals:   09/07/21 1000 09/07/21 1038  BP: 125/71 125/81  Pulse: 75 77  Resp: 17 18  Temp:  36.7 C  SpO2: 95% 98%    Last Pain:  Vitals:   09/07/21 1015  TempSrc:   PainSc: 0-No pain                 Lynda Rainwater

## 2021-09-07 NOTE — Anesthesia Procedure Notes (Signed)
Procedure Name: Intubation Date/Time: 09/07/2021 7:33 AM Performed by: Glory Buff, CRNA Pre-anesthesia Checklist: Patient identified, Emergency Drugs available, Suction available and Patient being monitored Patient Re-evaluated:Patient Re-evaluated prior to induction Oxygen Delivery Method: Circle system utilized Preoxygenation: Pre-oxygenation with 100% oxygen Induction Type: IV induction Ventilation: Mask ventilation without difficulty Laryngoscope Size: Miller and 3 Grade View: Grade I Tube type: Oral Tube size: 7.0 mm Number of attempts: 1 Airway Equipment and Method: Stylet and Oral airway Placement Confirmation: ETT inserted through vocal cords under direct vision, positive ETCO2 and breath sounds checked- equal and bilateral Secured at: 21 cm Tube secured with: Tape Dental Injury: Teeth and Oropharynx as per pre-operative assessment

## 2021-09-07 NOTE — Discharge Instructions (Signed)

## 2021-09-07 NOTE — Op Note (Signed)
Operative Note   DATE OF OPERATION: 11.28.22  LOCATION: [facility]-outpatient  SURGICAL DIVISION: Plastic Surgery  PREOPERATIVE DIAGNOSES:  1. History breast cancer 2. Acquired absence breast 3. History therapeutic radiation  POSTOPERATIVE DIAGNOSES:  same  PROCEDURE:  1. Removal right chest tissue expander and placement silicone implant 2. Left breast augmentation with silicone implant  SURGEON: Irene Limbo MD MBA  ASSISTANT: none  ANESTHESIA:  General.   EBL: 10 ml  COMPLICATIONS: None immediate.   INDICATIONS FOR PROCEDURE:  The patient, Phyllis Harris, is a 40 y.o. female born on [birth year], is here for staged breast reconstruction following right mastectomy with prepectoral tissue expander acellular dermis reconstruction.   FINDINGS: Complete incorporation ADM noted. Natrelle Soft Touch Smooth Round implants placed bilateral. RIGHT Extra Profile  650 ml REF [id number] [id number] LEFT Low Plus Profile 190 ml REF SSLP-190 [id number]  DESCRIPTION OF PROCEDURE:  The patient's operative site was marked with the patient in the preoperative area. The patient was taken to the operating room. SCDs were placed and IV antibiotics were given. The patient's operative site was prepped and draped in a sterile fashion. A time out was performed and all information was confirmed to be correct. Incision made in right inframammary fold scar and carried through superficial fascia and acellular dermis. Expander removed. Capsulotomy performed circumferentially. Additional scoring anterior capsule completed. Sizer placed. Incision made in left inframammary fold and carried through superficial fascia to caudal border pectoralis muscle. Submuscular dissection completed to dimensions implant. Breast fascia elevated off anterior surface pectoralis muscle for 3-4 cm. Inframammary fold stabilized with interrupted 2-0 PDS suture from superficial fascia of caudal incision to chest wall. Sizer placed.  Patient brought to upright sitting position. A 650 ml implant selected for right chest and 190 ml implant selected for left chest. Patient returned to supine position. Each cavity irrigated with saline solution containing Ancef, gentamicin, and Betadine. Over left breast, local anesthetic infiltrated. Hemostasis ensured. Implant placed in cavity. Orientation implant ensured. Closure completed with 3-0 vicryl for approximation superficial fascia, 4-0 vicryl in dermis, 4-0 monocryl subcuticular closure.    Over right chest, local anesthetic infiltrated. Hemostasis ensured. Implant placed in cavity. Orientation of implant ensured. Closure completed with 3-0 vicryl for approximation superficial fascia, 4-0 vicryl in dermis, 4-0 monocryl subcuticular closure. Dermabond applied to incisions followed by dry dressing and breast binder.   The patient was allowed to wake from anesthesia, extubated and taken to the recovery room in satisfactory condition.   SPECIMENS: none  DRAINS: none

## 2021-09-08 ENCOUNTER — Encounter (HOSPITAL_BASED_OUTPATIENT_CLINIC_OR_DEPARTMENT_OTHER): Payer: Self-pay | Admitting: Plastic Surgery

## 2021-09-20 ENCOUNTER — Other Ambulatory Visit: Payer: Self-pay | Admitting: Hematology and Oncology

## 2021-09-29 ENCOUNTER — Telehealth: Payer: Self-pay | Admitting: *Deleted

## 2021-10-09 ENCOUNTER — Encounter: Payer: Self-pay | Admitting: *Deleted

## 2021-10-09 ENCOUNTER — Inpatient Hospital Stay: Payer: Medicaid Other | Attending: Adult Health | Admitting: *Deleted

## 2021-10-09 ENCOUNTER — Other Ambulatory Visit: Payer: Self-pay

## 2021-10-09 VITALS — BP 131/60 | HR 73 | Temp 97.9°F | Resp 18 | Ht 68.0 in | Wt 210.2 lb

## 2021-10-09 DIAGNOSIS — C50411 Malignant neoplasm of upper-outer quadrant of right female breast: Secondary | ICD-10-CM

## 2021-10-09 DIAGNOSIS — Z170 Estrogen receptor positive status [ER+]: Secondary | ICD-10-CM

## 2021-10-09 NOTE — Progress Notes (Signed)
°  SCP reviewed and completed. SDOH assessed and completed. Pt is recovering from right breast reconstruction surgery. She said she has some soreness and discomfort but no pain.Pt has been released from Dr. Iran Planas to start exercising again, so she is walking her new dog every evening. Pt is doing well on the tamoxifen. She does have hotflashes, vaginal wetness and a little hair thinning. Vital signs at today's visit were normal. Pt says overall she feels good. I will schedule a mammogram for left breast for March,2023.

## 2021-10-15 ENCOUNTER — Other Ambulatory Visit: Payer: Self-pay | Admitting: Hematology and Oncology

## 2021-10-21 ENCOUNTER — Encounter: Payer: Self-pay | Admitting: Rehabilitation

## 2021-10-21 ENCOUNTER — Ambulatory Visit: Payer: Commercial Managed Care - PPO | Attending: General Surgery | Admitting: Rehabilitation

## 2021-10-21 ENCOUNTER — Other Ambulatory Visit: Payer: Self-pay

## 2021-10-21 DIAGNOSIS — Z170 Estrogen receptor positive status [ER+]: Secondary | ICD-10-CM | POA: Insufficient documentation

## 2021-10-21 DIAGNOSIS — Z483 Aftercare following surgery for neoplasm: Secondary | ICD-10-CM | POA: Diagnosis not present

## 2021-10-21 DIAGNOSIS — R293 Abnormal posture: Secondary | ICD-10-CM

## 2021-10-21 DIAGNOSIS — C50411 Malignant neoplasm of upper-outer quadrant of right female breast: Secondary | ICD-10-CM | POA: Diagnosis present

## 2021-10-21 NOTE — Therapy (Signed)
[facility] @ [facility] [place], [place], [address] Phone: [phone]   Fax:  [phone]  Physical Therapy Treatment  Patient Details  Name: Phyllis Harris MRN: [id number] Date of Birth: [birth year] Referring Provider (PT): Dr. Donne Hazel   Encounter Date: 10/21/2021   PT End of Session - 10/21/21 1354     Visit Number 5    Number of Visits 9    Date for PT Re-Evaluation 12/16/21    PT Start Time 1301    PT Stop Time 1352    PT Time Calculation (min) 51 min    Activity Tolerance Patient tolerated treatment well    Behavior During Therapy [facility] for tasks assessed/performed             Past Medical History:  Diagnosis Date   Anxiety    Cancer ([place]) 2008   thyroid    Family history of thyroid cancer 01/02/2021   GERD (gastroesophageal reflux disease)    with pregnancy   History of chicken pox    History of radiation therapy 04/17/2021   03/04/2021-04/17/2021  right breast     Dr Gery Pray   History of thyroid cancer 01/02/2021   Hypothyroidism    PONV (postoperative nausea and vomiting)     Past Surgical History:  Procedure Laterality Date   BREAST ENHANCEMENT SURGERY Left 09/07/2021   Procedure: MAMMOPLASTY AUGMENTATION (BREAST) with placement of silicone implant;  Surgeon: Irene Limbo, MD;  Location: [place];  Service: Plastics;  Laterality: Left;   BREAST RECONSTRUCTION WITH PLACEMENT OF TISSUE EXPANDER AND ALLODERM Right 01/23/2021   Procedure: BREAST RECONSTRUCTION WITH PLACEMENT OF TISSUE EXPANDER AND ALLODERM;  Surgeon: Irene Limbo, MD;  Location: [place];  Service: Plastics;  Laterality: Right;   CESAREAN SECTION  2010   CESAREAN SECTION N/A 05/14/2014   Procedure: CESAREAN SECTION;  Surgeon: Cyril Mourning, MD;  Location: [facility];  Service: Obstetrics;  Laterality: N/A;   DILATION AND CURETTAGE OF UTERUS     miscarriage   MASTECTOMY W/ SENTINEL NODE BIOPSY Right  01/23/2021   Procedure: RIGHT MASTECTOMY WITH AXILLARY SENTINEL LYMPH NODE BIOPSY;  Surgeon: Rolm Bookbinder, MD;  Location: [place];  Service: General;  Laterality: Right;   RADIOACTIVE SEED GUIDED AXILLARY SENTINEL LYMPH NODE Right 01/23/2021   Procedure: RADIOACTIVE SEED GUIDED RIGHT AXILLARY SENTINEL LYMPH NODE EXCISION;  Surgeon: Rolm Bookbinder, MD;  Location: [place];  Service: General;  Laterality: Right;   REMOVAL OF TISSUE EXPANDER AND PLACEMENT OF IMPLANT Right 09/07/2021   Procedure: REMOVAL OF RIGHT CHEST TISSUE EXPANDER AND PLACEMENT OF SILICONE IMPLANT;  Surgeon: Irene Limbo, MD;  Location: [place];  Service: Plastics;  Laterality: Right;   THYROIDECTOMY      There were no vitals filed for this visit.   Subjective Assessment - 10/21/21 1304     Subjective My surgery is over.  Implant on the Right and partial implant Left.  I go back to work full time Monday.  I have noticed some swelling in the arm at the upper arm.  I have been wearing the sleeve 3-4 hours per day. Next step is a left breast lift and possibly making the right smaller. I want to do some stretches and need more movement    Pertinent History Rt mastectomy with expander 01/23/21 with Dr Donne Hazel and [place] with 2/4 nodes positive.  No chemotherapy needed, but will do doing radiation. (ended 04/17/21)    Currently in Pain?  No/denies                [facility] PT Assessment - 10/21/21 0001       AROM   Right Shoulder Extension 68 Degrees    Right Shoulder Flexion 158 Degrees   latissimus pull   Right Shoulder ABduction 155 Degrees   pectoralis tightness   Right Shoulder External Rotation 100 Degrees   pec pull               L-DEX FLOWSHEETS - 10/21/21 1300       L-DEX LYMPHEDEMA SCREENING   Measurement Type Unilateral    L-DEX MEASUREMENT EXTREMITY Upper Extremity    POSITION  Standing    DOMINANT SIDE Right    At Risk Side Right    BASELINE SCORE (UNILATERAL) -4.2     L-DEX SCORE (UNILATERAL) -4    VALUE CHANGE (UNILAT) 0.2                       [facility] Adult PT Treatment/Exercise - 10/21/21 0001       Exercises   Other Exercises  updated HEP      Manual Therapy   Soft tissue mobilization to the Rt pectoralis and latissimus in stretched positions    Passive ROM to the Rt shoulder into flexion, abduction, ER, D2                          PT Long Term Goals - 10/21/21 1357       PT LONG TERM GOAL #1   Title Pt will have right shoulder ROM and function nearly equal to left    Status On-going      PT LONG TERM GOAL #2   Title Pt will be independent with final HEP    Status On-going                   Plan - 10/21/21 1355     Clinical Impression Statement Pt returns post implant switch and left implant placement as well with continued Rt upper quadrant tightness from radiation.  MD has concerns of thin skin on the Rt so she has been instructed to be generally catious with impact, etc.  rechecked SOZO which was unchanged so no signs of lymphedema, updated HEP to focus on pectoralis and ER stretching as this seems to still be the trouble spot.  Pt will continue with STM/PROM and updates to HEP as needed    PT Frequency 1x / week    PT Duration 8 weeks    PT Treatment/Interventions ADLs/Self Care Home Management;Therapeutic exercise;Patient/family education;Manual lymph drainage;Manual techniques    PT Next Visit Plan STM/MFR Rt upper quadrant,  Cont every 3 month L-Dex screens for up to 2 years from [facility].    Consulted and Agree with Plan of Care Patient             Patient will benefit from skilled therapeutic intervention in order to improve the following deficits and impairments:     Visit Diagnosis: Aftercare following surgery for neoplasm  Abnormal posture  Malignant neoplasm of upper-outer quadrant of right breast in female, estrogen receptor positive [facility])     Problem List Patient Active  Problem List   Diagnosis Date Noted   S/P mastectomy, right 01/23/2021   Genetic testing 01/09/2021   Family history of thyroid cancer 01/02/2021   History of thyroid cancer 01/02/2021   Malignant neoplasm of upper-outer  quadrant of right female breast ([place]) 12/30/2020   S/P cesarean section 05/14/2014    Stark Bray, PT 10/21/2021, 1:58 PM  [facility] @ [address] [place], [place], [address] Phone: [phone]   Fax:  [phone]  Name: SEARRA CARNATHAN MRN: [id number] Date of Birth: Sep 26, 1981

## 2021-10-21 NOTE — Patient Instructions (Signed)
Access Code: [email or web address]: [email or web address]: 01/11/2023Prepared by: Marcene Brawn TevisExercises  Seated Lateral Trunk Stretch on Swiss Ball - 1-2 x daily - 7 x weekly - 1 sets - 10 reps - 2-3 second hold  Single Arm Doorway Pec Stretch at 90 Degrees Abduction - 1-3 x daily - 7 x weekly - 3 reps - 20 second hold  Supine Chest Stretch with Elbows Bent - 1 x daily - 7 x weekly - 3 reps - 1 sets - 30-60seconds hold

## 2021-10-22 ENCOUNTER — Other Ambulatory Visit: Payer: Self-pay | Admitting: *Deleted

## 2021-10-22 DIAGNOSIS — C50411 Malignant neoplasm of upper-outer quadrant of right female breast: Secondary | ICD-10-CM

## 2021-11-02 ENCOUNTER — Ambulatory Visit: Payer: Commercial Managed Care - PPO

## 2021-11-12 ENCOUNTER — Other Ambulatory Visit: Payer: Self-pay | Admitting: Hematology and Oncology

## 2021-12-02 ENCOUNTER — Other Ambulatory Visit: Payer: Self-pay

## 2021-12-02 ENCOUNTER — Telehealth (INDEPENDENT_AMBULATORY_CARE_PROVIDER_SITE_OTHER): Payer: Commercial Managed Care - PPO | Admitting: Family

## 2021-12-02 ENCOUNTER — Encounter: Payer: Self-pay | Admitting: Family

## 2021-12-02 VITALS — Ht 68.0 in | Wt 210.1 lb

## 2021-12-02 DIAGNOSIS — J209 Acute bronchitis, unspecified: Secondary | ICD-10-CM | POA: Diagnosis not present

## 2021-12-02 MED ORDER — AZITHROMYCIN 250 MG PO TABS: ORAL_TABLET | ORAL | 0 refills | Status: AC

## 2021-12-02 NOTE — Progress Notes (Signed)
MyChart Video Visit    Virtual Visit via Video Note   This visit type was conducted due to national recommendations for restrictions regarding the COVID-19 Pandemic (e.g. social distancing) in an effort to limit this patient's exposure and mitigate transmission in our community. This patient is at least at moderate risk for complications without adequate follow up. This format is felt to be most appropriate for this patient at this time. Physical exam was limited by quality of the video and audio technology used for the visit. CMA was able to get the patient set up on a video visit.  Patient location: Home. Patient and provider in visit Provider location: Office  I discussed the limitations of evaluation and management by telemedicine and the availability of in person appointments. The patient expressed understanding and agreed to proceed.  Visit Date: 12/02/2021  Today's healthcare provider: Jeanie Sewer, NP     Subjective:    Patient ID: Phyllis Harris, female    DOB: [birth year], 41 y.o.   MRN: [id number]  Chief Complaint  Patient presents with   Cough    Started 2 weeks ago. She says that her allergies get bad around this time. She denies fever. She has tried Mucinex, but has not helped as much. She is negative for Covid.     HPI Cough: Patient complains of productive cough with sputum described as yellow and green.  Symptoms began 2 weeks ago.  The cough is without wheezing, dyspnea or hemoptysis and is aggravated by infection, stress, and pollens Associated symptoms include:change in voice and sputum production. Patient does not have new pets. Patient does not have a history of asthma. Patient does have a history of environmental allergens. Patient has not had recent travel. Patient does not have a history of smoking.   Past Medical History:  Diagnosis Date   Anxiety    Cancer ([place]) 2008   thyroid    Family history of thyroid cancer 01/02/2021   GERD  (gastroesophageal reflux disease)    with pregnancy   History of chicken pox    History of radiation therapy 04/17/2021   03/04/2021-04/17/2021  right breast     Dr Gery Pray   History of thyroid cancer 01/02/2021   Hypothyroidism    PONV (postoperative nausea and vomiting)     Past Surgical History:  Procedure Laterality Date   BREAST ENHANCEMENT SURGERY Left 09/07/2021   Procedure: MAMMOPLASTY AUGMENTATION (BREAST) with placement of silicone implant;  Surgeon: Irene Limbo, MD;  Location: [facility];  Service: Plastics;  Laterality: Left;   BREAST RECONSTRUCTION WITH PLACEMENT OF TISSUE EXPANDER AND ALLODERM Right 01/23/2021   Procedure: BREAST RECONSTRUCTION WITH PLACEMENT OF TISSUE EXPANDER AND ALLODERM;  Surgeon: Irene Limbo, MD;  Location: [place];  Service: Plastics;  Laterality: Right;   CESAREAN SECTION  2010   CESAREAN SECTION N/A 05/14/2014   Procedure: CESAREAN SECTION;  Surgeon: Cyril Mourning, MD;  Location: [facility];  Service: Obstetrics;  Laterality: N/A;   DILATION AND CURETTAGE OF UTERUS     miscarriage   MASTECTOMY W/ SENTINEL NODE BIOPSY Right 01/23/2021   Procedure: RIGHT MASTECTOMY WITH AXILLARY SENTINEL LYMPH NODE BIOPSY;  Surgeon: Rolm Bookbinder, MD;  Location: [place];  Service: General;  Laterality: Right;   RADIOACTIVE SEED GUIDED AXILLARY SENTINEL LYMPH NODE Right 01/23/2021   Procedure: RADIOACTIVE SEED GUIDED RIGHT AXILLARY SENTINEL LYMPH NODE EXCISION;  Surgeon: Rolm Bookbinder, MD;  Location: [place];  Service: General;  Laterality: Right;  REMOVAL OF TISSUE EXPANDER AND PLACEMENT OF IMPLANT Right 09/07/2021   Procedure: REMOVAL OF RIGHT CHEST TISSUE EXPANDER AND PLACEMENT OF SILICONE IMPLANT;  Surgeon: Irene Limbo, MD;  Location: [place];  Service: Plastics;  Laterality: Right;   THYROIDECTOMY      Outpatient Medications Prior to Visit  Medication Sig Dispense Refill   acetaminophen (TYLENOL) 500 MG  tablet Take by mouth. Take as needed     levonorgestrel (MIRENA) 20 MCG/24HR IUD 1 each by Intrauterine route once. Inserted by GYN Dr. Gaetano Net 11/04/2017, needs to be removed in 10/2022.     levothyroxine (SYNTHROID, LEVOTHROID) 175 MCG tablet Take 200 mcg by mouth daily before breakfast.     Multiple Vitamin (MULTIVITAMIN WITH MINERALS) TABS tablet Take 1 tablet by mouth in the morning.     tamoxifen (NOLVADEX) 20 MG tablet TAKE 1 TABLET BY MOUTH EVERY DAY 30 tablet 1   No facility-administered medications prior to visit.    No Known Allergies      Objective:     Physical Exam Vitals and nursing note reviewed.  Constitutional:      General: She is not in acute distress.    Appearance: Normal appearance.  HENT:     Head: Normocephalic.  Pulmonary:     Effort: No respiratory distress.  Musculoskeletal:     Cervical back: Normal range of motion.  Skin:    General: Skin is dry.     Coloration: Skin is not pale.  Neurological:     Mental Status: She is alert and oriented to person, place, and time.  Psychiatric:        Mood and Affect: Mood normal.   Ht 5\' 8"  (1.727 m)    Wt 210 lb 1.6 oz (95.3 kg)    BMI 31.95 kg/m   Wt Readings from Last 3 Encounters:  12/02/21 210 lb 1.6 oz (95.3 kg)  10/09/21 210 lb 3.2 oz (95.3 kg)  09/07/21 219 lb 12.8 oz (99.7 kg)       Assessment & Plan:   Problem List Items Addressed This Visit   None Visit Diagnoses     Acute bronchitis, unspecified organism    -  Primary   reports coughing for 2 weeks, now her allergies are making it worse, taking antihistamine and mucinex daily without relief. 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 sending Zpack, advised on use & SE, drink plenty of water, continue allergy meds, tylenol or Ibuprofen for fever or pleuritic CP.  Relevant Medications   azithromycin (ZITHROMAX) 250 MG tablet       I discussed the assessment and treatment plan with the patient. The patient was provided an opportunity to ask  questions and all were answered. The patient agreed with the plan and demonstrated an understanding of the instructions.   The patient was advised to call back or seek an in-person evaluation if the symptoms worsen or if the condition fails to improve as anticipated.  I provided 21 minutes of face-to-face time during this encounter.   Jeanie Sewer, NP [place] [phone] (phone) [phone] (fax)  [place]

## 2021-12-13 ENCOUNTER — Other Ambulatory Visit: Payer: Self-pay | Admitting: Hematology and Oncology

## 2021-12-28 ENCOUNTER — Other Ambulatory Visit: Payer: Self-pay | Admitting: Hematology and Oncology

## 2021-12-29 ENCOUNTER — Ambulatory Visit
Admission: RE | Admit: 2021-12-29 | Discharge: 2021-12-29 | Disposition: A | Discharge status: Home / Self Care | Payer: Medicaid Other | Source: Ambulatory Visit | Attending: Adult Health | Admitting: Adult Health

## 2021-12-29 ENCOUNTER — Other Ambulatory Visit: Payer: Self-pay | Admitting: Adult Health

## 2021-12-29 DIAGNOSIS — C50411 Malignant neoplasm of upper-outer quadrant of right female breast: Secondary | ICD-10-CM

## 2021-12-29 HISTORY — DX: Personal history of irradiation: Z92.3

## 2022-01-18 ENCOUNTER — Ambulatory Visit: Payer: Self-pay

## 2022-03-24 ENCOUNTER — Encounter: Payer: Self-pay | Admitting: Rehabilitation

## 2022-04-20 ENCOUNTER — Encounter (HOSPITAL_COMMUNITY): Payer: Self-pay

## 2022-04-21 NOTE — H&P (Signed)
Subjective:     Patient ID: Phyllis Harris is a 41 y.o. female.   Follow-up   7 months post op implant exchange and left breast augmentation. Wt down 36 lb since time of implant placement. Returns today to discuss left breast lift and nipple reconstruction.    Presented with palpable right breast mass. Diagnostic MMG/US showed calcifications in the upper outer and upper inner right breast, 9.7cm, and multiple masses from the 9-2 o'clock position, 1.8cm, 3.3cm, 1.1cm, 2.8cm, 0.8cm, and 2 abnormal right axillary LN. Biopsies labed right breast 11 o clock, right breast 2 o clock, and LN all showed invasive mammary carcinoma with carcinoma in situ ER/PR+, Her 2-.    MRI demonstrated NME involving the upper inner and upper outer quadrants of the right breast consistent with the patient's known malignancy spanning 7.4 x 7.2 x 5.9 cm. On mammography, the suspicious calcifications spanned up to 9.7 cm. A 1.4 cm mass correlating with the patient's palpable lump involving the lateral aspect of the right nipple areolar complex with skin involvement in the areola. Two abnormal LN axillary noted, one with biopsy clip.   Staging scans negative for distant disease.   Final pathology 6.3 cm IDC with DCIS, margins clear. Metastatic carcinoma to on IM LN, 2/4 SLN positive for metastatic carcinoma, one with extranodal extension. Mammaprint low risk. Completed adjuvant RT 7.8.22. Plan tamoxifen 10 years. Recommended abemaciclib adjuvant therapy-patient declined.   Genetics negative. Mother and patient with history thyroid ca. Patient reports mother's carotid a resected with surgery, underwent latissimus dorsi reconstruction to breast.   Prior 36 B, desires C cup. Right mastectomy 637 g MMG 12/2021 benign   Works as Garment/textile technologist. Lives with spouse and two kids, one of which has special needs.   Review of Systems      Objective:   Physical Exam Cardiovascular:     Rate and Rhythm: Normal rate and  regular rhythm.     Heart sounds: Normal heart sounds.  Pulmonary:     Effort: Pulmonary effort is normal.     Breath sounds: Normal breath sounds.     Chest: soft bilateral right UOQ with depression contour  Left breast/NAC not centered over implant No lateral displcement in supine position Sn to nipple L 27 cm  Nipple to IMF L 9 cm Patient continues to report right feels larger volume with weight loss I feel more symmetric volume this visit Bending forward small rippling right noted  Assessment:     Right breast ca UOQ ER+ S/p right SSM, SLN, prepectoral TE/ADM (Alloderm) reconstruction Adjuvant radiation S/p removal right TE placement silicone implant left breast dual plane augmentation    Plan:     Patient reports she fills out bra equally- in this case recommend we do not change implant volumes.   Plan left breast lift. Reviewed anchor type scar and do not anticipate drain. Reviewed with develop recurrent ptosis over implant with aging. Over right patient desires nipple reconstruction. Plan local flap. We discussed options for areola. She returns today having concerns regarding skin graft as she does not want any pain with walking when returns to work. Plan to purse tattoo for this once healed.  Reviewed risks nipple will flatten, asymmetry position NAC with aging/ptosis left breast, need for additional procedures. Plan OP surgery, reviewed recovery and post op limitations.   Additional risks infection wound healing problems need for additional procedures asymmetry unacceptable cosmetic result blood clots in legs or lungs reviewed.   Patient has  pain medication muscle relaxant at home.   Natrelle Soft Touch Smooth Round implants placed bilateral.  RIGHT Extra Profile  650 ml REF [id number] LEFT Low Plus Profile 190 ml REF [id number]

## 2022-05-04 ENCOUNTER — Encounter (HOSPITAL_BASED_OUTPATIENT_CLINIC_OR_DEPARTMENT_OTHER): Payer: Self-pay

## 2022-05-04 ENCOUNTER — Ambulatory Visit (HOSPITAL_BASED_OUTPATIENT_CLINIC_OR_DEPARTMENT_OTHER): Admit: 2022-05-04 | Payer: Medicaid Other | Admitting: Plastic Surgery

## 2022-05-04 SURGERY — MASTOPEXY
Anesthesia: General | Site: Chest | Laterality: Right

## 2022-06-28 ENCOUNTER — Other Ambulatory Visit: Payer: Self-pay | Admitting: Hematology and Oncology

## 2022-07-05 ENCOUNTER — Encounter: Payer: Self-pay | Admitting: *Deleted

## 2022-07-12 ENCOUNTER — Encounter: Payer: Self-pay | Admitting: Physician Assistant

## 2022-07-12 ENCOUNTER — Telehealth (INDEPENDENT_AMBULATORY_CARE_PROVIDER_SITE_OTHER): Payer: Commercial Managed Care - PPO | Admitting: Physician Assistant

## 2022-07-12 VITALS — Ht 68.0 in | Wt 210.0 lb

## 2022-07-12 DIAGNOSIS — J0100 Acute maxillary sinusitis, unspecified: Secondary | ICD-10-CM

## 2022-07-12 MED ORDER — FLUCONAZOLE 150 MG PO TABS: 150.0000 mg | ORAL_TABLET | Freq: Every day | ORAL | 0 refills | Status: DC

## 2022-07-12 MED ORDER — CEFPROZIL 500 MG PO TABS: 500.0000 mg | ORAL_TABLET | Freq: Two times a day (BID) | ORAL | 0 refills | Status: AC

## 2022-07-12 NOTE — Progress Notes (Signed)
   Virtual Visit via Video Note  I connected with  Phyllis Harris  on 07/12/22 at 10:30 AM EDT by a video enabled telemedicine application and verified that I am speaking with the correct person using two identifiers.  Location: Patient: Work Secondary school teacher: Therapist, music at [employer] present: Patient and myself   I discussed the limitations of evaluation and management by telemedicine and the availability of in person appointments. The patient expressed understanding and agreed to proceed.   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   Assessment and Plan:  Acute maxillary sinusitis, recurrence not specified Persistent symptoms despite conservative efforts at home. Will Rx Cefprozil at this time, take with food. Cautioned on antibiotic use and possible side effects. Advised nasal saline, humidifier, and pushing fluids. Call if worse or no improvement.    Follow Up Instructions:    I discussed the assessment and treatment plan with the patient. The patient was provided an opportunity to ask questions and all were answered. The patient agreed with the plan and demonstrated an understanding of the instructions.   The patient was advised to call back or seek an in-person evaluation if the symptoms worsen or if the condition fails to improve as anticipated.  Shermon Bozzi M Brynnan Rodenbaugh,  PA-C

## 2022-09-06 ENCOUNTER — Ambulatory Visit (INDEPENDENT_AMBULATORY_CARE_PROVIDER_SITE_OTHER): Payer: Commercial Managed Care - PPO | Admitting: Family Medicine

## 2022-09-06 ENCOUNTER — Encounter: Payer: Self-pay | Admitting: Family Medicine

## 2022-09-06 ENCOUNTER — Telehealth: Payer: Commercial Managed Care - PPO | Admitting: Physician Assistant

## 2022-09-06 VITALS — BP 110/80 | HR 64 | Temp 98.0°F | Ht 68.0 in | Wt 190.2 lb

## 2022-09-06 DIAGNOSIS — R300 Dysuria: Secondary | ICD-10-CM

## 2022-09-06 LAB — POCT URINALYSIS DIPSTICK
Bilirubin, UA: NEGATIVE
Blood, UA: NEGATIVE
Glucose, UA: NEGATIVE
Ketones, UA: NEGATIVE
Leukocytes, UA: NEGATIVE
Nitrite, UA: NEGATIVE
Protein, UA: NEGATIVE
Spec Grav, UA: <=1.005 — AB (ref 1.010–1.025)
Urobilinogen, UA: 0.2 E.U./dL
pH, UA: 6.5 (ref 5.0–8.0)

## 2022-09-06 MED ORDER — FLUCONAZOLE 150 MG PO TABS: 150.0000 mg | ORAL_TABLET | ORAL | 0 refills | Status: DC | PRN

## 2022-09-06 MED ORDER — SULFAMETHOXAZOLE-TRIMETHOPRIM 800-160 MG PO TABS: 1.0000 | ORAL_TABLET | Freq: Two times a day (BID) | ORAL | 0 refills | Status: DC

## 2022-09-06 NOTE — Patient Instructions (Signed)
It was very nice to see you today!  Happy Holidays!   PLEASE NOTE:  If you had any lab tests please let us know if you have not heard back within a few days. You may see your results on MyChart before we have a chance to review them but we will give you a call once they are reviewed by [place]. If we ordered any referrals today, please let us know if you have not heard from their office within the next week.   Please try these tips to maintain a healthy lifestyle:  Eat most of your calories during the day when you are active. Eliminate processed foods including packaged sweets (pies, cakes, cookies), reduce intake of potatoes, white bread, white pasta, and white rice. Look for whole grain options, oat flour or almond flour.  Each meal should contain half fruits/vegetables, one quarter protein, and one quarter carbs (no bigger than a computer mouse).  Cut down on sweet beverages. This includes juice, soda, and sweet tea. Also watch fruit intake, though this is a healthier sweet option, it still contains natural sugar! Limit to 3 servings daily.  Drink at least 1 glass of water with each meal and aim for at least 8 glasses per day  Exercise at least 150 minutes every week.

## 2022-09-06 NOTE — Progress Notes (Signed)
Subjective:     Patient ID: Phyllis Harris, female    DOB: [birth year], 41 y.o.   MRN: [id number]  Chief Complaint  Patient presents with   Dysuria    Pt c/o frequency and burning with urination started yesterday. Denies back pain, no fever or chills.    HPI Since yest, urinary freq and dysuria, nocturia x2.  No back pain. No f/c, no abd pain.  Taking some azo.  Some chronic vag d/c from tamoxifen.   Health Maintenance Due  Topic Date Due   COVID-19 Vaccine (1) Never done   PAP SMEAR-Modifier  10/20/2020    Past Medical History:  Diagnosis Date   Anxiety    Breast cancer ([place]) 01/23/2021   mastectomy   Cancer ([place]) 2008   thyroid    Family history of thyroid cancer 01/02/2021   GERD (gastroesophageal reflux disease)    with pregnancy   History of chicken pox    History of radiation therapy 04/17/2021   03/04/2021-04/17/2021  right breast     Dr Gery Pray   History of thyroid cancer 01/02/2021   Hypothyroidism    Personal history of radiation therapy    2022   PONV (postoperative nausea and vomiting)     Past Surgical History:  Procedure Laterality Date   AUGMENTATION MAMMAPLASTY Left 09/07/2021   BREAST ENHANCEMENT SURGERY Left 09/07/2021   Procedure: MAMMOPLASTY AUGMENTATION (BREAST) with placement of silicone implant;  Surgeon: Irene Limbo, MD;  Location: [facility];  Service: Plastics;  Laterality: Left;   BREAST RECONSTRUCTION WITH PLACEMENT OF TISSUE EXPANDER AND ALLODERM Right 01/23/2021   Procedure: BREAST RECONSTRUCTION WITH PLACEMENT OF TISSUE EXPANDER AND ALLODERM;  Surgeon: Irene Limbo, MD;  Location: [facility];  Service: Plastics;  Laterality: Right;   CESAREAN SECTION  2010   CESAREAN SECTION N/A 05/14/2014   Procedure: CESAREAN SECTION;  Surgeon: Cyril Mourning, MD;  Location: [facility];  Service: Obstetrics;  Laterality: N/A;   DILATION AND CURETTAGE OF UTERUS     miscarriage   MASTECTOMY W/ SENTINEL NODE BIOPSY Right  01/23/2021   Procedure: RIGHT MASTECTOMY WITH AXILLARY SENTINEL LYMPH NODE BIOPSY;  Surgeon: Rolm Bookbinder, MD;  Location: [facility];  Service: General;  Laterality: Right;   RADIOACTIVE SEED GUIDED AXILLARY SENTINEL LYMPH NODE Right 01/23/2021   Procedure: RADIOACTIVE SEED GUIDED RIGHT AXILLARY SENTINEL LYMPH NODE EXCISION;  Surgeon: Rolm Bookbinder, MD;  Location: [facility];  Service: General;  Laterality: Right;   REMOVAL OF TISSUE EXPANDER AND PLACEMENT OF IMPLANT Right 09/07/2021   Procedure: REMOVAL OF RIGHT CHEST TISSUE EXPANDER AND PLACEMENT OF SILICONE IMPLANT;  Surgeon: Irene Limbo, MD;  Location: [facility];  Service: Plastics;  Laterality: Right;   THYROIDECTOMY      Outpatient Medications Prior to Visit  Medication Sig Dispense Refill   levonorgestrel (MIRENA) 20 MCG/24HR IUD 1 each by Intrauterine route once. Inserted by GYN Dr. Gaetano Net 11/04/2017, needs to be removed in 10/2022.     levothyroxine (SYNTHROID) 112 MCG tablet Take 224 mcg by mouth daily.     Multiple Vitamin (MULTIVITAMIN WITH MINERALS) TABS tablet Take 1 tablet by mouth in the morning.     tamoxifen (NOLVADEX) 20 MG tablet TAKE 1 TABLET BY MOUTH EVERY DAY 90 tablet 1   acetaminophen (TYLENOL) 500 MG tablet Take by mouth. Take as needed     fluconazole (DIFLUCAN) 150 MG tablet Take 1 tablet (150 mg total) by mouth daily. 1 tablet 0   levothyroxine (SYNTHROID,  LEVOTHROID) 175 MCG tablet Take 200 mcg by mouth daily before breakfast.     No facility-administered medications prior to visit.    No Known Allergies ROS neg/noncontributory except as noted HPI/below      Objective:     BP 110/80 (BP Location: Left Arm, Patient Position: Sitting, Cuff Size: Normal)   Pulse 64   Temp 98 F (36.7 C) (Temporal)   Ht '5\' 8"'$  (1.727 m)   Wt 190 lb 4 oz (86.3 kg)   SpO2 98%   BMI 28.93 kg/m  Wt Readings from Last 3 Encounters:  09/06/22 190 lb 4 oz (86.3 kg)  07/12/22 210 lb (95.3 kg)  12/02/21  210 lb 1.6 oz (95.3 kg)    Physical Exam   Gen: WDWN NAD HEENT: NCAT, conjunctiva not injected, sclera nonicteric NECK:  supple, no thyromegaly, no nodes, no carotid bruits CARDIAC: RRR, S1S2+, no murmur. DP 2+B LUNGS: CTAB. No wheezes ABDOMEN:  BS+, soft, tender suprapubically, No HSM, no masses. No CVAT EXT:  no edema MSK: no gross abnormalities.  NEURO: A&O x3.  CN II-XII intact.  PSYCH: normal mood. Good eye contact  Results for orders placed or performed in visit on 09/06/22  POCT urinalysis dipstick  Result Value Ref Range   Color, UA yellow    Clarity, UA cloudy    Glucose, UA Negative Negative   Bilirubin, UA negative    Ketones, UA negative    Spec Grav, UA <=1.005 (A) 1.010 - 1.025   Blood, UA negative    pH, UA 6.5 5.0 - 8.0   Protein, UA Negative Negative   Urobilinogen, UA 0.2 0.2 or 1.0 E.U./dL   Nitrite, UA negative    Leukocytes, UA Negative Negative   Appearance     Odor          Assessment & Plan:   Problem List Items Addressed This Visit   None Visit Diagnoses     Dysuria    -  Primary   Relevant Orders   POCT urinalysis dipstick (Completed)   Urine Culture      Dysuria-UA neg but symptoms consistent.  Will send for cx.  Will do bactrim ds bid x 7days and diflucan '150mg'$  if needed for yeast.   Sch annual w/Sam  Meds ordered this encounter  Medications   sulfamethoxazole-trimethoprim (BACTRIM DS) 800-160 MG tablet    Sig: Take 1 tablet by mouth 2 (two) times daily.    Dispense:  14 tablet    Refill:  0   fluconazole (DIFLUCAN) 150 MG tablet    Sig: Take 1 tablet (150 mg total) by mouth every three (3) days as needed.    Dispense:  2 tablet    Refill:  0    Wellington Hampshire, MD

## 2022-09-07 LAB — URINE CULTURE
MICRO NUMBER:: 14233828
Result:: NO GROWTH
SPECIMEN QUALITY:: ADEQUATE

## 2022-10-21 ENCOUNTER — Encounter: Payer: Self-pay | Admitting: Physician Assistant

## 2022-10-21 ENCOUNTER — Ambulatory Visit (INDEPENDENT_AMBULATORY_CARE_PROVIDER_SITE_OTHER): Payer: Managed Care, Other (non HMO) | Admitting: Physician Assistant

## 2022-10-21 VITALS — BP 140/90 | HR 68 | Temp 98.0°F | Ht 68.0 in | Wt 195.4 lb

## 2022-10-21 DIAGNOSIS — J029 Acute pharyngitis, unspecified: Secondary | ICD-10-CM | POA: Diagnosis not present

## 2022-10-21 LAB — POCT RAPID STREP A (OFFICE): Rapid Strep A Screen: POSITIVE — AB

## 2022-10-21 MED ORDER — AMOXICILLIN 500 MG PO CAPS: 500.0000 mg | ORAL_CAPSULE | Freq: Two times a day (BID) | ORAL | 0 refills | Status: AC

## 2022-10-21 NOTE — Progress Notes (Signed)
Phyllis Harris is a 42 y.o. female here for a new problem.  History of Present Illness:   Chief Complaint  Patient presents with   Sore Throat    Pt c/o sore throat x 1 week.   Eye Drainage    Pt c/o left eye drainage, burning some. Son had pink eye.     HPI  Respiratory symptoms: Patient complains of sore throat and cough. She denies fever, chills, or chest congestion. She notes that these symptoms started around a week ago. Patient reports she has never tested positive for Strep.   Pink eye: Patient reports she noticed pick eye this morning. She notes her son recently had pick eye as well.   Past Medical History:  Diagnosis Date   Anxiety    Breast cancer ([place]) 01/23/2021   mastectomy   Cancer ([place]) 2008   thyroid    Family history of thyroid cancer 01/02/2021   GERD (gastroesophageal reflux disease)    with pregnancy   History of chicken pox    History of radiation therapy 04/17/2021   03/04/2021-04/17/2021  right breast     Dr Gery Pray   History of thyroid cancer 01/02/2021   Hypothyroidism    Personal history of radiation therapy    2022   PONV (postoperative nausea and vomiting)      Social History   Tobacco Use   Smoking status: Never   Smokeless tobacco: Never  Vaping Use   Vaping Use: Never used  Substance Use Topics   Alcohol use: Yes    Comment: occassional   Drug use: No    Past Surgical History:  Procedure Laterality Date   AUGMENTATION MAMMAPLASTY Left 09/07/2021   BREAST ENHANCEMENT SURGERY Left 09/07/2021   Procedure: MAMMOPLASTY AUGMENTATION (BREAST) with placement of silicone implant;  Surgeon: Irene Limbo, MD;  Location: [facility];  Service: Plastics;  Laterality: Left;   BREAST RECONSTRUCTION WITH PLACEMENT OF TISSUE EXPANDER AND ALLODERM Right 01/23/2021   Procedure: BREAST RECONSTRUCTION WITH PLACEMENT OF TISSUE EXPANDER AND ALLODERM;  Surgeon: Irene Limbo, MD;  Location: [facility];  Service: Plastics;   Laterality: Right;   CESAREAN SECTION  2010   CESAREAN SECTION N/A 05/14/2014   Procedure: CESAREAN SECTION;  Surgeon: Cyril Mourning, MD;  Location: [facility];  Service: Obstetrics;  Laterality: N/A;   DILATION AND CURETTAGE OF UTERUS     miscarriage   MASTECTOMY W/ SENTINEL NODE BIOPSY Right 01/23/2021   Procedure: RIGHT MASTECTOMY WITH AXILLARY SENTINEL LYMPH NODE BIOPSY;  Surgeon: Rolm Bookbinder, MD;  Location: [facility];  Service: General;  Laterality: Right;   RADIOACTIVE SEED GUIDED AXILLARY SENTINEL LYMPH NODE Right 01/23/2021   Procedure: RADIOACTIVE SEED GUIDED RIGHT AXILLARY SENTINEL LYMPH NODE EXCISION;  Surgeon: Rolm Bookbinder, MD;  Location: [facility];  Service: General;  Laterality: Right;   REMOVAL OF TISSUE EXPANDER AND PLACEMENT OF IMPLANT Right 09/07/2021   Procedure: REMOVAL OF RIGHT CHEST TISSUE EXPANDER AND PLACEMENT OF SILICONE IMPLANT;  Surgeon: Irene Limbo, MD;  Location: [facility];  Service: Plastics;  Laterality: Right;   THYROIDECTOMY      Family History  Problem Relation Age of Onset   Thyroid cancer Mother        anaplastic; dx late 86s   Diabetes Mother    Hyperlipidemia Mother    Kidney disease Mother    COPD Father    Hearing loss Father    Heart attack Maternal Grandfather    Heart attack Paternal Grandfather  Non-Hodgkin's lymphoma Maternal Grandmother        d.89   Breast cancer Neg Hx    Colon cancer Neg Hx     No Known Allergies  Current Medications:   Current Outpatient Medications:    amoxicillin (AMOXIL) 500 MG capsule, Take 1 capsule (500 mg total) by mouth 2 (two) times daily for 10 days., Disp: 20 capsule, Rfl: 0   levonorgestrel (MIRENA) 20 MCG/24HR IUD, 1 each by Intrauterine route once. Inserted by GYN Dr. Gaetano Net 11/04/2017, needs to be removed in 10/2022., Disp: , Rfl:    levothyroxine (SYNTHROID) 112 MCG tablet, Take 224 mcg by mouth daily., Disp: , Rfl:    Multiple Vitamin (MULTIVITAMIN WITH MINERALS)  TABS tablet, Take 1 tablet by mouth in the morning., Disp: , Rfl:    tamoxifen (NOLVADEX) 20 MG tablet, TAKE 1 TABLET BY MOUTH EVERY DAY, Disp: 90 tablet, Rfl: 1   Review of Systems:   Review of Systems  Constitutional:  Negative for chills, fever, malaise/fatigue and weight loss.  HENT:  Positive for sore throat. Negative for congestion, hearing loss and sinus pain.   Respiratory:  Positive for cough. Negative for hemoptysis.   Cardiovascular:  Negative for chest pain, palpitations, orthopnea, leg swelling and PND.  Gastrointestinal:  Negative for abdominal pain, constipation, diarrhea, heartburn, nausea and vomiting.  Genitourinary:  Negative for dysuria, frequency and urgency.  Musculoskeletal:  Negative for back pain, myalgias and neck pain.  Skin:  Negative for itching and rash.  Endo/Heme/Allergies:  Negative for polydipsia.  Psychiatric/Behavioral:  Negative for depression. The patient is not nervous/anxious.     Vitals:   Vitals:   10/21/22 1005  BP: (!) 140/90  Pulse: 68  Temp: 98 F (36.7 C)  TempSrc: Temporal  SpO2: 96%  Weight: 195 lb 6.1 oz (88.6 kg)  Height: '5\' 8"'$  (1.727 m)     Body mass index is 29.71 kg/m.  Physical Exam:   Physical Exam Vitals and nursing note reviewed.  Constitutional:      General: She is not in acute distress.    Appearance: Normal appearance. She is well-developed. She is not ill-appearing or toxic-appearing.  HENT:     Head: Normocephalic and atraumatic.     Right Ear: Tympanic membrane, ear canal and external ear normal. Tympanic membrane is not erythematous, retracted or bulging.     Left Ear: Tympanic membrane, ear canal and external ear normal. Tympanic membrane is not erythematous, retracted or bulging.     Nose: Nose normal.     Right Sinus: No maxillary sinus tenderness or frontal sinus tenderness.     Left Sinus: No maxillary sinus tenderness or frontal sinus tenderness.     Mouth/Throat:     Pharynx: Uvula midline.  Posterior oropharyngeal erythema present.     Tonsils: No tonsillar exudate. 1+ on the right. 1+ on the left.  Eyes:     General: Lids are normal.     Extraocular Movements: Extraocular movements intact.     Conjunctiva/sclera:     Left eye: Left conjunctiva is injected. No exudate or hemorrhage.    Pupils: Pupils are equal, round, and reactive to light.  Neck:     Trachea: Trachea normal.  Cardiovascular:     Rate and Rhythm: Normal rate and regular rhythm.     Pulses: Normal pulses.     Heart sounds: Normal heart sounds, S1 normal and S2 normal. No murmur heard.    No gallop.  Pulmonary:     Effort:  Pulmonary effort is normal. No respiratory distress.     Breath sounds: Normal breath sounds. No decreased breath sounds, wheezing, rhonchi or rales.  Lymphadenopathy:     Cervical: No cervical adenopathy.  Skin:    General: Skin is warm and dry.  Neurological:     Mental Status: She is alert and oriented to person, place, and time.     GCS: GCS eye subscore is 4. GCS verbal subscore is 5. GCS motor subscore is 6.  Psychiatric:        Speech: Speech normal.        Behavior: Behavior normal. Behavior is cooperative.        Judgment: Judgment normal.    Results for orders placed or performed in visit on 10/21/22  POCT rapid strep A  Result Value Ref Range   Rapid Strep A Screen Positive (A) Negative    Assessment and Plan:   Sore throat Strep test faintly positive -- will treat given HPI Start amoxicillin 500 mg BID x 10 days She has polytrim drops at home to use for possible pink eye Push fluids and rest Follow-up if new/worsening  I,Param Shah,acting as a scribe for [employer], PA.,have documented all relevant documentation on the behalf of Inda Coke, PA,as directed by  Inda Coke, PA while in the presence of Inda Coke, [place].  I, Inda Coke, [place], have reviewed all documentation for this visit. The documentation on 10/21/22 for the exam, diagnosis,  procedures, and orders are all accurate and complete.  Inda Coke, PA-C

## 2022-10-22 ENCOUNTER — Other Ambulatory Visit: Payer: Self-pay | Admitting: Physician Assistant

## 2022-10-22 MED ORDER — POLYMYXIN B-TRIMETHOPRIM 10000-0.1 UNIT/ML-% OP SOLN: 1.0000 [drp] | OPHTHALMIC | 0 refills | Status: DC

## 2022-11-05 MED ORDER — AZITHROMYCIN 250 MG PO TABS: ORAL_TABLET | ORAL | 0 refills | Status: AC

## 2022-11-15 ENCOUNTER — Other Ambulatory Visit: Payer: Self-pay | Admitting: Adult Health

## 2022-11-15 DIAGNOSIS — Z1231 Encounter for screening mammogram for malignant neoplasm of breast: Secondary | ICD-10-CM

## 2022-11-23 ENCOUNTER — Other Ambulatory Visit: Payer: Self-pay | Admitting: Hematology and Oncology

## 2022-11-25 ENCOUNTER — Telehealth: Payer: Self-pay | Admitting: Hematology and Oncology

## 2022-11-25 NOTE — Telephone Encounter (Signed)
Scheduled appointment per staff message. Left voicemail.

## 2023-01-03 ENCOUNTER — Ambulatory Visit
Admission: RE | Admit: 2023-01-03 | Discharge: 2023-01-03 | Disposition: A | Discharge status: Home / Self Care | Payer: Managed Care, Other (non HMO) | Source: Ambulatory Visit | Attending: Adult Health | Admitting: Adult Health

## 2023-01-03 DIAGNOSIS — Z1231 Encounter for screening mammogram for malignant neoplasm of breast: Secondary | ICD-10-CM

## 2023-01-05 ENCOUNTER — Encounter: Payer: Self-pay | Admitting: Physician Assistant

## 2023-01-05 ENCOUNTER — Ambulatory Visit (INDEPENDENT_AMBULATORY_CARE_PROVIDER_SITE_OTHER): Payer: Managed Care, Other (non HMO) | Admitting: Physician Assistant

## 2023-01-05 VITALS — BP 120/72 | HR 66 | Temp 97.5°F | Ht 68.5 in | Wt 187.4 lb

## 2023-01-05 DIAGNOSIS — Z0000 Encounter for general adult medical examination without abnormal findings: Secondary | ICD-10-CM | POA: Diagnosis not present

## 2023-01-05 DIAGNOSIS — E663 Overweight: Secondary | ICD-10-CM

## 2023-01-05 DIAGNOSIS — E890 Postprocedural hypothyroidism: Secondary | ICD-10-CM

## 2023-01-05 DIAGNOSIS — C50411 Malignant neoplasm of upper-outer quadrant of right female breast: Secondary | ICD-10-CM

## 2023-01-05 DIAGNOSIS — Z170 Estrogen receptor positive status [ER+]: Secondary | ICD-10-CM

## 2023-01-05 LAB — HEMOGLOBIN A1C: Hgb A1c MFr Bld: 5.5 % (ref 4.6–6.5)

## 2023-01-05 LAB — CBC WITH DIFFERENTIAL/PLATELET
Basophils Absolute: 0 10*3/uL (ref 0.0–0.1)
Basophils Relative: 0.8 % (ref 0.0–3.0)
Eosinophils Absolute: 0.1 10*3/uL (ref 0.0–0.7)
Eosinophils Relative: 1.3 % (ref 0.0–5.0)
HCT: 39.5 % (ref 36.0–46.0)
Hemoglobin: 13.3 g/dL (ref 12.0–15.0)
Lymphocytes Relative: 21.7 % (ref 12.0–46.0)
Lymphs Abs: 1.3 10*3/uL (ref 0.7–4.0)
MCHC: 33.6 g/dL (ref 30.0–36.0)
MCV: 89.2 fl (ref 78.0–100.0)
Monocytes Absolute: 0.5 10*3/uL (ref 0.1–1.0)
Monocytes Relative: 8.2 % (ref 3.0–12.0)
Neutro Abs: 4 10*3/uL (ref 1.4–7.7)
Neutrophils Relative %: 68 % (ref 43.0–77.0)
Platelets: 214 10*3/uL (ref 150.0–400.0)
RBC: 4.42 Mil/uL (ref 3.87–5.11)
RDW: 12.8 % (ref 11.5–15.5)
WBC: 5.9 10*3/uL (ref 4.0–10.5)

## 2023-01-05 LAB — COMPREHENSIVE METABOLIC PANEL
ALT: 35 U/L (ref 0–35)
AST: 30 U/L (ref 0–37)
Albumin: 4.4 g/dL (ref 3.5–5.2)
Alkaline Phosphatase: 52 U/L (ref 39–117)
BUN: 11 mg/dL (ref 6–23)
CO2: 25 mEq/L (ref 19–32)
Calcium: 8.7 mg/dL (ref 8.4–10.5)
Chloride: 106 mEq/L (ref 96–112)
Creatinine, Ser: 0.69 mg/dL (ref 0.40–1.20)
GFR: 105.78 mL/min (ref >60.00)
Glucose, Bld: 104 mg/dL — ABNORMAL HIGH (ref 70–99)
Potassium: 4.2 mEq/L (ref 3.5–5.1)
Sodium: 140 mEq/L (ref 135–145)
Total Bilirubin: 0.4 mg/dL (ref 0.2–1.2)
Total Protein: 6.9 g/dL (ref 6.0–8.3)

## 2023-01-05 LAB — LIPID PANEL
Cholesterol: 134 mg/dL (ref 0–200)
HDL: 61.5 mg/dL (ref >39.00)
LDL Cholesterol: 50 mg/dL (ref 0–99)
NonHDL: 72.2
Total CHOL/HDL Ratio: 2
Triglycerides: 113 mg/dL (ref 0.0–149.0)
VLDL: 22.6 mg/dL (ref 0.0–40.0)

## 2023-01-05 NOTE — Progress Notes (Signed)
Subjective:    Phyllis Harris is a 42 y.o. female and is here for a comprehensive physical exam.  HPI  There are no preventive care reminders to display for this patient.  Acute Concerns: None.  Chronic Issues: Breast Cancer Treated with Nolvadex 20 mg daily. Reports she is tolerating this generally well. Recent mammogram was normal!  Hypothyroidism Treated with Synthroid 224 mcg daily x 6 days and 112 mcg once a week. Followed by Dr Chalmers Cater and UTD on care.  Mirena IUD in place. Reports blood sugar has been stable recently. Denies unusual headaches, hemiparesis, weakness in ankles after long periods of standing, GI symptoms. Seen by dermatologist regularly and has had several growths removed.  Health Maintenance: Immunizations -- UTD on flu, tetanus, Covid-19 vaccines. Colonoscopy -- N/A. Denies family h/o colon cancer. Mammogram -- Last completed 01/04/23. No mammographic evidence of malignancy. Recommended repeat in 2025. PAP -- UTD - requesting records Bone Density -- N/A Diet -- Healthy diet. Exercise -- Not currently exercising.  Sleep habits -- Stable. Mood -- Stable.  UTD with dentist? - UTD UTD with eye doctor? - UTD  Weight history: Wt Readings from Last 10 Encounters:  01/05/23 187 lb 6.1 oz (85 kg)  10/21/22 195 lb 6.1 oz (88.6 kg)  09/06/22 190 lb 4 oz (86.3 kg)  07/12/22 210 lb (95.3 kg)  12/02/21 210 lb 1.6 oz (95.3 kg)  10/09/21 210 lb 3.2 oz (95.3 kg)  09/07/21 219 lb 12.8 oz (99.7 kg)  06/11/21 221 lb 9.6 oz (100.5 kg)  04/16/21 223 lb 7 oz (101.4 kg)  02/18/21 218 lb 4 oz (99 kg)   Body mass index is 28.08 kg/m. No LMP recorded. (Menstrual status: IUD).  Alcohol use:  reports current alcohol use.  Tobacco use:  Tobacco Use: Low Risk  (01/05/2023)   Patient History    Smoking Tobacco Use: Never    Smokeless Tobacco Use: Never    Passive Exposure: Not on file   Eligible for lung cancer screening? No     01/05/2023    8:43 AM   Depression screen PHQ 2/9  Decreased Interest 0  Down, Depressed, Hopeless 0  PHQ - 2 Score 0     Other providers/specialists: Patient Care Team: Inda Coke, [place] as PCP - General (Physician Assistant) Jacelyn Pi, MD as Referring Physician (Endocrinology) Everlene Farrier, MD as Consulting Physician (Obstetrics and Gynecology) Nicholas Lose, MD as Consulting Physician (Hematology and Oncology) Gery Pray, MD as Consulting Physician (Radiation Oncology) Irene Limbo, MD as Consulting Physician (Plastic Surgery) Rolm Bookbinder, MD as Consulting Physician (General Surgery) [place], Charlestine Massed, NP as Nurse Practitioner (Hematology and Oncology) Harmon Pier, RN as Registered Nurse    PMHx, SurgHx, SocialHx, Medications, and Allergies were reviewed in the Visit Navigator and updated as appropriate.   Past Medical History:  Diagnosis Date   Anxiety    Breast cancer ([place]) 01/23/2021   mastectomy   Cancer ([place]) 2008   thyroid    Family history of thyroid cancer 01/02/2021   GERD (gastroesophageal reflux disease)    with pregnancy   History of chicken pox    History of radiation therapy 04/17/2021   03/04/2021-04/17/2021  right breast     Dr Gery Pray   History of thyroid cancer 01/02/2021   Hypothyroidism    Personal history of radiation therapy    2022   PONV (postoperative nausea and vomiting)      Past Surgical History:  Procedure Laterality Date  AUGMENTATION MAMMAPLASTY Left 09/07/2021   BREAST ENHANCEMENT SURGERY Left 09/07/2021   Procedure: MAMMOPLASTY AUGMENTATION (BREAST) with placement of silicone implant;  Surgeon: Irene Limbo, MD;  Location: [facility];  Service: Plastics;  Laterality: Left;   BREAST RECONSTRUCTION WITH PLACEMENT OF TISSUE EXPANDER AND ALLODERM Right 01/23/2021   Procedure: BREAST RECONSTRUCTION WITH PLACEMENT OF TISSUE EXPANDER AND ALLODERM;  Surgeon: Irene Limbo, MD;  Location: [facility];   Service: Plastics;  Laterality: Right;   CESAREAN SECTION  2010   CESAREAN SECTION N/A 05/14/2014   Procedure: CESAREAN SECTION;  Surgeon: Cyril Mourning, MD;  Location: [facility];  Service: Obstetrics;  Laterality: N/A;   DILATION AND CURETTAGE OF UTERUS     miscarriage   MASTECTOMY W/ SENTINEL NODE BIOPSY Right 01/23/2021   Procedure: RIGHT MASTECTOMY WITH AXILLARY SENTINEL LYMPH NODE BIOPSY;  Surgeon: Rolm Bookbinder, MD;  Location: [facility];  Service: General;  Laterality: Right;   RADIOACTIVE SEED GUIDED AXILLARY SENTINEL LYMPH NODE Right 01/23/2021   Procedure: RADIOACTIVE SEED GUIDED RIGHT AXILLARY SENTINEL LYMPH NODE EXCISION;  Surgeon: Rolm Bookbinder, MD;  Location: [facility];  Service: General;  Laterality: Right;   REMOVAL OF TISSUE EXPANDER AND PLACEMENT OF IMPLANT Right 09/07/2021   Procedure: REMOVAL OF RIGHT CHEST TISSUE EXPANDER AND PLACEMENT OF SILICONE IMPLANT;  Surgeon: Irene Limbo, MD;  Location: [facility];  Service: Plastics;  Laterality: Right;   THYROIDECTOMY       Family History  Problem Relation Age of Onset   Thyroid cancer Mother        anaplastic; dx late 51s   Diabetes Mother    Hyperlipidemia Mother    Kidney disease Mother    COPD Father    Hearing loss Father    Non-Hodgkin's lymphoma Maternal Grandmother        d.89   Heart attack Maternal Grandfather    Heart attack Paternal Grandfather    Colon cancer Neg Hx     Social History   Tobacco Use   Smoking status: Never   Smokeless tobacco: Never  Vaping Use   Vaping Use: Never used  Substance Use Topics   Alcohol use: Yes    Comment: occassional   Drug use: No    Review of Systems:   Review of Systems  Constitutional:  Negative for chills, fever, malaise/fatigue and weight loss.  HENT:  Negative for hearing loss, sinus pain and sore throat.   Respiratory:  Negative for cough, hemoptysis and shortness of breath.   Cardiovascular:  Negative for chest pain,  palpitations, leg swelling and PND.  Gastrointestinal:  Negative for abdominal pain, constipation, diarrhea, heartburn, nausea and vomiting.  Genitourinary:  Negative for dysuria, frequency and urgency.  Musculoskeletal:  Negative for back pain, myalgias and neck pain.  Skin:  Negative for itching and rash.  Neurological:  Negative for dizziness, tingling, seizures and headaches.       (-) Hemiparesis  Endo/Heme/Allergies:  Negative for polydipsia.  Psychiatric/Behavioral:  Negative for depression. The patient is not nervous/anxious.     Objective:   BP 120/72 (BP Location: Left Arm, Patient Position: Sitting, Cuff Size: Normal)   Pulse 66   Temp (!) 97.5 F (36.4 C) (Temporal)   Ht 5' 8.5" (1.74 m)   Wt 187 lb 6.1 oz (85 kg)   SpO2 98%   BMI 28.08 kg/m  Body mass index is 28.08 kg/m.   General Appearance:    Alert, cooperative, no distress, appears stated age  Head:  Normocephalic, without obvious abnormality, atraumatic  Eyes:    PERRL, conjunctiva/corneas clear, EOM's intact, fundi    benign, both eyes  Ears:    Normal TM's and external ear canals, both ears  Nose:   Nares normal, septum midline, mucosa normal, no drainage    or sinus tenderness  Throat:   Lips, mucosa, and tongue normal; teeth and gums normal  Neck:   Supple, symmetrical, trachea midline, no adenopathy;    thyroid:  no enlargement/tenderness/nodules; no carotid   bruit or JVD  Back:     Symmetric, no curvature, ROM normal, no CVA tenderness  Lungs:     Clear to auscultation bilaterally, respirations unlabored  Chest Wall:    No tenderness or deformity   Heart:    Regular rate and rhythm, S1 and S2 normal, no murmur, rub or gallop  Breast Exam:    Deferred   Abdomen:     Soft, non-tender, bowel sounds active all four quadrants,    no masses, no organomegaly  Genitalia:    Deferred   Extremities:   Extremities normal, atraumatic, no cyanosis or edema  Pulses:   2+ and symmetric all extremities  Skin:    Skin color, texture, turgor normal, no rashes or lesions  Lymph nodes:   Cervical, supraclavicular, and axillary nodes normal  Neurologic:   CNII-XII intact, normal strength, sensation and reflexes    throughout    Assessment/Plan:   Routine physical examination Today patient counseled on age appropriate routine health concerns for screening and prevention, each reviewed and up to date or declined. Immunizations reviewed and up to date or declined. Labs ordered and reviewed. Risk factors for depression reviewed and negative. Hearing function and visual acuity are intact. ADLs screened and addressed as needed. Functional ability and level of safety reviewed and appropriate. Education, counseling and referrals performed based on assessed risks today. Patient provided with a copy of personalized plan for preventive services.  Malignant neoplasm of upper-outer quadrant of right breast in female, estrogen receptor positive ([place]) Managed by onc Continues on tamoxifen 20 mg daily  Postoperative hypothyroidism Well controlled; managed by dr Chalmers Cater  Overweight Continue with healthy lifestyle and excellent progress   I,Alexander Ruley,acting as a scribe for Inda Coke, PA.,have documented all relevant documentation on the behalf of Inda Coke, PA,as directed by  Inda Coke, PA while in the presence of Inda Coke, [place].   I, Inda Coke, [place], have reviewed all documentation for this visit. The documentation on 01/05/23 for the exam, diagnosis, procedures, and orders are all accurate and complete.  Inda Coke, PA-C [place]

## 2023-01-05 NOTE — Patient Instructions (Signed)
It was great to see you! ? ?Please go to the lab for blood work.  ? ?Our office will call you with your results unless you have chosen to receive results via MyChart. ? ?If your blood work is normal we will follow-up each year for physicals and as scheduled for chronic medical problems. ? ?If anything is abnormal we will treat accordingly and get you in for a follow-up. ? ?Take care, ? ?Pamela Intrieri ?  ? ? ?

## 2023-01-06 ENCOUNTER — Telehealth: Payer: Self-pay | Admitting: Hematology and Oncology

## 2023-01-06 NOTE — Telephone Encounter (Signed)
Rescheduled appointment per patients request. Patient is aware of the changes made to her upcoming appointment. 

## 2023-01-11 ENCOUNTER — Other Ambulatory Visit: Payer: Self-pay | Admitting: *Deleted

## 2023-01-11 ENCOUNTER — Inpatient Hospital Stay: Payer: Managed Care, Other (non HMO) | Admitting: Hematology and Oncology

## 2023-01-11 ENCOUNTER — Encounter: Payer: Self-pay | Admitting: Hematology and Oncology

## 2023-01-11 DIAGNOSIS — C50411 Malignant neoplasm of upper-outer quadrant of right female breast: Secondary | ICD-10-CM

## 2023-01-12 ENCOUNTER — Telehealth: Payer: Self-pay | Admitting: *Deleted

## 2023-01-12 NOTE — Telephone Encounter (Signed)
Left message on voicemail to call office.  

## 2023-01-12 NOTE — Telephone Encounter (Signed)
Spoke to pt told her I messed up her biometric form for [employer] with her husbands. Asked her if she could get me another form? She said she will check with her husband they were sent to him from his work and get back to me. Told her okay.  Called pt back told her I can resend and put error on. She said she spoke to [facility] and they will send new forms to her husband again. Told her okay when he gets them have him send thru My Chart and attach the forms and I can print them out. Pt verbalized understanding.

## 2023-01-13 ENCOUNTER — Encounter: Payer: Self-pay | Admitting: Obstetrics and Gynecology

## 2023-01-17 NOTE — Telephone Encounter (Signed)
Spoke to pt told her I have completed her form but need you to come by and sign the form. Pt verbalized understanding and said she will stop by today or tomorrow. Told her I will put the form at the front desk for her. Pt verbalized understanding.

## 2023-01-18 NOTE — Therapy (Incomplete)
OUTPATIENT PHYSICAL THERAPY  UPPER EXTREMITY ONCOLOGY EVALUATION  Patient Name: Phyllis Harris MRN: [id number] DOB:1981-06-29, 42 y.o., female Today's Date: 01/18/2023  END OF SESSION:   Past Medical History:  Diagnosis Date   Anxiety    Breast cancer ([facility]) 01/23/2021   mastectomy   Cancer ([facility]) 2008   thyroid    Family history of thyroid cancer 01/02/2021   GERD (gastroesophageal reflux disease)    with pregnancy   History of chicken pox    History of radiation therapy 04/17/2021   03/04/2021-04/17/2021  right breast     Dr Antony Blackbird   History of thyroid cancer 01/02/2021   Hypothyroidism    Personal history of radiation therapy    2022   PONV (postoperative nausea and vomiting)    Past Surgical History:  Procedure Laterality Date   AUGMENTATION MAMMAPLASTY Left 09/07/2021   BREAST ENHANCEMENT SURGERY Left 09/07/2021   Procedure: MAMMOPLASTY AUGMENTATION (BREAST) with placement of silicone implant;  Surgeon: Glenna Fellows, MD;  Location: [facility];  Service: Plastics;  Laterality: Left;   BREAST RECONSTRUCTION WITH PLACEMENT OF TISSUE EXPANDER AND ALLODERM Right 01/23/2021   Procedure: BREAST RECONSTRUCTION WITH PLACEMENT OF TISSUE EXPANDER AND ALLODERM;  Surgeon: Glenna Fellows, MD;  Location: [facility];  Service: Plastics;  Laterality: Right;   CESAREAN SECTION  2010   CESAREAN SECTION N/A 05/14/2014   Procedure: CESAREAN SECTION;  Surgeon: Jeani Hawking, MD;  Location: [facility];  Service: Obstetrics;  Laterality: N/A;   DILATION AND CURETTAGE OF UTERUS     miscarriage   MASTECTOMY W/ SENTINEL NODE BIOPSY Right 01/23/2021   Procedure: RIGHT MASTECTOMY WITH AXILLARY SENTINEL LYMPH NODE BIOPSY;  Surgeon: Emelia Loron, MD;  Location: [facility];  Service: General;  Laterality: Right;   RADIOACTIVE SEED GUIDED AXILLARY SENTINEL LYMPH NODE Right 01/23/2021   Procedure: RADIOACTIVE SEED GUIDED RIGHT AXILLARY SENTINEL LYMPH NODE EXCISION;  Surgeon:  Emelia Loron, MD;  Location: [facility];  Service: General;  Laterality: Right;   REMOVAL OF TISSUE EXPANDER AND PLACEMENT OF IMPLANT Right 09/07/2021   Procedure: REMOVAL OF RIGHT CHEST TISSUE EXPANDER AND PLACEMENT OF SILICONE IMPLANT;  Surgeon: Glenna Fellows, MD;  Location: [facility];  Service: Plastics;  Laterality: Right;   THYROIDECTOMY     Patient Active Problem List   Diagnosis Date Noted   S/P mastectomy, right 01/23/2021   Genetic testing 01/09/2021   Family history of thyroid cancer 01/02/2021   History of thyroid cancer 01/02/2021   Malignant neoplasm of upper-outer quadrant of right female breast 12/30/2020   S/P cesarean section 05/14/2014    PCP: Jarold Motto, PA  REFERRING PROVIDER: Dr. Pamelia Hoit  REFERRING DIAG: Rt breast cancer    THERAPY DIAG:  No diagnosis found.  ONSET DATE: 01/23/21  Rationale for Evaluation and Treatment: Rehabilitation  SUBJECTIVE:  SUBJECTIVE STATEMENT: ***  PERTINENT HISTORY: Rt mastectomy with expander 01/23/21 with Dr Dwain Sarna and [place] with 2/4 nodes positive. No chemotherapy needed, but will do doing radiation. (ended 04/17/21)   PAIN:  Are you having pain? {yes/no:20286} NPRS scale: ***/10 Pain location: *** Pain orientation: {Pain Orientation:25161}  PAIN TYPE: {type:313116} Pain description: {PAIN DESCRIPTION:21022940}  Aggravating factors: *** Relieving factors: ***  PRECAUTIONS: Rt lymphedema risk  WEIGHT BEARING RESTRICTIONS: No  FALLS:  Has patient fallen in last 6 months? No  LIVING ENVIRONMENT: Lives with: lives with their family and lives with their spouse and child  OCCUPATION: prek Runner, broadcasting/film/video  LEISURE: ***  HAND DOMINANCE: right   PRIOR LEVEL OF FUNCTION: Independent  PATIENT GOALS:  ***   OBJECTIVE:  COGNITION: Overall cognitive status: Within functional limits for tasks assessed   PALPATION: ***  OBSERVATIONS / OTHER ASSESSMENTS: ***  SENSATION: Light touch: {intact/deficits:24005} Stereognosis: {intact/deficits:24005} Hot/Cold: {intact/deficits:24005} Proprioception: {intact/deficits:24005}  POSTURE: ***  UPPER EXTREMITY AROM/PROM:  A/PROM RIGHT   10/21/21   Shoulder extension 68  Shoulder flexion 158  Shoulder abduction 155  Shoulder internal rotation   Shoulder external rotation 100    (Blank rows = not tested)  CERVICAL AROM: All within normal limits:    Percent limited  Flexion   Extension   Right lateral flexion   Left lateral flexion   Right rotation   Left rotation     UPPER EXTREMITY STRENGTH:    L-DEX LYMPHEDEMA SCREENING:   QUICK DASH SURVEY: ***   TODAY'S TREATMENT:                                                                                                                                          DATE: ***    PATIENT EDUCATION:  Education details: *** Person educated: {Person educated:25204} Education method: {Education Method:25205} Education comprehension: {Education Comprehension:25206}  HOME EXERCISE PROGRAM: ***  ASSESSMENT:  CLINICAL IMPRESSION: Patient is a *** y.o. *** who was seen today for physical therapy evaluation and treatment for ***.    OBJECTIVE IMPAIRMENTS: {opptimpairments:25111}.   ACTIVITY LIMITATIONS: {activitylimitations:27494}  PARTICIPATION LIMITATIONS: {participationrestrictions:25113}  PERSONAL FACTORS: {Personal factors:25162} are also affecting patient's functional outcome.   REHAB POTENTIAL: {rehabpotential:25112}  CLINICAL DECISION MAKING: {clinical decision making:25114}  EVALUATION COMPLEXITY: {Evaluation complexity:25115}  GOALS: Goals reviewed with patient? {yes/no:20286}  SHORT TERM GOALS: Target date: ***  *** Baseline: Goal status:  {GOALSTATUS:25110}  2.  *** Baseline:  Goal status: {GOALSTATUS:25110}  3.  *** Baseline:  Goal status: {GOALSTATUS:25110}  4.  *** Baseline:  Goal status: {GOALSTATUS:25110}  5.  *** Baseline:  Goal status: {GOALSTATUS:25110}  6.  *** Baseline:  Goal status: {GOALSTATUS:25110}  LONG TERM GOALS: Target date: ***  *** Baseline:  Goal status: {GOALSTATUS:25110}  2.  *** Baseline:  Goal status: {GOALSTATUS:25110}  3.  *** Baseline:  Goal status: {GOALSTATUS:25110}  4.  *** Baseline:  Goal status: {GOALSTATUS:25110}  5.  *** Baseline:  Goal status: {GOALSTATUS:25110}  6.  *** Baseline:  Goal status: {GOALSTATUS:25110}  PLAN:  PT FREQUENCY: {rehab frequency:25116}  PT DURATION: {rehab duration:25117}  PLANNED INTERVENTIONS: {rehab planned interventions:25118::"Therapeutic exercises","Therapeutic activity","Neuromuscular re-education","Balance training","Gait training","Patient/Family education","Self Care","Joint mobilization"}  PLAN FOR NEXT SESSION: Idamae Lusher***  Horace Wishon R, PT 01/18/2023, 12:45 PM

## 2023-01-19 ENCOUNTER — Ambulatory Visit: Payer: Managed Care, Other (non HMO) | Attending: Hematology and Oncology | Admitting: Rehabilitation

## 2023-01-19 ENCOUNTER — Encounter: Payer: Self-pay | Admitting: Rehabilitation

## 2023-01-19 ENCOUNTER — Other Ambulatory Visit: Payer: Self-pay

## 2023-01-19 DIAGNOSIS — R293 Abnormal posture: Secondary | ICD-10-CM | POA: Insufficient documentation

## 2023-01-19 DIAGNOSIS — Z483 Aftercare following surgery for neoplasm: Secondary | ICD-10-CM | POA: Diagnosis present

## 2023-01-19 DIAGNOSIS — Z170 Estrogen receptor positive status [ER+]: Secondary | ICD-10-CM | POA: Diagnosis present

## 2023-01-19 DIAGNOSIS — Z9189 Other specified personal risk factors, not elsewhere classified: Secondary | ICD-10-CM | POA: Diagnosis present

## 2023-01-19 DIAGNOSIS — C50411 Malignant neoplasm of upper-outer quadrant of right female breast: Secondary | ICD-10-CM | POA: Insufficient documentation

## 2023-01-21 NOTE — Telephone Encounter (Signed)
Pt came by office yesterday when I was off and signed form. Lucia from front desk faxed form over to [employer].

## 2023-01-27 ENCOUNTER — Ambulatory Visit: Payer: Managed Care, Other (non HMO) | Admitting: Rehabilitation

## 2023-02-03 ENCOUNTER — Ambulatory Visit: Payer: Managed Care, Other (non HMO) | Admitting: Rehabilitative and Restorative Service Providers"

## 2023-02-03 ENCOUNTER — Encounter: Payer: Self-pay | Admitting: Rehabilitative and Restorative Service Providers"

## 2023-02-03 DIAGNOSIS — R293 Abnormal posture: Secondary | ICD-10-CM

## 2023-02-03 DIAGNOSIS — Z9189 Other specified personal risk factors, not elsewhere classified: Secondary | ICD-10-CM

## 2023-02-03 DIAGNOSIS — Z483 Aftercare following surgery for neoplasm: Secondary | ICD-10-CM

## 2023-02-03 DIAGNOSIS — C50411 Malignant neoplasm of upper-outer quadrant of right female breast: Secondary | ICD-10-CM | POA: Diagnosis not present

## 2023-02-03 DIAGNOSIS — Z170 Estrogen receptor positive status [ER+]: Secondary | ICD-10-CM

## 2023-02-03 NOTE — Therapy (Signed)
OUTPATIENT PHYSICAL THERAPY  UPPER EXTREMITY ONCOLOGY TREATMENT NOTE  Patient Name: Phyllis Harris MRN: [id number] DOB:[birth year], 42 y.o., female Today's Date: 02/03/2023  END OF SESSION:  PT End of Session - 02/03/23 0736     Visit Number 2    Date for PT Re-Evaluation 03/30/23    Authorization Type none needed    PT Start Time 0732    PT Stop Time 0800    PT Time Calculation (min) 28 min    Activity Tolerance Patient tolerated treatment well    Behavior During Therapy [facility] for tasks assessed/performed             Past Medical History:  Diagnosis Date   Anxiety    Breast cancer 01/23/2021   mastectomy   Cancer 2008   thyroid    Family history of thyroid cancer 01/02/2021   GERD (gastroesophageal reflux disease)    with pregnancy   History of chicken pox    History of radiation therapy 04/17/2021   03/04/2021-04/17/2021  right breast     Dr Antony Blackbird   History of thyroid cancer 01/02/2021   Hypothyroidism    Personal history of radiation therapy    2022   PONV (postoperative nausea and vomiting)    Past Surgical History:  Procedure Laterality Date   AUGMENTATION MAMMAPLASTY Left 09/07/2021   BREAST ENHANCEMENT SURGERY Left 09/07/2021   Procedure: MAMMOPLASTY AUGMENTATION (BREAST) with placement of silicone implant;  Surgeon: Glenna Fellows, MD;  Location: [facility];  Service: Plastics;  Laterality: Left;   BREAST RECONSTRUCTION WITH PLACEMENT OF TISSUE EXPANDER AND ALLODERM Right 01/23/2021   Procedure: BREAST RECONSTRUCTION WITH PLACEMENT OF TISSUE EXPANDER AND ALLODERM;  Surgeon: Glenna Fellows, MD;  [facility];  Service: Plastics;  Laterality: Right;   CESAREAN SECTION  2010   CESAREAN SECTION N/A 05/14/2014   Procedure: CESAREAN SECTION;  Surgeon: Jeani Hawking, MD;  Location: [facility];  Service: Obstetrics;  Laterality: N/A;   DILATION AND CURETTAGE OF UTERUS     miscarriage   MASTECTOMY W/ SENTINEL NODE BIOPSY Right  01/23/2021   Procedure: RIGHT MASTECTOMY WITH AXILLARY SENTINEL LYMPH NODE BIOPSY;  Surgeon: Emelia Loron, MD;  Location: [facility];  Service: General;  Laterality: Right;   RADIOACTIVE SEED GUIDED AXILLARY SENTINEL LYMPH NODE Right 01/23/2021   Procedure: RADIOACTIVE SEED GUIDED RIGHT AXILLARY SENTINEL LYMPH NODE EXCISION;  Surgeon: Emelia Loron, MD;  Location: [facility];  Service: General;  Laterality: Right;   REMOVAL OF TISSUE EXPANDER AND PLACEMENT OF IMPLANT Right 09/07/2021   Procedure: REMOVAL OF RIGHT CHEST TISSUE EXPANDER AND PLACEMENT OF SILICONE IMPLANT;  Surgeon: Glenna Fellows, MD;  Location: [facility];  Service: Plastics;  Laterality: Right;   THYROIDECTOMY     Patient Active Problem List   Diagnosis Date Noted   S/P mastectomy, right 01/23/2021   Genetic testing 01/09/2021   Family history of thyroid cancer 01/02/2021   History of thyroid cancer 01/02/2021   Malignant neoplasm of upper-outer quadrant of right female breast 12/30/2020   S/P cesarean section 05/14/2014    PCP: Jarold Motto, PA  REFERRING PROVIDER: Dr. Pamelia Hoit  REFERRING DIAG: Rt breast cancer    THERAPY DIAG:  Malignant neoplasm of upper-outer quadrant of right breast in female, estrogen receptor positive  Aftercare following surgery for neoplasm  At risk for lymphedema  Abnormal posture  ONSET DATE: 01/23/21  Rationale for Evaluation and Treatment: Rehabilitation  SUBJECTIVE:  SUBJECTIVE STATEMENT:  Patient reports continued pain.  States that she is ready to try dry needling.  PERTINENT HISTORY: Rt mastectomy with expander 01/23/21 with Dr Dwain Sarna and Thimmappa with 2/4 nodes positive. No chemotherapy needed, but will do doing radiation. (ended 04/17/21)   PAIN:  Are you having pain?  Yes Rating:  4/10 Location:  Right shoulder/breast area Description:  Numbness, "weird pain" When I wake up in the morning it is stiff and sore. (In the Rt shoulder and neck area) It only hurts when I press on the areas.  (The Rt pectoralis and axilla and upper trap)  PRECAUTIONS: Rt lymphedema risk, has implant  WEIGHT BEARING RESTRICTIONS: No  FALLS:  Has patient fallen in last 6 months? No  LIVING ENVIRONMENT: Lives with: lives with their family and lives with their spouse and child  OCCUPATION: prek Runner, broadcasting/film/video  LEISURE: nothing really   HAND DOMINANCE: right   PRIOR LEVEL OF FUNCTION: Independent  PATIENT GOALS: Is there anything I need to do.    OBJECTIVE:  COGNITION: Overall cognitive status: Within functional limits for tasks assessed   PALPATION: Tightness Rt pectoralis, UT with trigger points, latissimus  OBSERVATIONS / OTHER ASSESSMENTS: in supine Rt shoulder sits more anterior and higher by around 2cm, Rt pectoralis appears very tight into the expander with PROM  SENSATION: Light touch: Deficits numb the back of the arm  POSTURE: rounded shoulders  UPPER EXTREMITY AROM/PROM:  A/PROM RIGHT   10/21/21  01/19/23  Shoulder extension 68 70  Shoulder flexion 158 155 - tight lat  Shoulder abduction 155 160  Shoulder internal rotation    Shoulder external rotation 100 90 - pectoralis tightness    (Blank rows = not tested)  CERVICAL AROM:   L-DEX LYMPHEDEMA SCREENING: -1.2 - no signs of UE lymphedema   QUICK DASH SURVEY: 18% from 29% during last POC   TODAY'S TREATMENT:                                                                                                                                          DATE: 02/03/2023 Shoulder pulley x2 min for flexion and 2 min for abduction Doorway stretch for pec 2x20 sec Seated 3 way green pball stretch 3x10 sec each Supine shoulder flexion with 2# on cane 2x10 Trigger Point Dry-Needling  Treatment instructions:  Expect mild to moderate muscle soreness. S/S of pneumothorax if dry needled over a lung field, and to seek immediate medical attention should they occur. Patient verbalized understanding of these instructions and education. Patient Consent Given: Yes Education handout provided: Yes Muscles treated: Right cervical multifidi, right upper trap, right rhomboid Electrical stimulation performed: No Parameters: N/A Treatment response/outcome: Utilized skilled palpation to identify bony landmarks and trigger points.  Able to illicit twitch response and muscle elongation following. Manual Therapy:  following dry needling, used cocoa butter to provide soft tissue mobilization to provide further  muscle elongation to cervical paraspinals, right rhomboid/scapular region, and right upper trap.   DATE: Eval performed Supine over foam roll: - Supine Static Chest Stretch on Foam Roll 90 seconds hold - Hooklying Scapular Protraction on Foam Roll  10 reps - Thoracic Foam Roll Mobilization Backstroke  10 reps - Thoracic Extension Mobilization on Foam Roll  demonstrated but not performed - Sidelying Open Book Thoracic Rotation with Knee on Foam Roll  x 3 - Single Arm Doorway Pec Stretch at 90 Degrees Abduction  x 3  Education on POC and DN Education on time since surgery and how the numbness may be unchanging and that the stiffness may also be a new normal but that she can find a good stretching routine to keep it feeling as good as possible.    PATIENT EDUCATION:  Education details: Per today's note Person educated: Patient Education method: Explanation, Demonstration, Tactile cues, Verbal cues, and Handouts Education comprehension: verbalized understanding and returned demonstration  HOME EXERCISE PROGRAM: Access Code: [email or web address] URL: [email or web address] Date: 01/19/2023 Prepared by: Gwenevere Abbot  Exercises - Supine Static Chest Stretch on Foam Roll  - 1 x daily - 7 x weekly - 1 reps -  20-30 seconds hold - Hooklying Scapular Protraction on Foam Roll  - 1 x daily - 7 x weekly - 10 reps - Thoracic Foam Roll Mobilization Backstroke  - 3 x weekly - 1 sets - 10 reps - Thoracic Extension Mobilization on Foam Roll  - 3 x weekly - 3-5 reps - 5-10 second hold - Sidelying Open Book Thoracic Rotation with Knee on Foam Roll  - 1 x daily - 7 x weekly - 3 reps - 20-30 seconds hold - Single Arm Doorway Pec Stretch at 90 Degrees Abduction  - 1 x daily - 7 x weekly - 2 reps - 20-30 second hold  ASSESSMENT:  CLINICAL IMPRESSION: Ms Dimaano presents to skilled PT reporting that she is ready to try dry needling today.  Patient with good response to dry needling reporting feeling looser following dry needling and manual therapy.  Patient with some pain at end range with exercises.  Patient reports compliance with HEP.  Patient continues to require skilled PT to progress towards goal related activities.  OBJECTIVE IMPAIRMENTS: decreased mobility, decreased ROM, decreased strength, and increased fascial restrictions.   ACTIVITY LIMITATIONS: reach over head  PARTICIPATION LIMITATIONS: community activity  PERSONAL FACTORS: Past/current experiences are also affecting patient's functional outcome.   REHAB POTENTIAL: Excellent  CLINICAL DECISION MAKING: Stable/uncomplicated  EVALUATION COMPLEXITY: Low  GOALS: Goals reviewed with patient? Yes  SHORT TERM GOALS: Target date: 01/19/23  Pt will be ind with initial foam roll HEP Baseline: Goal status: MET   LONG TERM GOALS: Target date: 03/30/23  Pt will improve pain to no increased stiffness in the morning.   Baseline:  Goal status: INITIAL  2.  Pt will feel confident with movement and HEP in regards to her fear of movement.  Baseline:  Goal status: INITIAL  3.  Pt will be ind with final HEP Baseline:  Goal status: INITIAL   PLAN:  PT FREQUENCY: 2x/week  PT DURATION: 6 weeks (longer POC due to busy clinic schedule)  PLANNED  INTERVENTIONS: Therapeutic exercises, Patient/Family education, Self Care, Joint mobilization, Dry Needling, Manual therapy, and Re-evaluation, cupping  PLAN FOR NEXT SESSION: Rt upper quadrant STM, joint mob, cupping, review foam roll exercises, DN upper traps (avoid pectoralis and pt has implant)  ZOXWRUE Mckenzee Beem, PT 02/03/2023, 9:50 AM  [facility] [address] [place], [place] [address] Phone # [phone] Fax [phone]

## 2023-02-03 NOTE — Patient Instructions (Signed)

## 2023-02-04 ENCOUNTER — Telehealth: Payer: Self-pay | Admitting: Hematology and Oncology

## 2023-02-04 NOTE — Telephone Encounter (Signed)
Patient called to move her appointment to the end of June.

## 2023-02-07 ENCOUNTER — Ambulatory Visit: Payer: Managed Care, Other (non HMO) | Admitting: Rehabilitation

## 2023-02-07 ENCOUNTER — Encounter: Payer: Self-pay | Admitting: Rehabilitation

## 2023-02-07 DIAGNOSIS — C50411 Malignant neoplasm of upper-outer quadrant of right female breast: Secondary | ICD-10-CM | POA: Diagnosis not present

## 2023-02-07 DIAGNOSIS — R293 Abnormal posture: Secondary | ICD-10-CM

## 2023-02-07 DIAGNOSIS — Z9189 Other specified personal risk factors, not elsewhere classified: Secondary | ICD-10-CM

## 2023-02-07 DIAGNOSIS — Z170 Estrogen receptor positive status [ER+]: Secondary | ICD-10-CM

## 2023-02-07 DIAGNOSIS — Z483 Aftercare following surgery for neoplasm: Secondary | ICD-10-CM

## 2023-02-07 NOTE — Therapy (Signed)
OUTPATIENT PHYSICAL THERAPY  UPPER EXTREMITY ONCOLOGY TREATMENT NOTE  Patient Name: Phyllis Harris MRN: [id number] DOB:Oct 30, 1980, 42 y.o., female Today's Date: 02/07/2023  END OF SESSION:  PT End of Session - 02/07/23 1656     Visit Number 3    Number of Visits 7    Date for PT Re-Evaluation 03/30/23    PT Start Time 1600    PT Stop Time 1645    PT Time Calculation (min) 45 min    Activity Tolerance Patient tolerated treatment well    Behavior During Therapy [facility] for tasks assessed/performed              Past Medical History:  Diagnosis Date   Anxiety    Breast cancer (HCC) 01/23/2021   mastectomy   Cancer (HCC) 2008   thyroid    Family history of thyroid cancer 01/02/2021   GERD (gastroesophageal reflux disease)    with pregnancy   History of chicken pox    History of radiation therapy 04/17/2021   03/04/2021-04/17/2021  right breast     Dr Antony Blackbird   History of thyroid cancer 01/02/2021   Hypothyroidism    Personal history of radiation therapy    2022   PONV (postoperative nausea and vomiting)    Past Surgical History:  Procedure Laterality Date   AUGMENTATION MAMMAPLASTY Left 09/07/2021   BREAST ENHANCEMENT SURGERY Left 09/07/2021   Procedure: MAMMOPLASTY AUGMENTATION (BREAST) with placement of silicone implant;  Surgeon: Glenna Fellows, MD;  Location: [facility];  Service: Plastics;  Laterality: Left;   BREAST RECONSTRUCTION WITH PLACEMENT OF TISSUE EXPANDER AND ALLODERM Right 01/23/2021   Procedure: BREAST RECONSTRUCTION WITH PLACEMENT OF TISSUE EXPANDER AND ALLODERM;  Surgeon: Glenna Fellows, MD;  [facility];  Service: Plastics;  Laterality: Right;   CESAREAN SECTION  2010   CESAREAN SECTION N/A 05/14/2014   Procedure: CESAREAN SECTION;  Surgeon: Jeani Hawking, MD;  Location: [facility];  Service: Obstetrics;  Laterality: N/A;   DILATION AND CURETTAGE OF UTERUS     miscarriage   MASTECTOMY W/ SENTINEL NODE BIOPSY Right  01/23/2021   Procedure: RIGHT MASTECTOMY WITH AXILLARY SENTINEL LYMPH NODE BIOPSY;  Surgeon: Emelia Loron, MD;  [facility];  Service: General;  Laterality: Right;   RADIOACTIVE SEED GUIDED AXILLARY SENTINEL LYMPH NODE Right 01/23/2021   Procedure: RADIOACTIVE SEED GUIDED RIGHT AXILLARY SENTINEL LYMPH NODE EXCISION;  Surgeon: Emelia Loron, MD;  [facility]: [facility];  Service: General;  Laterality: Right;   REMOVAL OF TISSUE EXPANDER AND PLACEMENT OF IMPLANT Right 09/07/2021   Procedure: REMOVAL OF RIGHT CHEST TISSUE EXPANDER AND PLACEMENT OF SILICONE IMPLANT;  Surgeon: Glenna Fellows, MD;  [facility];  Service: Plastics;  Laterality: Right;   THYROIDECTOMY     Patient Active Problem List   Diagnosis Date Noted   S/P mastectomy, right 01/23/2021   Genetic testing 01/09/2021   Family history of thyroid cancer 01/02/2021   History of thyroid cancer 01/02/2021   Malignant neoplasm of upper-outer quadrant of right female breast (HCC) 12/30/2020   S/P cesarean section 05/14/2014    PCP: Jarold Motto, PA  REFERRING PROVIDER: Dr. Pamelia Hoit  REFERRING DIAG: Rt breast cancer    THERAPY DIAG:  Malignant neoplasm of upper-outer quadrant of right breast in female, estrogen receptor positive [facility])  Aftercare following surgery for neoplasm  At risk for lymphedema  Abnormal posture  ONSET DATE: 01/23/21  Rationale for Evaluation and Treatment: Rehabilitation  SUBJECTIVE:  SUBJECTIVE STATEMENT:  I thought the DN was good.  It helped the next day.    PERTINENT HISTORY: Rt mastectomy with expander 01/23/21 with Dr Dwain Sarna and Thimmappa with 2/4 nodes positive. No chemotherapy needed, but will do doing radiation. (ended 04/17/21)   PAIN:  Are you having pain? Yes Rating:   3/10 Location:  Right shoulder/breast area Description:  Numbness, "weird pain" When I wake up in the morning it is stiff and sore. (In the Rt shoulder and neck area) It only hurts when I press on the areas.  (The Rt pectoralis and axilla and upper trap)  PRECAUTIONS: Rt lymphedema risk, has implant  WEIGHT BEARING RESTRICTIONS: No  FALLS:  Has patient fallen in last 6 months? No  LIVING ENVIRONMENT: Lives with: lives with their family and lives with their spouse and child  OCCUPATION: prek Runner, broadcasting/film/video  LEISURE: nothing really   HAND DOMINANCE: right   PRIOR LEVEL OF FUNCTION: Independent  PATIENT GOALS: Is there anything I need to do.    OBJECTIVE:  COGNITION: Overall cognitive status: Within functional limits for tasks assessed   PALPATION: Tightness Rt pectoralis, UT with trigger points, latissimus  OBSERVATIONS / OTHER ASSESSMENTS: in supine Rt shoulder sits more anterior and higher by around 2cm, Rt pectoralis appears very tight into the expander with PROM  SENSATION: Light touch: Deficits numb the back of the arm  POSTURE: rounded shoulders  UPPER EXTREMITY AROM/PROM:  A/PROM RIGHT   10/21/21  01/19/23  Shoulder extension 68 70  Shoulder flexion 158 155 - tight lat  Shoulder abduction 155 160  Shoulder internal rotation    Shoulder external rotation 100 90 - pectoralis tightness    (Blank rows = not tested)  CERVICAL AROM:   L-DEX LYMPHEDEMA SCREENING: -1.2 - no signs of UE lymphedema   QUICK DASH SURVEY: 18% from 29% during last POC   TODAY'S TREATMENT:                                                                                                                                           DATE 02/07/23:  STM: Rt pectoralis, latissimus, axillary borders in supine and sidelying with PROM as tolerated and positioning into overhead chest stretch propped on 1 pillow.   Cane flexion 6" x 5 Pulleys into abduction x 10  DATE: 02/03/2023 Shoulder pulley x2  min for flexion and 2 min for abduction Doorway stretch for pec 2x20 sec Seated 3 way green pball stretch 3x10 sec each Supine shoulder flexion with 2# on cane 2x10 Trigger Point Dry-Needling  Treatment instructions: Expect mild to moderate muscle soreness. S/S of pneumothorax if dry needled over a lung field, and to seek immediate medical attention should they occur. Patient verbalized understanding of these instructions and education. Patient Consent Given: Yes Education handout provided: Yes Muscles treated: Right cervical multifidi, right upper trap, right rhomboid Electrical stimulation performed: No  Parameters: N/A Treatment response/outcome: Utilized skilled palpation to identify bony landmarks and trigger points.  Able to illicit twitch response and muscle elongation following. Manual Therapy:  following dry needling, used cocoa butter to provide soft tissue mobilization to provide further muscle elongation to cervical paraspinals, right rhomboid/scapular region, and right upper trap.   DATE: Eval performed Supine over foam roll: - Supine Static Chest Stretch on Foam Roll 90 seconds hold - Hooklying Scapular Protraction on Foam Roll  10 reps - Thoracic Foam Roll Mobilization Backstroke  10 reps - Thoracic Extension Mobilization on Foam Roll  demonstrated but not performed - Sidelying Open Book Thoracic Rotation with Knee on Foam Roll  x 3 - Single Arm Doorway Pec Stretch at 90 Degrees Abduction  x 3  Education on POC and DN Education on time since surgery and how the numbness may be unchanging and that the stiffness may also be a new normal but that she can find a good stretching routine to keep it feeling as good as possible.    PATIENT EDUCATION:  Education details: Per today's note Person educated: Patient Education method: Explanation, Demonstration, Tactile cues, Verbal cues, and Handouts Education comprehension: verbalized understanding and returned demonstration  HOME  EXERCISE PROGRAM: Access Code: [email or web address] URL: [email or web address] Date: 01/19/2023 Prepared by: Gwenevere Abbot  Exercises - Supine Static Chest Stretch on Foam Roll  - 1 x daily - 7 x weekly - 1 reps - 20-30 seconds hold - Hooklying Scapular Protraction on Foam Roll  - 1 x daily - 7 x weekly - 10 reps - Thoracic Foam Roll Mobilization Backstroke  - 3 x weekly - 1 sets - 10 reps - Thoracic Extension Mobilization on Foam Roll  - 3 x weekly - 3-5 reps - 5-10 second hold - Sidelying Open Book Thoracic Rotation with Knee on Foam Roll  - 1 x daily - 7 x weekly - 3 reps - 20-30 seconds hold - Single Arm Doorway Pec Stretch at 90 Degrees Abduction  - 1 x daily - 7 x weekly - 2 reps - 20-30 second hold  ASSESSMENT:  CLINICAL IMPRESSION: Pt felt like DN was helpful with the upper neck and back pain but continues to feel extremely tight in the pectoralis and implant region.  Continued POC.   OBJECTIVE IMPAIRMENTS: decreased mobility, decreased ROM, decreased strength, and increased fascial restrictions.   ACTIVITY LIMITATIONS: reach over head  PARTICIPATION LIMITATIONS: community activity  PERSONAL FACTORS: Past/current experiences are also affecting patient's functional outcome.   REHAB POTENTIAL: Excellent  CLINICAL DECISION MAKING: Stable/uncomplicated  EVALUATION COMPLEXITY: Low  GOALS: Goals reviewed with patient? Yes  SHORT TERM GOALS: Target date: 01/19/23  Pt will be ind with initial foam roll HEP Baseline: Goal status: MET   LONG TERM GOALS: Target date: 03/30/23  Pt will improve pain to no increased stiffness in the morning.   Baseline:  Goal status: INITIAL  2.  Pt will feel confident with movement and HEP in regards to her fear of movement.  Baseline:  Goal status: INITIAL  3.  Pt will be ind with final HEP Baseline:  Goal status: INITIAL   PLAN:  PT FREQUENCY: 2x/week  PT DURATION: 6 weeks (longer POC due to busy clinic schedule)  PLANNED  INTERVENTIONS: Therapeutic exercises, Patient/Family education, Self Care, Joint mobilization, Dry Needling, Manual therapy, and Re-evaluation, cupping  PLAN FOR NEXT SESSION: Rt upper quadrant STM, joint mob, cupping, review foam roll exercises, DN (avoid pectoralis and pt has implant)  Devan Babino, Julieanne Manson,  PT 02/07/2023, 4:57 PM  [facility] [address], [address] [place], [place] [address] Phone # [phone] Fax [phone]

## 2023-02-08 ENCOUNTER — Inpatient Hospital Stay: Payer: Managed Care, Other (non HMO) | Admitting: Hematology and Oncology

## 2023-02-17 ENCOUNTER — Ambulatory Visit: Payer: Managed Care, Other (non HMO) | Attending: Hematology and Oncology | Admitting: Physical Therapy

## 2023-02-17 DIAGNOSIS — C50411 Malignant neoplasm of upper-outer quadrant of right female breast: Secondary | ICD-10-CM | POA: Insufficient documentation

## 2023-02-17 DIAGNOSIS — Z483 Aftercare following surgery for neoplasm: Secondary | ICD-10-CM

## 2023-02-17 DIAGNOSIS — Z9189 Other specified personal risk factors, not elsewhere classified: Secondary | ICD-10-CM | POA: Diagnosis present

## 2023-02-17 DIAGNOSIS — Z170 Estrogen receptor positive status [ER+]: Secondary | ICD-10-CM | POA: Diagnosis present

## 2023-02-17 NOTE — Therapy (Addendum)
 OUTPATIENT PHYSICAL THERAPY  UPPER EXTREMITY ONCOLOGY TREATMENT NOTE  Patient Name: Phyllis Harris MRN: [id number] DOB:05/17/1981, 42 y.o., female Today's Date: 02/07/2023  END OF SESSION:  PT End of Session - 02/07/23 1656     Visit Number 3    Number of Visits 7    Date for PT Re-Evaluation 03/30/23    PT Start Time 1600    PT Stop Time 1645    PT Time Calculation (min) 45 min    Activity Tolerance Patient tolerated treatment well    Behavior During Therapy [facility] for tasks assessed/performed              Past Medical History:  Diagnosis Date   Anxiety    Breast cancer (HCC) 01/23/2021   mastectomy   Cancer (HCC) 2008   thyroid    Family history of thyroid cancer 01/02/2021   GERD (gastroesophageal reflux disease)    with pregnancy   History of chicken pox    History of radiation therapy 04/17/2021   03/04/2021-04/17/2021  right breast     Dr Antony Blackbird   History of thyroid cancer 01/02/2021   Hypothyroidism    Personal history of radiation therapy    2022   PONV (postoperative nausea and vomiting)    Past Surgical History:  Procedure Laterality Date   AUGMENTATION MAMMAPLASTY Left 09/07/2021   BREAST ENHANCEMENT SURGERY Left 09/07/2021   Procedure: MAMMOPLASTY AUGMENTATION (BREAST) with placement of silicone implant;  Surgeon: Glenna Fellows, MD;  Location: [facility];  Service: Plastics;  Laterality: Left;   BREAST RECONSTRUCTION WITH PLACEMENT OF TISSUE EXPANDER AND ALLODERM Right 01/23/2021   Procedure: BREAST RECONSTRUCTION WITH PLACEMENT OF TISSUE EXPANDER AND ALLODERM;  Surgeon: Glenna Fellows, MD;  Location: [facility];  Service: Plastics;  Laterality: Right;   CESAREAN SECTION  2010   CESAREAN SECTION N/A 05/14/2014   Procedure: CESAREAN SECTION;  Surgeon: Jeani Hawking, MD;  Location: [facility];  Service: Obstetrics;  Laterality: N/A;   DILATION AND CURETTAGE OF UTERUS     miscarriage   MASTECTOMY W/ SENTINEL NODE BIOPSY Right  01/23/2021   Procedure: RIGHT MASTECTOMY WITH AXILLARY SENTINEL LYMPH NODE BIOPSY;  Surgeon: Emelia Loron, MD;  Location: [facility];  Service: General;  Laterality: Right;   RADIOACTIVE SEED GUIDED AXILLARY SENTINEL LYMPH NODE Right 01/23/2021   Procedure: RADIOACTIVE SEED GUIDED RIGHT AXILLARY SENTINEL LYMPH NODE EXCISION;  Surgeon: Emelia Loron, MD;  Location: [facility];  Service: General;  Laterality: Right;   REMOVAL OF TISSUE EXPANDER AND PLACEMENT OF IMPLANT Right 09/07/2021   Procedure: REMOVAL OF RIGHT CHEST TISSUE EXPANDER AND PLACEMENT OF SILICONE IMPLANT;  Surgeon: Glenna Fellows, MD;  Location: [facility];  Service: Plastics;  Laterality: Right;   THYROIDECTOMY     Patient Active Problem List   Diagnosis Date Noted   S/P mastectomy, right 01/23/2021   Genetic testing 01/09/2021   Family history of thyroid cancer 01/02/2021   History of thyroid cancer 01/02/2021   Malignant neoplasm of upper-outer quadrant of right female breast (HCC) 12/30/2020   S/P cesarean section 05/14/2014    PCP: Jarold Motto, PA  REFERRING PROVIDER: Dr. Pamelia Hoit  REFERRING DIAG: Rt breast cancer    THERAPY DIAG:  Malignant neoplasm of upper-outer quadrant of right breast in female, estrogen receptor positive [facility])  Aftercare following surgery for neoplasm  At risk for lymphedema  Abnormal posture  ONSET DATE: 01/23/21  Rationale for Evaluation and Treatment: Rehabilitation  SUBJECTIVE:  SUBJECTIVE STATEMENT:  Reports regular stretching at home.  States tightness in pectorals persists and some upper trap tightness as well.    PERTINENT HISTORY: Rt mastectomy with expander 01/23/21 with Dr Dwain Sarna and [place] with 2/4 nodes positive. No chemotherapy needed, but will do doing radiation.  (ended 04/17/21)   PAIN:  Are you having pain? Yes Rating:  3/10 Location:  Right shoulder/breast area Description:  Numbness, "weird pain" When I wake up in the morning it is stiff and sore. (In the Rt shoulder and neck area) It only hurts when I press on the areas.  (The Rt pectoralis and axilla and upper trap)  PRECAUTIONS: Rt lymphedema risk, has implant  WEIGHT BEARING RESTRICTIONS: No  FALLS:  Has patient fallen in last 6 months? No  LIVING ENVIRONMENT: Lives with: lives with their family and lives with their spouse and child  OCCUPATION: prek Runner, broadcasting/film/video  LEISURE: nothing really   HAND DOMINANCE: right   PRIOR LEVEL OF FUNCTION: Independent  PATIENT GOALS: Is there anything I need to do.    OBJECTIVE:  COGNITION: Overall cognitive status: Within functional limits for tasks assessed   PALPATION: Tightness Rt pectoralis, UT with trigger points, latissimus  OBSERVATIONS / OTHER ASSESSMENTS: in supine Rt shoulder sits more anterior and higher by around 2cm, Rt pectoralis appears very tight into the expander with PROM  SENSATION: Light touch: Deficits numb the back of the arm  POSTURE: rounded shoulders  UPPER EXTREMITY AROM/PROM:  A/PROM RIGHT   10/21/21  01/19/23 5/9  Shoulder extension 68 70   Shoulder flexion 158 155 - tight lat 155 pre/160 post  Shoulder abduction 155 160 160 pre/165 post  Shoulder internal rotation     Shoulder external rotation 100 90 - pectoralis tightness     (Blank rows = not tested)  CERVICAL AROM:   L-DEX LYMPHEDEMA SCREENING: -1.2 - no signs of UE lymphedema   QUICK DASH SURVEY: 18% from 29% during last POC   TODAY'S TREATMENT:       DATE: 02/17/2023 Right shoulder ROM assessment pre and post DN Review of current HEP Suggested table top flexion, abduction, external ROM sustained holds 30 sec 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 S/S of pneumothorax if dry needled over a lung field, and to seek immediate medical attention should they occur. Patient verbalized understanding of these instructions and education. Patient Consent Given: Yes Education handout provided: Yes Muscles treated: Right cervical multifidi, right upper trap, right levator scap Electrical stimulation performed: No Parameters: N/A Treatment response/outcome: Utilized skilled palpation to identify bony landmarks and trigger points.  Able to illicit twitch response and muscle elongation following. Manual Therapy:   soft tissue mobilization to provide further muscle elongation to cervical paraspinals, right levator scap and right upper trap.  DATE 02/07/23:  STM: Rt pectoralis, latissimus, axillary borders in supine and sidelying with PROM as tolerated and positioning into overhead chest stretch propped on 1 pillow.   Cane flexion 6" x 5 Pulleys into abduction x 10  DATE: 02/03/2023 Shoulder pulley x2 min for flexion and 2 min for abduction Doorway stretch for pec 2x20 sec Seated 3 way green pball stretch 3x10 sec each Supine shoulder flexion with 2# on cane 2x10 Trigger Point Dry-Needling  Treatment instructions: Expect mild to moderate muscle soreness. S/S of pneumothorax if dry needled over a lung field, and to seek immediate medical attention should they occur. Patient verbalized understanding of these instructions and education. Patient Consent Given: Yes Education handout provided: Yes Muscles treated: Right cervical multifidi, right upper trap, right rhomboid Electrical stimulation performed: No Parameters: N/A Treatment response/outcome: Utilized skilled palpation to identify bony  landmarks and trigger points.  Able to illicit twitch response and muscle elongation following. Manual Therapy:  following dry needling, used cocoa butter to provide soft tissue mobilization to provide further muscle elongation to cervical paraspinals, right rhomboid/scapular region, and right upper trap.   DATE: Eval performed Supine over foam roll: - Supine Static Chest Stretch on Foam Roll 90 seconds hold - Hooklying Scapular Protraction on Foam Roll  10 reps - Thoracic Foam Roll Mobilization Backstroke  10 reps - Thoracic Extension Mobilization on Foam Roll  demonstrated but not performed - Sidelying Open Book Thoracic Rotation with Knee on Foam Roll  x 3 - Single Arm Doorway Pec Stretch at 90 Degrees Abduction  x 3  Education on POC and DN Education on time since surgery and how the numbness may be unchanging and that the stiffness may also be a new normal but that she can find a good stretching routine to keep it feeling as good as possible.    PATIENT EDUCATION:  Education details: Per today's note Person educated: Patient Education method: Explanation, Demonstration, Tactile cues, Verbal cues, and Handouts Education comprehension: verbalized understanding and returned demonstration  HOME EXERCISE PROGRAM: Access Code: [email or web address] URL: [email or web address] Date: 01/19/2023 Prepared by: Gwenevere Abbot  Exercises - Supine Static Chest Stretch on Foam Roll  - 1 x daily - 7 x weekly - 1 reps - 20-30 seconds hold - Hooklying Scapular Protraction on Foam Roll  - 1 x daily - 7 x weekly - 10 reps - Thoracic Foam Roll Mobilization Backstroke  - 3 x weekly - 1 sets - 10 reps - Thoracic Extension Mobilization on Foam Roll  - 3 x weekly - 3-5 reps - 5-10 second hold - Sidelying Open Book Thoracic Rotation with Knee on Foam Roll  - 1 x daily - 7 x weekly - 3 reps - 20-30 seconds hold - Single Arm Doorway Pec Stretch at 90 Degrees Abduction  - 1 x daily - 7 x weekly - 2 reps -  20-30 second hold  ASSESSMENT:  CLINICAL IMPRESSION: The patient had numerous muscle twitches produced during dry needling  of upper traps which is a good prognostic indicator for benefit. Some improvement with endrange flexion, abduction, and rotation ROM measurements pre and post DN.  The patient was encouraged in regular performance of HEP post DN including soft tissue lengthening and strengthening exercises to enhance long term benefit.   OBJECTIVE IMPAIRMENTS: decreased mobility, decreased ROM, decreased strength, and increased fascial restrictions.   ACTIVITY LIMITATIONS: reach over head  PARTICIPATION LIMITATIONS: community activity  PERSONAL FACTORS: Past/current experiences are also affecting patient's functional outcome.   REHAB  POTENTIAL: Excellent  CLINICAL DECISION MAKING: Stable/uncomplicated  EVALUATION COMPLEXITY: Low  GOALS: Goals reviewed with patient? Yes  SHORT TERM GOALS: Target date: 01/19/23  Pt will be ind with initial foam roll HEP Baseline: Goal status: MET   LONG TERM GOALS: Target date: 03/30/23  Pt will improve pain to no increased stiffness in the morning.   Baseline:  Goal status: INITIAL  2.  Pt will feel confident with movement and HEP in regards to her fear of movement.  Baseline:  Goal status: INITIAL  3.  Pt will be ind with final HEP Baseline:  Goal status: INITIAL   PLAN:  PT FREQUENCY: 2x/week  PT DURATION: 6 weeks (longer POC due to busy clinic schedule)  PLANNED INTERVENTIONS: Therapeutic exercises, Patient/Family education, Self Care, Joint mobilization, Dry Needling, Manual therapy, and Re-evaluation, cupping  PLAN FOR NEXT SESSION: Rt upper quadrant STM, joint mob, cupping, review foam roll exercises, DN (avoid pectoralis and pt has implant)  Lavinia Sharps, PT 02/17/23 4:21 PM Phone: [phone] Fax: [phone]  [facility] [address] [place], [place] [address] Phone #  [phone] Fax [phone]  PHYSICAL THERAPY DISCHARGE SUMMARY  Visits from Start of Care: 3  Current functional level related to goals / functional outcomes: Per above   Remaining deficits: lymphedema risk   Education / Equipment: Self care HEP   Plan: Patient agrees to discharge.

## 2023-02-24 ENCOUNTER — Ambulatory Visit: Payer: Managed Care, Other (non HMO) | Admitting: Rehabilitation

## 2023-03-03 ENCOUNTER — Ambulatory Visit: Payer: Managed Care, Other (non HMO) | Admitting: Physical Therapy

## 2023-03-18 ENCOUNTER — Other Ambulatory Visit: Payer: Self-pay | Admitting: Hematology and Oncology

## 2023-04-03 NOTE — Progress Notes (Signed)
Patient Care Team: Jarold Motto, [place] as PCP - General (Physician Assistant) Dorisann Frames, MD as Referring Physician (Endocrinology) Harold Hedge, MD as Consulting Physician (Obstetrics and Gynecology) Serena Croissant, MD as Consulting Physician (Hematology and Oncology) Antony Blackbird, MD as Consulting Physician (Radiation Oncology) Glenna Fellows, MD as Consulting Physician (Plastic Surgery) Emelia Loron, MD as Consulting Physician (General Surgery) Causey, Larna Daughters, NP as Nurse Practitioner (Hematology and Oncology) Maryclare Labrador, RN as Registered Nurse  DIAGNOSIS: No diagnosis found.  SUMMARY OF ONCOLOGIC HISTORY: Oncology History  Malignant neoplasm of upper-outer quadrant of right female breast (HCC)  12/30/2020 Initial Diagnosis   A right breast mass was palpated on clinical exam. Diagnostic mammogram and US showed calcifications in the upper outer and upper inner right breast, 9.7cm, and multiple masses from the 9-2 o'clock position, 1.8cm, 3.3cm, 1.1cm, 2.8cm, 0.8cm, and 2 abnormal lymph nodes in the right axilla. Biopsy showed invasive mammary carcinoma in the breast and axilla, grade 2. ER 100%, PR 100%, Ki-67 20%, HER-2 negative   01/01/2021 Cancer Staging   Staging form: Breast, AJCC 8th Edition - Clinical stage from 01/01/2021: Stage IIA (cT3, cN1, cM0, G2, ER+, PR+, HER2-) - Signed by Serena Croissant, MD on 01/01/2021 Stage prefix: Initial diagnosis Histologic grading system: 3 grade system   01/08/2021 Genetic Testing   Negative hereditary cancer genetic testing: no pathogenic variants detected in [facility] Multi-Cancer Panel.  The report date is January 08, 2021.    The Multi-Gene Panel offered by [facility] includes sequencing and/or deletion duplication testing of the following 84 genes: AIP, ALK, APC, ATM, AXIN2,BAP1,  BARD1, BLM, BMPR1A, BRCA1, BRCA2, BRIP1, CASR, CDC73, CDH1, CDK4, CDKN1B, CDKN1C, CDKN2A (p14ARF), CDKN2A (p16INK4a), CEBPA, CHEK2, CTNNA1,  DICER1, DIS3L2, EGFR (c.2369C>T, p.Thr790Met variant only), EPCAM (Deletion/duplication testing only), FH, FLCN, GATA2, GPC3, GREM1 (Promoter region deletion/duplication testing only), HOXB13 (c.251G>A, p.Gly84Glu), HRAS, KIT, MAX, MEN1, MET, MITF (c.952G>A, p.Glu318Lys variant only), MLH1, MSH2, MSH3, MSH6, MUTYH, NBN, NF1, NF2, NTHL1, PALB2, PDGFRA, PHOX2B, PMS2, POLD1, POLE, POT1, PRKAR1A, PTCH1, PTEN, RAD50, RAD51C, RAD51D, RB1, RECQL4, RET, RUNX1, SDHAF2, SDHA (sequence changes only), SDHB, SDHC, SDHD, SMAD4, SMARCA4, SMARCB1, SMARCE1, STK11, SUFU, TERC, TERT, TMEM127, TP53, TSC1, TSC2, VHL, WRN and WT1.    01/23/2021 Surgery   Right mastectomy: Grade 2 IDC, 6.3 cm, intermediate grade DCIS, margins negative, carcinoma involves the dermis of the nipple, 1 intramammary lymph node positive, 2 additional lymph nodes in the right axilla positive out of 4 ER 100%, PR 100%, HER2 negative, Ki-67 20% T3N1A   02/02/2021 Cancer Staging   Staging form: Breast, AJCC 8th Edition - Pathologic stage from 02/02/2021: Stage IB (pT3, pN1a, cM0, G2, ER+, PR+, HER2-) - Signed by Serena Croissant, MD on 02/02/2021 Histologic grading system: 3 grade system   03/04/2021 - 04/17/2021 Radiation Therapy   Site Technique Total Dose (Gy) Dose per Fx (Gy) Completed Fx Beam Energies  Chest Wall, Right: CW_Rt 3D 50/50 2 25/25 10X  Sclav-RT: SCV_Rt 3D 50/50 2 25/25 6X, 10X  Chest Wall, Right: CW_Rt_Bst Electron 10/10 2 5/5 6E      05/2021 -  Anti-estrogen oral therapy   Tamoxifen daily     CHIEF COMPLIANT:   INTERVAL HISTORY: Phyllis Harris is a   ALLERGIES:  has No Known Allergies.  MEDICATIONS:  Current Outpatient Medications  Medication Sig Dispense Refill   acetaminophen (TYLENOL) 500 MG tablet Take 1,000 mg by mouth as needed.     levonorgestrel (MIRENA) 20 MCG/24HR IUD 1 each by Intrauterine route once.  Inserted by GYN Dr. Henderson Cloud 11/04/2017, needs to be removed in 10/2022.     levothyroxine (SYNTHROID) 112 MCG  tablet Take 224 mcg by mouth daily. Take 224 mcg daily 6 days a week. Take 112 mcg the other day of the week.     Multiple Vitamin (MULTIVITAMIN WITH MINERALS) TABS tablet Take 1 tablet by mouth in the morning.     tamoxifen (NOLVADEX) 20 MG tablet TAKE 1 TABLET BY MOUTH EVERY DAY 30 tablet 2   triamcinolone cream (KENALOG) 0.1 % Apply 1 Application topically as needed.     No current facility-administered medications for this visit.    PHYSICAL EXAMINATION: ECOG PERFORMANCE STATUS: {CHL ONC ECOG PS:[phone]}  There were no vitals filed for this visit. There were no vitals filed for this visit.  BREAST:*** No palpable masses or nodules in either right or left breasts. No palpable axillary supraclavicular or infraclavicular adenopathy no breast tenderness or nipple discharge. (exam performed in the presence of a chaperone)  LABORATORY DATA:  I have reviewed the data as listed    Latest Ref Rng & Units 01/05/2023    9:05 AM 01/21/2021    8:32 AM 01/01/2021    2:56 PM  CMP  Glucose 70 - 99 mg/dL 272  536  94   BUN 6 - 23 mg/dL 11  9  11    Creatinine 0.40 - 1.20 mg/dL 6.44  0.34  7.42   Sodium 135 - 145 mEq/L 140  141  142   Potassium 3.5 - 5.1 mEq/L 4.2  4.3  4.2   Chloride 96 - 112 mEq/L 106  107  103   CO2 19 - 32 mEq/L 25  26  27    Calcium 8.4 - 10.5 mg/dL 8.7  8.2  8.9   Total Protein 6.0 - 8.3 g/dL 6.9   7.8   Total Bilirubin 0.2 - 1.2 mg/dL 0.4   0.3   Alkaline Phos 39 - 117 U/L 52   65   AST 0 - 37 U/L 30   25   ALT 0 - 35 U/L 35   23     Lab Results  Component Value Date   WBC 5.9 01/05/2023   HGB 13.3 01/05/2023   HCT 39.5 01/05/2023   MCV 89.2 01/05/2023   PLT 214.0 01/05/2023   NEUTROABS 4.0 01/05/2023    ASSESSMENT & PLAN:  No problem-specific Assessment & Plan notes found for this encounter.    No orders of the defined types were placed in this encounter.  The patient has a good understanding of the overall plan. she agrees with it. she will call with  any problems that may develop before the next visit here. Total time spent: 30 mins including face to face time and time spent for planning, charting and co-ordination of care   Sherlyn Lick, CMA 04/03/23    I Janan Ridge am acting as a Neurosurgeon for [employer]  ***

## 2023-04-05 ENCOUNTER — Other Ambulatory Visit: Payer: Self-pay

## 2023-04-05 ENCOUNTER — Inpatient Hospital Stay: Payer: Managed Care, Other (non HMO) | Attending: Hematology and Oncology | Admitting: Hematology and Oncology

## 2023-04-05 VITALS — BP 123/60 | HR 72 | Temp 97.9°F | Resp 18 | Ht 68.5 in | Wt 195.7 lb

## 2023-04-05 DIAGNOSIS — Z79810 Long term (current) use of selective estrogen receptor modulators (SERMs): Secondary | ICD-10-CM | POA: Diagnosis not present

## 2023-04-05 DIAGNOSIS — C50411 Malignant neoplasm of upper-outer quadrant of right female breast: Secondary | ICD-10-CM | POA: Diagnosis present

## 2023-04-05 DIAGNOSIS — Z170 Estrogen receptor positive status [ER+]: Secondary | ICD-10-CM | POA: Insufficient documentation

## 2023-04-05 MED ORDER — TAMOXIFEN CITRATE 20 MG PO TABS: 20.0000 mg | ORAL_TABLET | Freq: Every day | ORAL | 3 refills | Status: DC

## 2023-04-05 NOTE — Assessment & Plan Note (Addendum)
01/23/2021: Right mastectomy: Grade 2 IDC, 6.3 cm, intermediate grade DCIS, margins negative, carcinoma involves the dermis of the nipple, 1 intramammary lymph node positive, 2 additional lymph nodes in the right axilla positive out of 4 ER 100%, PR 100%, HER2 negative, Ki-67 20% T3N1A stage Ib   Recommendation: 1.  MammaPrint: 02/22/2021: Low risk 2. adjuvant radiation 03/05/2021-04/17/2021 3.  Follow-up adjuvant antiestrogen therapy with tamoxifen  (premenopausal based on FSH 4.9, estradiol 46.2) x10 years 4.  Recommended abemaciclib adjuvant therapy based on [facility] clinical trial x2 years (patient declined) ------------------------------------------------------------------------------------------------------------------------ Current treatment: Tamoxifen started July 2022 Tamoxifen toxicities: Weight gain Mild hot flashes  Breast cancer surveillance: Breast exam 04/05/2023: Benign Mammogram 01/04/2023: Benign breast density category B  Return to clinic in 1 year for follow-up

## 2023-05-09 ENCOUNTER — Telehealth (INDEPENDENT_AMBULATORY_CARE_PROVIDER_SITE_OTHER): Payer: Managed Care, Other (non HMO) | Admitting: Physician Assistant

## 2023-05-09 ENCOUNTER — Encounter: Payer: Self-pay | Admitting: Physician Assistant

## 2023-05-09 DIAGNOSIS — J0100 Acute maxillary sinusitis, unspecified: Secondary | ICD-10-CM | POA: Diagnosis not present

## 2023-05-09 MED ORDER — AMOXICILLIN-POT CLAVULANATE 875-125 MG PO TABS: 1.0000 | ORAL_TABLET | Freq: Two times a day (BID) | ORAL | 0 refills | Status: DC

## 2023-05-09 NOTE — Progress Notes (Signed)
   Virtual Visit via Video Note   I, Jarold Motto, connected with  Phyllis Harris  ([id number], 1981-04-25) on 05/09/23 at 11:20 AM EDT by a video-enabled telemedicine application and verified that I am speaking with the correct person using two identifiers.  Location: Patient: Home Provider: [place] [facility] [place] office   I discussed the limitations of evaluation and management by telemedicine and the availability of in person appointments. The patient expressed understanding and agreed to proceed.    History of Present Illness: Phyllis Harris is a 42 y.o. who identifies as a female who was assigned female at birth, and is being seen today for cough/sinus infection.  Patient reports 5 day history of sinus pressure, cough, nasal congestion. Denies: fever, chills  Has negative COVID test  She and her family recently relocated and she has been very busy with moving and unpacking, not getting much rest  She has not tried anything for her symptoms  Typically gets a "summer sinus infection" which presents similarly  Problems:  Patient Active Problem List   Diagnosis Date Noted   S/P mastectomy, right 01/23/2021   Genetic testing 01/09/2021   Family history of thyroid cancer 01/02/2021   History of thyroid cancer 01/02/2021   Malignant neoplasm of upper-outer quadrant of right female breast (HCC) 12/30/2020   S/P cesarean section 05/14/2014    Allergies: No Known Allergies Medications:  Current Outpatient Medications:    amoxicillin-clavulanate (AUGMENTIN) 875-125 MG tablet, Take 1 tablet by mouth 2 (two) times daily., Disp: 14 tablet, Rfl: 0   levonorgestrel (MIRENA) 20 MCG/24HR IUD, 1 each by Intrauterine route once. Inserted by GYN Dr. Henderson Cloud 11/04/2017, needs to be removed in 10/2022., Disp: , Rfl:    levothyroxine (SYNTHROID) 112 MCG tablet, Take 224 mcg by mouth daily. Take 224 mcg daily 6 days a week. Take 112 mcg the other day of the week., Disp: , Rfl:    Multiple  Vitamin (MULTIVITAMIN WITH MINERALS) TABS tablet, Take 1 tablet by mouth in the morning., Disp: , Rfl:    Multiple Vitamin (MULTIVITAMIN) capsule, Orally, Disp: , Rfl:    tamoxifen (NOLVADEX) 20 MG tablet, Take 1 tablet (20 mg total) by mouth daily., Disp: 90 tablet, Rfl: 3   triamcinolone cream (KENALOG) 0.1 %, Apply 1 Application topically as needed., Disp: , Rfl:   Observations/Objective: Patient is well-developed, well-nourished in no acute distress.  Resting comfortably  at home.  Head is normocephalic, atraumatic.  No labored breathing.  Speech is clear and coherent with logical content.  Patient is alert and oriented at baseline.   Assessment and Plan: 1. Acute maxillary sinusitis, recurrence not specified No red flags on exam.  Will initiate augmentin per orders. Discussed taking medications as prescribed. Reviewed return precautions including worsening fever, SOB, worsening cough or other concerns. Push fluids and rest. I recommend that patient follow-up if symptoms worsen or persist despite treatment x 7-10 days, sooner if needed.   Follow Up Instructions: I discussed the assessment and treatment plan with the patient. The patient was provided an opportunity to ask questions and all were answered. The patient agreed with the plan and demonstrated an understanding of the instructions.  A copy of instructions were sent to the patient via MyChart unless otherwise noted below.   The patient was advised to call back or seek an in-person evaluation if the symptoms worsen or if the condition fails to improve as anticipated.  Jarold Motto, [place]

## 2023-05-18 ENCOUNTER — Encounter: Payer: Self-pay | Admitting: Physician Assistant

## 2023-06-21 ENCOUNTER — Encounter: Payer: Self-pay | Admitting: Hematology and Oncology

## 2023-07-10 ENCOUNTER — Encounter: Payer: Self-pay | Admitting: Physician Assistant

## 2023-07-14 ENCOUNTER — Ambulatory Visit: Payer: Managed Care, Other (non HMO) | Admitting: Physician Assistant

## 2023-09-30 ENCOUNTER — Telehealth: Payer: Managed Care, Other (non HMO) | Admitting: Physician Assistant

## 2023-11-16 ENCOUNTER — Encounter: Payer: Self-pay | Admitting: Physician Assistant

## 2023-11-16 MED ORDER — OSELTAMIVIR PHOSPHATE 75 MG PO CAPS: 75.0000 mg | ORAL_CAPSULE | Freq: Every day | ORAL | 0 refills | Status: DC

## 2023-11-16 NOTE — Addendum Note (Signed)
 Addended by: Winona Haw on: 11/16/2023 04:17 PM   Modules accepted: Orders

## 2023-11-23 ENCOUNTER — Other Ambulatory Visit: Payer: Self-pay | Admitting: Physician Assistant

## 2023-11-23 DIAGNOSIS — Z1231 Encounter for screening mammogram for malignant neoplasm of breast: Secondary | ICD-10-CM

## 2023-11-25 ENCOUNTER — Ambulatory Visit (INDEPENDENT_AMBULATORY_CARE_PROVIDER_SITE_OTHER): Payer: Managed Care, Other (non HMO) | Admitting: Physician Assistant

## 2023-11-25 ENCOUNTER — Encounter: Payer: Self-pay | Admitting: Physician Assistant

## 2023-11-25 VITALS — BP 120/74 | HR 66 | Temp 98.0°F | Ht 68.5 in | Wt 202.5 lb

## 2023-11-25 DIAGNOSIS — R051 Acute cough: Secondary | ICD-10-CM

## 2023-11-25 DIAGNOSIS — Z1211 Encounter for screening for malignant neoplasm of colon: Secondary | ICD-10-CM | POA: Diagnosis not present

## 2023-11-25 MED ORDER — AZITHROMYCIN 250 MG PO TABS: ORAL_TABLET | ORAL | 0 refills | Status: AC

## 2023-11-25 MED ORDER — PREDNISONE 20 MG PO TABS: 20.0000 mg | ORAL_TABLET | Freq: Two times a day (BID) | ORAL | 0 refills | Status: DC

## 2023-11-25 NOTE — Progress Notes (Signed)
Phyllis Harris is a 43 y.o. female here for a new problem.  History of Present Illness:   Chief Complaint  Patient presents with   Cough    Pt c/o lingering cough x 2 weeks, had Flu last week. Has tried everything OTC Mucinex etc.    HPI  Cough: Pt complains of a lingering cough, fever, and associated chest soreness ongoing for 2 weeks.  Tested positive for the flu with an at-home test after known exposure from her daughter. She took Tamiflu as prescribed. Had a fever of 100.  She endorses some chest soreness which she attributes to her constant coughing fits.  States she can hear constant chest crackles from coughing.  Has tried multiple OTC medications including Mucinex.    Past Medical History:  Diagnosis Date   Anxiety    Breast cancer (HCC) 01/23/2021   mastectomy   Cancer (HCC) 2008   thyroid    Family history of thyroid cancer 01/02/2021   GERD (gastroesophageal reflux disease)    with pregnancy   History of chicken pox    History of radiation therapy 04/17/2021   03/04/2021-04/17/2021  right breast     Dr Antony Blackbird   History of thyroid cancer 01/02/2021   Hypothyroidism    Personal history of radiation therapy    2022   PONV (postoperative nausea and vomiting)      Social History   Tobacco Use   Smoking status: Never   Smokeless tobacco: Never  Vaping Use   Vaping status: Never Used  Substance Use Topics   Alcohol use: Yes    Comment: occassional   Drug use: No    Past Surgical History:  Procedure Laterality Date   AUGMENTATION MAMMAPLASTY Left 09/07/2021   BREAST ENHANCEMENT SURGERY Left 09/07/2021   Procedure: MAMMOPLASTY AUGMENTATION (BREAST) with placement of silicone implant;  Surgeon: Glenna Fellows, MD;  Location: [facility];  Service: Plastics;  Laterality: Left;   BREAST RECONSTRUCTION WITH PLACEMENT OF TISSUE EXPANDER AND ALLODERM Right 01/23/2021   Procedure: BREAST RECONSTRUCTION WITH PLACEMENT OF TISSUE EXPANDER AND  ALLODERM;  Surgeon: Glenna Fellows, MD;  Location: [facility];  Service: Plastics;  Laterality: Right;   CESAREAN SECTION  2010   CESAREAN SECTION N/A 05/14/2014   Procedure: CESAREAN SECTION;  Surgeon: Jeani Hawking, MD;  Location: [facility];  Service: Obstetrics;  Laterality: N/A;   DILATION AND CURETTAGE OF UTERUS     miscarriage   MASTECTOMY W/ SENTINEL NODE BIOPSY Right 01/23/2021   Procedure: RIGHT MASTECTOMY WITH AXILLARY SENTINEL LYMPH NODE BIOPSY;  Surgeon: Emelia Loron, MD;  Location: [facility];  Service: General;  Laterality: Right;   RADIOACTIVE SEED GUIDED AXILLARY SENTINEL LYMPH NODE Right 01/23/2021   Procedure: RADIOACTIVE SEED GUIDED RIGHT AXILLARY SENTINEL LYMPH NODE EXCISION;  Surgeon: Emelia Loron, MD;  Location: [facility];  Service: General;  Laterality: Right;   REMOVAL OF TISSUE EXPANDER AND PLACEMENT OF IMPLANT Right 09/07/2021   Procedure: REMOVAL OF RIGHT CHEST TISSUE EXPANDER AND PLACEMENT OF SILICONE IMPLANT;  Surgeon: Glenna Fellows, MD;  Location: [facility];  Service: Plastics;  Laterality: Right;   THYROIDECTOMY      Family History  Problem Relation Age of Onset   Thyroid cancer Mother        anaplastic; dx late 13s   Diabetes Mother    Hyperlipidemia Mother    Kidney disease Mother    COPD Father    Hearing loss Father    Non-Hodgkin's lymphoma Maternal  Grandmother        d.89   Heart attack Maternal Grandfather    Heart attack Paternal Grandfather    Colon cancer Neg Hx     No Known Allergies  Current Medications:   Current Outpatient Medications:    azithromycin (ZITHROMAX) 250 MG tablet, Take 2 tablets on day 1, then 1 tablet daily on days 2 through 5, Disp: 6 tablet, Rfl: 0   citalopram (CELEXA) 20 MG tablet, Take 20 mg by mouth daily., Disp: , Rfl:    levonorgestrel (MIRENA) 20 MCG/24HR IUD, 1 each by Intrauterine route once. Inserted by GYN Dr. Henderson Cloud 11/04/2017, needs to be removed in 10/2022., Disp: , Rfl:     levothyroxine (SYNTHROID) 112 MCG tablet, Take 224 mcg by mouth daily. Take 224 mcg daily 6 days a week. Take 112 mcg the other day of the week., Disp: , Rfl:    Multiple Vitamin (MULTIVITAMIN WITH MINERALS) TABS tablet, Take 1 tablet by mouth in the morning., Disp: , Rfl:    Multiple Vitamin (MULTIVITAMIN) capsule, Orally, Disp: , Rfl:    predniSONE (DELTASONE) 20 MG tablet, Take 1 tablet (20 mg total) by mouth 2 (two) times daily with a meal., Disp: 10 tablet, Rfl: 0   tamoxifen (NOLVADEX) 20 MG tablet, Take 1 tablet (20 mg total) by mouth daily., Disp: 90 tablet, Rfl: 3   triamcinolone cream (KENALOG) 0.1 %, Apply 1 Application topically as needed., Disp: , Rfl:    Review of Systems:   Negative unless otherwise specified per HPI.  Vitals:   Vitals:   11/25/23 1307  BP: 120/74  Pulse: 66  Temp: 98 F (36.7 C)  TempSrc: Temporal  SpO2: 97%  Weight: 202 lb 8 oz (91.9 kg)  Height: 5' 8.5" (1.74 m)     Body mass index is 30.34 kg/m.  Physical Exam:   Physical Exam Vitals and nursing note reviewed.  Constitutional:      General: She is not in acute distress.    Appearance: She is well-developed. She is not ill-appearing or toxic-appearing.  HENT:     Head: Normocephalic and atraumatic.     Right Ear: Tympanic membrane, ear canal and external ear normal. Tympanic membrane is not erythematous, retracted or bulging.     Left Ear: Tympanic membrane, ear canal and external ear normal. Tympanic membrane is not erythematous, retracted or bulging.     Nose: Nose normal.     Right Sinus: No maxillary sinus tenderness or frontal sinus tenderness.     Left Sinus: No maxillary sinus tenderness or frontal sinus tenderness.     Mouth/Throat:     Pharynx: Uvula midline. No posterior oropharyngeal erythema.  Eyes:     General: Lids are normal.     Conjunctiva/sclera: Conjunctivae normal.  Neck:     Trachea: Trachea normal.  Cardiovascular:     Rate and Rhythm: Normal rate and regular  rhythm.     Heart sounds: Normal heart sounds, S1 normal and S2 normal.  Pulmonary:     Effort: Pulmonary effort is normal.     Breath sounds: Rhonchi present. No decreased breath sounds, wheezing or rales.  Lymphadenopathy:     Cervical: No cervical adenopathy.  Skin:    General: Skin is warm and dry.  Neurological:     Mental Status: She is alert.  Psychiatric:        Speech: Speech normal.        Behavior: Behavior normal. Behavior is cooperative.  Assessment and Plan:   1. Acute cough (Primary) No red flags on exam.   Will initiate azithromycin and prednisone for her suspected bronchitis per orders.  Discussed taking medications as prescribed.  Reviewed return precautions including new or worsening fever, SOB, new or worsening cough or other concerns.  Push fluids and rest.  I recommend that patient follow-up if symptoms worsen or persist despite treatment x 7-10 days, sooner if needed.  2. Special screening for malignant neoplasms, colon - Ambulatory referral to Gastroenterology    I, Isabelle Course, acting as a scribe for Jarold Motto, [place]., have documented all relevant documentation on the behalf of Jarold Motto, [place], as directed by  Jarold Motto, PA while in the presence of Jarold Motto, [place].  I, Jarold Motto, [place], have reviewed all documentation for this visit. The documentation on 11/25/23 for the exam, diagnosis, procedures, and orders are all accurate and complete.  Jarold Motto, PA-C

## 2023-11-28 MED ORDER — PREDNISONE 20 MG PO TABS: 20.0000 mg | ORAL_TABLET | Freq: Two times a day (BID) | ORAL | 0 refills | Status: DC

## 2024-01-05 ENCOUNTER — Ambulatory Visit
Admission: RE | Admit: 2024-01-05 | Discharge: 2024-01-05 | Disposition: A | Discharge status: Home / Self Care | Payer: Managed Care, Other (non HMO) | Source: Ambulatory Visit | Attending: Physician Assistant

## 2024-01-05 DIAGNOSIS — Z1231 Encounter for screening mammogram for malignant neoplasm of breast: Secondary | ICD-10-CM

## 2024-02-02 ENCOUNTER — Telehealth: Admitting: Physician Assistant

## 2024-04-02 ENCOUNTER — Encounter: Payer: Self-pay | Admitting: Physician Assistant

## 2024-04-02 ENCOUNTER — Ambulatory Visit: Admitting: Physician Assistant

## 2024-04-02 ENCOUNTER — Ambulatory Visit

## 2024-04-02 VITALS — BP 110/80 | HR 69 | Temp 97.9°F | Ht 68.5 in | Wt 213.4 lb

## 2024-04-02 DIAGNOSIS — E669 Obesity, unspecified: Secondary | ICD-10-CM

## 2024-04-02 DIAGNOSIS — M533 Sacrococcygeal disorders, not elsewhere classified: Secondary | ICD-10-CM

## 2024-04-02 MED ORDER — ZEPBOUND 2.5 MG/0.5ML ~~LOC~~ SOAJ
2.5000 mg | SUBCUTANEOUS | 0 refills | Status: DC
Start: 2024-04-02 — End: 2024-04-04

## 2024-04-02 MED ORDER — MELOXICAM 15 MG PO TABS
ORAL_TABLET | ORAL | 0 refills | Status: DC
Start: 2024-04-02 — End: 2024-06-04

## 2024-04-02 NOTE — Progress Notes (Signed)
 Phyllis Harris is a 43 y.o. female here for a new problem.  History of Present Illness:   Chief Complaint  Patient presents with   Tailbone Pain    Pt fell 2 weeks ago down last 2 wooden steps, having tailbone pain, Tylenol  not helping.    HPI  Tailbone pain Pt complains of tailbone pain starting 2 weeks ago.  She states she fell after misjudging her steps on the staircase at her new house due to the same color of the floor and stairs. She reports pain when lying in bed, difficulty sleeping, and discomfort during movement. Endorses alternating 3 Ibuprofen  every 4 hours with 2 Tylenol , with minimal clinical effect. Denies bruising or pain during bowel movements. Pt had a bone density exam done recently with normal results. Does have history of breast cancer, currently taking tamoxifen    Weight management Pt reports difficulty losing weight ever since her two pregnancies. She endorses following [employer] for quite some time and is regularly eating protein bars and shakes with one meal of lean meat and green vegetables per day. She exercises most days of the week for at least 30-45 minutes She reports this diet makes her feel deprived. Notes personal hx of papillary thyroid  cancer  Denies Fhx of medullary thyroid  cancer. Pt is inquiring about weight loss medication.  Past Medical History:  Diagnosis Date   Anxiety    Breast cancer (HCC) 01/23/2021   mastectomy   Cancer (HCC) 2008   thyroid     Family history of thyroid  cancer 01/02/2021   GERD (gastroesophageal reflux disease)    with pregnancy   History of chicken pox    History of radiation therapy 04/17/2021   03/04/2021-04/17/2021  right breast     Dr Lynwood Nasuti   History of thyroid  cancer 01/02/2021   Hypothyroidism    Personal history of radiation therapy    2022   PONV (postoperative nausea and vomiting)      Social History   Tobacco Use   Smoking status: Never   Smokeless tobacco: Never  Vaping Use    Vaping status: Never Used  Substance Use Topics   Alcohol use: Yes    Comment: occassional   Drug use: No    Past Surgical History:  Procedure Laterality Date   AUGMENTATION MAMMAPLASTY Left 09/07/2021   BREAST ENHANCEMENT SURGERY Left 09/07/2021   Procedure: MAMMOPLASTY AUGMENTATION (BREAST) with placement of silicone implant;  Surgeon: Arelia Filippo, MD;  Location: [facility];  Service: Plastics;  Laterality: Left;   BREAST RECONSTRUCTION WITH PLACEMENT OF TISSUE EXPANDER AND ALLODERM Right 01/23/2021   Procedure: BREAST RECONSTRUCTION WITH PLACEMENT OF TISSUE EXPANDER AND ALLODERM;  Surgeon: Arelia Filippo, MD;  Location: [facility];  Service: Plastics;  Laterality: Right;   CESAREAN SECTION  2010   CESAREAN SECTION N/A 05/14/2014   Procedure: CESAREAN SECTION;  Surgeon: Rosaline LITTIE Cobble, MD;  Location: [facility];  Service: Obstetrics;  Laterality: N/A;   DILATION AND CURETTAGE OF UTERUS     miscarriage   MASTECTOMY Right 2022   MASTECTOMY W/ SENTINEL NODE BIOPSY Right 01/23/2021   Procedure: RIGHT MASTECTOMY WITH AXILLARY SENTINEL LYMPH NODE BIOPSY;  Surgeon: Ebbie Cough, MD;  Location: [facility];  Service: General;  Laterality: Right;   RADIOACTIVE SEED GUIDED AXILLARY SENTINEL LYMPH NODE Right 01/23/2021   Procedure: RADIOACTIVE SEED GUIDED RIGHT AXILLARY SENTINEL LYMPH NODE EXCISION;  Surgeon: Ebbie Cough, MD;  Location: [facility];  Service: General;  Laterality: Right;   REMOVAL OF TISSUE  EXPANDER AND PLACEMENT OF IMPLANT Right 09/07/2021   Procedure: REMOVAL OF RIGHT CHEST TISSUE EXPANDER AND PLACEMENT OF SILICONE IMPLANT;  Surgeon: Arelia Filippo, MD;  Location: [facility];  Service: Plastics;  Laterality: Right;   THYROIDECTOMY      Family History  Problem Relation Age of Onset   Thyroid  cancer Mother        anaplastic; dx late 51s   Diabetes Mother    Hyperlipidemia Mother    Kidney disease Mother    COPD Father    Hearing loss  Father    Non-Hodgkin's lymphoma Maternal Grandmother        d.89   Heart attack Maternal Grandfather    Heart attack Paternal Grandfather    Colon cancer Neg Hx    BRCA 1/2 Neg Hx    Breast cancer Neg Hx     No Known Allergies  Current Medications:   Current Outpatient Medications:    citalopram (CELEXA) 10 MG tablet, Take 10 mg by mouth daily., Disp: , Rfl:    levonorgestrel  (MIRENA ) 20 MCG/24HR IUD, 1 each by Intrauterine route once. Inserted by GYN Dr. Tomblin 11/04/2017, needs to be removed in 10/2022., Disp: , Rfl:    levothyroxine  (SYNTHROID ) 112 MCG tablet, Take 224 mcg by mouth daily. Take 224 mcg daily 6 days a week. Take 112 mcg the other day of the week., Disp: , Rfl:    meloxicam (MOBIC) 15 MG tablet, Take 1 tablet daily x 1 week, then as needed after that., Disp: 30 tablet, Rfl: 0   Multiple Vitamin (MULTIVITAMIN) capsule, Orally, Disp: , Rfl:    tamoxifen  (NOLVADEX ) 20 MG tablet, Take 1 tablet (20 mg total) by mouth daily., Disp: 90 tablet, Rfl: 3   tirzepatide (ZEPBOUND) 2.5 MG/0.5ML Pen, Inject 2.5 mg into the skin once a week., Disp: 2 mL, Rfl: 0   triamcinolone cream (KENALOG) 0.1 %, Apply 1 Application topically as needed., Disp: , Rfl:    Review of Systems:   Negative unless otherwise specified per HPI.  Vitals:   Vitals:   04/02/24 1117  BP: 110/80  Pulse: 69  Temp: 97.9 F (36.6 C)  TempSrc: Temporal  SpO2: 97%  Weight: 213 lb 6.1 oz (96.8 kg)  Height: 5' 8.5 (1.74 m)     Body mass index is 31.97 kg/m.  Physical Exam:   Physical Exam Constitutional:      Appearance: Normal appearance. She is well-developed.  HENT:     Head: Normocephalic and atraumatic.   Eyes:     General: Lids are normal.     Extraocular Movements: Extraocular movements intact.     Conjunctiva/sclera: Conjunctivae normal.   Pulmonary:     Effort: Pulmonary effort is normal.   Musculoskeletal:        General: Normal range of motion.     Cervical back: Normal range  of motion and neck supple.       Back:   Skin:    General: Skin is warm and dry.   Neurological:     Mental Status: She is alert and oriented to person, place, and time.   Psychiatric:        Attention and Perception: Attention and perception normal.        Mood and Affect: Mood normal.        Behavior: Behavior normal.        Thought Content: Thought content normal.        Judgment: Judgment normal.  Assessment and Plan:   1. Sacral pain (Primary) - DG Sacrum/Coccyx; Future  Suspect bony contusion Start meloxicam 15 mg daily x 1 week, then as needed after that Avoid additional anti-inflammatories while on this May take tylenol  We will get xray today given ongoing symptom(s), history of breast cancer, use of tamoxifen  Use topical Voltaren gel if needed (generic is fine!) Consider follow up with sports medicine if symptom(s) worsen  2. Obesity, unspecified class, unspecified obesity type, unspecified whether serious comorbidity present  Continue current efforts with weight loss including stress relief, controlled calorie intake and regular exercise I have asked her to confirm with her endo provider the safety of GLP-1 with her cancer history I have sent in Zepbound for her Follow up in 3 month(s), sooner if concerns   I, Lavern Simmers, acting as a Neurosurgeon for [employer], [place]., have documented all relevant documentation on the behalf of Lucie Buttner, [place], as directed by  Lucie Buttner, PA while in the presence of Lucie Buttner, [place].  I, Lucie Buttner, [place], have reviewed all documentation for this visit. The documentation on 04/02/24 for the exam, diagnosis, procedures, and orders are all accurate and complete.  Lucie Buttner, PA-C

## 2024-04-02 NOTE — Patient Instructions (Signed)
 It was great to see you!  Please ask Dr Tommas if GLP-1 is safe for you -- we will send this in. Follow up in 3 month(s) after starting  Start meloxicam 15 mg daily x 1 week, then as needed after that Avoid additional anti-inflammatories while on this May take tylenol  We will get xray today Use topical Voltaren gel if needed (generic is fine!)  Voltaren Gel is available over the counter.   You are to apply this gel to your injured body part twice daily (morning and evening).   A little goes a long way so you can use about a pea-sized amount for each area.  Spread this small amount over the area into a thin film and let it dry.  Be sure that you do not rub the gel into your skin for more than 10 or 15 seconds otherwise it can irritate you skin.   Once you apply the gel, please do not put any other lotion or clothing in contact with that area for 30 minutes to allow the gel to absorb into your skin.   Some people are sensitive to the medication and can develop a sunburn-like rash.  If you have only mild symptoms it is okay to continue to use the medication but if you have any breakdown of your skin you should discontinue its use and please let us  know.      Take care,  Lucie Buttner PA-C

## 2024-04-03 ENCOUNTER — Telehealth: Payer: Self-pay | Admitting: *Deleted

## 2024-04-03 ENCOUNTER — Other Ambulatory Visit (HOSPITAL_COMMUNITY): Payer: Self-pay

## 2024-04-03 ENCOUNTER — Telehealth: Payer: Self-pay

## 2024-04-03 NOTE — Telephone Encounter (Signed)
Please do PA for Zepbound 2.5 mg.

## 2024-04-03 NOTE — Telephone Encounter (Signed)
 Pharmacy Patient Advocate Encounter   Received notification from Patient Advice Request messages that prior authorization for Zepbound 2.5MG /0.5ML pen-injectors is required/requested.   Insurance verification completed.   The patient is insured through [facility] .   Per test claim:  WEGOVY is preferred by the insurance.  If suggested medication is appropriate, Please send in a new RX and discontinue this one. If not, please advise as to why it's not appropriate so that we may request a Prior Authorization. Please note, some preferred medications may still require a PA.  If the suggested medications have not been trialed and there are no contraindications to their use, the PA will not be submitted, as it will not be approved.

## 2024-04-04 ENCOUNTER — Other Ambulatory Visit (HOSPITAL_COMMUNITY): Payer: Self-pay

## 2024-04-04 ENCOUNTER — Inpatient Hospital Stay: Payer: Managed Care, Other (non HMO) | Attending: Hematology and Oncology | Admitting: Hematology and Oncology

## 2024-04-04 VITALS — BP 137/74 | HR 60 | Temp 98.6°F | Resp 17 | Wt 213.5 lb

## 2024-04-04 DIAGNOSIS — Z79810 Long term (current) use of selective estrogen receptor modulators (SERMs): Secondary | ICD-10-CM | POA: Insufficient documentation

## 2024-04-04 DIAGNOSIS — Z170 Estrogen receptor positive status [ER+]: Secondary | ICD-10-CM | POA: Diagnosis not present

## 2024-04-04 DIAGNOSIS — C50411 Malignant neoplasm of upper-outer quadrant of right female breast: Secondary | ICD-10-CM | POA: Diagnosis not present

## 2024-04-04 MED ORDER — WEGOVY 0.25 MG/0.5ML ~~LOC~~ SOAJ: 0.2500 mg | SUBCUTANEOUS | 0 refills | Status: DC

## 2024-04-04 MED ORDER — TAMOXIFEN CITRATE 20 MG PO TABS
20.0000 mg | ORAL_TABLET | Freq: Every day | ORAL | 3 refills | Status: AC
Start: 2024-04-04 — End: ?

## 2024-04-04 NOTE — Telephone Encounter (Signed)
 Please do PA for Wegovy  0.25 mg. Thanks

## 2024-04-04 NOTE — Telephone Encounter (Signed)
 Left detailed message on personal voicemail, I am calling about your message and I did hear back from insurance and Zepbound is not covered they prefer you try Wegovy. I am sending a PA for the [facility] and when I hear back from insurance I will let you know. Please do not pickup Rx for Zepbound. Any questions call office.

## 2024-04-04 NOTE — Progress Notes (Signed)
 Patient Care Team: Job Lukes, [place] as PCP - General (Physician Assistant) Tommas Pears, MD as Referring Physician (Endocrinology) Curlene Agent, MD as Consulting Physician (Obstetrics and Gynecology) Odean Potts, MD as Consulting Physician (Hematology and Oncology) Shannon Agent, MD as Consulting Physician (Radiation Oncology) Arelia Filippo, MD as Consulting Physician (Plastic Surgery) Ebbie Cough, MD as Consulting Physician (General Surgery) Causey, Morna Pickle, NP as Nurse Practitioner (Hematology and Oncology) Dove, Natro D, RN as Registered Nurse  DIAGNOSIS:  Encounter Diagnosis  Name Primary?   Malignant neoplasm of upper-outer quadrant of right breast in female, estrogen receptor positive (HCC) Yes    SUMMARY OF ONCOLOGIC HISTORY: Oncology History  Malignant neoplasm of upper-outer quadrant of right female breast (HCC)  12/30/2020 Initial Diagnosis   A right breast mass was palpated on clinical exam. Diagnostic mammogram and US  showed calcifications in the upper outer and upper inner right breast, 9.7cm, and multiple masses from the 9-2 o'clock position, 1.8cm, 3.3cm, 1.1cm, 2.8cm, 0.8cm, and 2 abnormal lymph nodes in the right axilla. Biopsy showed invasive mammary carcinoma in the breast and axilla, grade 2. ER 100%, PR 100%, Ki-67 20%, HER-2 negative   01/01/2021 Cancer Staging   Staging form: Breast, AJCC 8th Edition - Clinical stage from 01/01/2021: Stage IIA (cT3, cN1, cM0, G2, ER+, PR+, HER2-) - Signed by Odean Potts, MD on 01/01/2021 Stage prefix: Initial diagnosis Histologic grading system: 3 grade system   01/08/2021 Genetic Testing   Negative hereditary cancer genetic testing: no pathogenic variants detected in [facility] Multi-Cancer Panel.  The report date is January 08, 2021.    The Multi-Gene Panel offered by [facility] includes sequencing and/or deletion duplication testing of the following 84 genes: AIP, ALK, APC, ATM, AXIN2,BAP1,  BARD1,  BLM, BMPR1A, BRCA1, BRCA2, BRIP1, CASR, CDC73, CDH1, CDK4, CDKN1B, CDKN1C, CDKN2A (p14ARF), CDKN2A (p16INK4a), CEBPA, CHEK2, CTNNA1, DICER1, DIS3L2, EGFR (c.2369C>T, p.Thr790Met variant only), EPCAM (Deletion/duplication testing only), FH, FLCN, GATA2, GPC3, GREM1 (Promoter region deletion/duplication testing only), HOXB13 (c.251G>A, p.Gly84Glu), HRAS, KIT, MAX, MEN1, MET, MITF (c.952G>A, p.Glu318Lys variant only), MLH1, MSH2, MSH3, MSH6, MUTYH, NBN, NF1, NF2, NTHL1, PALB2, PDGFRA, PHOX2B, PMS2, POLD1, POLE, POT1, PRKAR1A, PTCH1, PTEN, RAD50, RAD51C, RAD51D, RB1, RECQL4, RET, RUNX1, SDHAF2, SDHA (sequence changes only), SDHB, SDHC, SDHD, SMAD4, SMARCA4, SMARCB1, SMARCE1, STK11, SUFU, TERC, TERT, TMEM127, TP53, TSC1, TSC2, VHL, WRN and WT1.    01/23/2021 Surgery   Right mastectomy: Grade 2 IDC, 6.3 cm, intermediate grade DCIS, margins negative, carcinoma involves the dermis of the nipple, 1 intramammary lymph node positive, 2 additional lymph nodes in the right axilla positive out of 4 ER 100%, PR 100%, HER2 negative, Ki-67 20% T3N1A   02/02/2021 Cancer Staging   Staging form: Breast, AJCC 8th Edition - Pathologic stage from 02/02/2021: Stage IB (pT3, pN1a, cM0, G2, ER+, PR+, HER2-) - Signed by Odean Potts, MD on 02/02/2021 Histologic grading system: 3 grade system   03/04/2021 - 04/17/2021 Radiation Therapy   Site Technique Total Dose (Gy) Dose per Fx (Gy) Completed Fx Beam Energies  Chest Wall, Right: CW_Rt 3D 50/50 2 25/25 10X  Sclav-RT: SCV_Rt 3D 50/50 2 25/25 6X, 10X  Chest Wall, Right: CW_Rt_Bst Electron 10/10 2 5/5 6E      05/2021 -  Anti-estrogen oral therapy   Tamoxifen  daily     CHIEF COMPLIANT: Follow-up on tamoxifen   HISTORY OF PRESENT ILLNESS:   History of Present Illness Phyllis Harris is a 43 year old female with a history of thyroid  and breast cancer who presents for a  follow-up regarding weight management and medication review.  She struggles with weight management despite  significant efforts, having lost fifty pounds last summer through a regimen of bars and shakes, which was unsustainable. She is considering a weight loss injection to manage 'food noise'.  She is on liothyronine and tamoxifen , which she believes contribute to her difficulty in losing weight. She has been on tamoxifen  for three years, experiencing joint pain and mild hot flashes but no major side effects. Regular mammograms are maintained, with the most recent in March showing normal results.  A recent bone density test was conducted due to concerns about the effects of tamoxifen  and levothyroxine  on her bone health.     ALLERGIES:  has no known allergies.  MEDICATIONS:  Current Outpatient Medications  Medication Sig Dispense Refill   citalopram (CELEXA) 10 MG tablet Take 10 mg by mouth daily.     levonorgestrel  (MIRENA ) 20 MCG/24HR IUD 1 each by Intrauterine route once. Inserted by GYN Dr. Tomblin 11/04/2017, needs to be removed in 10/2022.     levothyroxine  (SYNTHROID ) 112 MCG tablet Take 224 mcg by mouth daily. Take 224 mcg daily 6 days a week. Take 112 mcg the other day of the week.     meloxicam (MOBIC) 15 MG tablet Take 1 tablet daily x 1 week, then as needed after that. 30 tablet 0   Multiple Vitamin (MULTIVITAMIN) capsule Orally     Semaglutide-Weight Management (WEGOVY) 0.25 MG/0.5ML SOAJ Inject 0.25 mg into the skin once a week. 2 mL 0   tamoxifen  (NOLVADEX ) 20 MG tablet Take 1 tablet (20 mg total) by mouth daily. 90 tablet 3   triamcinolone cream (KENALOG) 0.1 % Apply 1 Application topically as needed.     No current facility-administered medications for this visit.    PHYSICAL EXAMINATION: ECOG PERFORMANCE STATUS: 1 - Symptomatic but completely ambulatory  Vitals:   04/04/24 1515  BP: 137/74  Pulse: 60  Resp: 17  Temp: 98.6 F (37 C)  SpO2: 100%   Filed Weights   04/04/24 1515  Weight: 213 lb 8 oz (96.8 kg)      LABORATORY DATA:  I have reviewed the data as  listed    Latest Ref Rng & Units 01/05/2023    9:05 AM 01/21/2021    8:32 AM 01/01/2021    2:56 PM  CMP  Glucose 70 - 99 mg/dL 895  887  94   BUN 6 - 23 mg/dL 11  9  11    Creatinine 0.40 - 1.20 mg/dL 9.30  9.24  9.23   Sodium 135 - 145 mEq/L 140  141  142   Potassium 3.5 - 5.1 mEq/L 4.2  4.3  4.2   Chloride 96 - 112 mEq/L 106  107  103   CO2 19 - 32 mEq/L 25  26  27    Calcium 8.4 - 10.5 mg/dL 8.7  8.2  8.9   Total Protein 6.0 - 8.3 g/dL 6.9   7.8   Total Bilirubin 0.2 - 1.2 mg/dL 0.4   0.3   Alkaline Phos 39 - 117 U/L 52   65   AST 0 - 37 U/L 30   25   ALT 0 - 35 U/L 35   23     Lab Results  Component Value Date   WBC 5.9 01/05/2023   HGB 13.3 01/05/2023   HCT 39.5 01/05/2023   MCV 89.2 01/05/2023   PLT 214.0 01/05/2023   NEUTROABS 4.0 01/05/2023    ASSESSMENT &  PLAN:  Malignant neoplasm of upper-outer quadrant of right female breast (HCC) 01/23/2021: Right mastectomy: Grade 2 IDC, 6.3 cm, intermediate grade DCIS, margins negative, carcinoma involves the dermis of the nipple, 1 intramammary lymph node positive, 2 additional lymph nodes in the right axilla positive out of 4 ER 100%, PR 100%, HER2 negative, Ki-67 20% T3N1A stage Ib   Recommendation: 1.  MammaPrint: 02/22/2021: Low risk 2. adjuvant radiation 03/05/2021-04/17/2021 3.  Follow-up adjuvant antiestrogen therapy with tamoxifen   (premenopausal based on FSH 4.9, estradiol  46.2) x10 years 4.  Recommended abemaciclib adjuvant therapy based on [facility] E clinical trial x2 years (patient declined) ------------------------------------------------------------------------------------------------------------------------ Current treatment: Tamoxifen  started July 2022 Tamoxifen  toxicities: Weight gain: She lost 50 pounds on [employer] diet but when she stopped the diet she has gained 15 pounds back. Mild hot flashes   Breast cancer surveillance: Breast exam 04/04/2024: Benign Mammogram 01/09/2024: Benign breast density category B    Return to clinic in 1 year for follow-up ------------------------------------- Assessment and Plan Assessment & Plan Malignant neoplasm of upper-outer quadrant of right breast Estrogen receptor-positive breast cancer managed with tamoxifen  for three years. Mammograms normal, improved breast density. Tamoxifen  benefits bone density. - Continue tamoxifen  20 mg orally daily. - Send a year's worth of refills to [facility] and [facility]. - Plan tamoxifen  therapy for at least five years, reassess continuation then. - Ensure regular mammograms.  Weight management Considering Tzhncb for weight loss pending insurance approval. Discussed benefits and concerns of Wegovy. Advised on hydration, protein intake, and exercise. 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 - Monitor weight and dietary habits. - Ensure adequate hydration and protein intake. - Continue regular exercise.      No orders of the defined types were placed in this encounter.  The patient has a good understanding of the overall plan. she agrees with it. she will call with any problems that may develop before the next visit here. Total time spent: 30 mins including face to face time and time spent for planning, charting and co-ordination of care   Phyllis K Sherlyn Ebbert, MD 04/04/24

## 2024-04-04 NOTE — Telephone Encounter (Signed)
 Spoke to pt told her per PA Team, I will need the patient to upload third party coverage into her chart, or if that is incorrect she will need to call medicaid and inform them she no longer has it. I can't submit until we have because they will automatically deny.  Pt verbalized understanding and said she does not have medicaid, she will contact them and also try to find out if there is numbers to go with the Rx plan and get back to us . Told her okay.

## 2024-04-04 NOTE — Assessment & Plan Note (Signed)
 01/23/2021: Right mastectomy: Grade 2 IDC, 6.3 cm, intermediate grade DCIS, margins negative, carcinoma involves the dermis of the nipple, 1 intramammary lymph node positive, 2 additional lymph nodes in the right axilla positive out of 4 ER 100%, PR 100%, HER2 negative, Ki-67 20% T3N1A stage Ib   Recommendation: 1.  MammaPrint: 02/22/2021: Low risk 2. adjuvant radiation 03/05/2021-04/17/2021 3.  Follow-up adjuvant antiestrogen therapy with tamoxifen   (premenopausal based on FSH 4.9, estradiol  46.2) x10 years 4.  Recommended abemaciclib adjuvant therapy based on [facility] E clinical trial x2 years (patient declined) ------------------------------------------------------------------------------------------------------------------------ Current treatment: Tamoxifen  started July 2022 Tamoxifen  toxicities: Weight gain: She lost 50 pounds on [employer] diet but when she stopped the diet she has gained 15 pounds back. Mild hot flashes   Breast cancer surveillance: Breast exam 04/04/2024: Benign Mammogram 01/09/2024: Benign breast density category B   Return to clinic in 1 year for follow-up

## 2024-04-05 ENCOUNTER — Other Ambulatory Visit (HOSPITAL_COMMUNITY): Payer: Self-pay

## 2024-04-05 ENCOUNTER — Telehealth: Payer: Self-pay

## 2024-04-05 NOTE — Telephone Encounter (Signed)
 Pharmacy Patient Advocate Encounter   Received notification from RX Request Messages that prior authorization for [facility] 0.25MG /0.5ML  is required/requested.   Insurance verification completed.   The patient is insured through [facility] .   Per test claim: PA required; PA submitted to above mentioned insurance via CoverMyMeds Key/confirmation #/EOC [id number] Status is pending

## 2024-04-06 ENCOUNTER — Other Ambulatory Visit (HOSPITAL_COMMUNITY): Payer: Self-pay

## 2024-04-06 NOTE — Telephone Encounter (Signed)
 Pharmacy Patient Advocate Encounter  Received notification from [employer] that Prior Authorization for WEGOVY  0.25/0.5ML has been APPROVED from 04/05/24 to 10/05/24   PA #/Case ID/Reference #: [id number]

## 2024-04-09 ENCOUNTER — Other Ambulatory Visit (HOSPITAL_COMMUNITY): Payer: Self-pay

## 2024-04-09 ENCOUNTER — Ambulatory Visit: Payer: Self-pay | Admitting: Physician Assistant

## 2024-04-12 ENCOUNTER — Other Ambulatory Visit (HOSPITAL_COMMUNITY): Payer: Self-pay

## 2024-04-25 NOTE — Telephone Encounter (Signed)
 Left detailed message on personal voicemail, Phyllis Harris said Everyone is different -- I would ask to make sure she feels like she is administering it correctly.   We can go ahead and send in the increased dosage for her to start after she has completed her 4th shot if she would like. Please call me back so we can make sure you are administering it correctly.

## 2024-04-26 MED ORDER — WEGOVY 0.5 MG/0.5ML ~~LOC~~ SOAJ
0.5000 mg | SUBCUTANEOUS | 0 refills | Status: DC
Start: 2024-04-26 — End: 2024-05-28

## 2024-04-26 NOTE — Addendum Note (Signed)
 Addended by: THURMON ARLAND PARAS on: 04/26/2024 02:32 PM   Modules accepted: Orders

## 2024-04-26 NOTE — Telephone Encounter (Signed)
 Spoke to pt asked her is she received my message I left? Pt said she deleted it before listening to it. Told her Phyllis Harris said everyone is different how much they lose with the medication. She wasnted to make sure you are giving yourself the injection correctly. Asked pt to explain to me how she is giving her shot? Pt said she is giving it in her abdomen. Are you giving it subcutaneously and on an angle? Pt said yes. Told her will send next dose but will need to complete all 4 shots from first round. Pt verbalized understanding. Rx sent to pharmacy.

## 2024-05-26 ENCOUNTER — Other Ambulatory Visit: Payer: Self-pay | Admitting: Physician Assistant

## 2024-05-28 NOTE — Telephone Encounter (Signed)
 Last OV 04/02/24 Next OV not scheduled  Last refill 04/26/24 Qty # 2mL / 0

## 2024-06-04 ENCOUNTER — Other Ambulatory Visit: Payer: Self-pay | Admitting: Physician Assistant

## 2024-06-17 ENCOUNTER — Encounter: Payer: Self-pay | Admitting: Physician Assistant

## 2024-06-18 MED ORDER — WEGOVY 1 MG/0.5ML ~~LOC~~ SOAJ: 1.0000 mg | SUBCUTANEOUS | 0 refills | Status: DC

## 2024-06-27 ENCOUNTER — Encounter: Payer: Self-pay | Admitting: *Deleted

## 2024-06-27 ENCOUNTER — Encounter: Payer: Self-pay | Admitting: Hematology and Oncology

## 2024-06-27 ENCOUNTER — Telehealth: Payer: Self-pay | Admitting: Adult Health

## 2024-06-27 NOTE — Progress Notes (Signed)
 Received mychart message from pt with complaint of hip pain and joint pain while on Tamoxifen .  Per MD pt needing to stop Tamoxifen  x2 weeks and f/u in office.  Pt educated and verbalized understanding. Message sent to scheduling team

## 2024-06-27 NOTE — Telephone Encounter (Signed)
 left vm for pt about scheduled appt date and time

## 2024-07-17 MED ORDER — ONDANSETRON 4 MG PO TBDP: 4.0000 mg | ORAL_TABLET | Freq: Three times a day (TID) | ORAL | 0 refills | Status: DC | PRN

## 2024-07-17 NOTE — Addendum Note (Signed)
 Addended by: THURMON ARLAND PARAS on: 07/17/2024 07:55 AM   Modules accepted: Orders

## 2024-07-17 NOTE — Telephone Encounter (Signed)
 Please see message. Okay to prescribe Zofran ?

## 2024-07-18 ENCOUNTER — Inpatient Hospital Stay: Admitting: Adult Health

## 2024-07-19 ENCOUNTER — Encounter: Admitting: Adult Health

## 2024-07-23 ENCOUNTER — Other Ambulatory Visit: Payer: Self-pay | Admitting: Physician Assistant

## 2024-07-25 ENCOUNTER — Telehealth: Payer: Self-pay

## 2024-07-25 ENCOUNTER — Encounter: Admitting: Adult Health

## 2024-07-25 ENCOUNTER — Other Ambulatory Visit (HOSPITAL_COMMUNITY): Payer: Self-pay

## 2024-07-25 NOTE — Telephone Encounter (Signed)
 Pharmacy Patient Advocate Encounter   Received notification from [employer] that prior authorization for Wegovy  1 mg/0.5 ml auto injectors is required/requested.   Insurance verification completed.   The patient is insured through [employer].   Per test claim, medication is not covered due to plan/benefit exclusion, PA not submitted at this time

## 2024-07-26 ENCOUNTER — Other Ambulatory Visit: Payer: Self-pay | Admitting: Physician Assistant

## 2024-07-26 ENCOUNTER — Inpatient Hospital Stay: Attending: Hematology and Oncology | Admitting: Hematology and Oncology

## 2024-07-26 VITALS — BP 120/67 | HR 75 | Temp 98.1°F | Resp 17 | Ht 68.5 in | Wt 204.9 lb

## 2024-07-26 DIAGNOSIS — C50411 Malignant neoplasm of upper-outer quadrant of right female breast: Secondary | ICD-10-CM | POA: Insufficient documentation

## 2024-07-26 DIAGNOSIS — Z17411 Hormone receptor positive with human epidermal growth factor receptor 2 negative status: Secondary | ICD-10-CM | POA: Diagnosis not present

## 2024-07-26 DIAGNOSIS — C773 Secondary and unspecified malignant neoplasm of axilla and upper limb lymph nodes: Secondary | ICD-10-CM | POA: Diagnosis not present

## 2024-07-26 DIAGNOSIS — Z79810 Long term (current) use of selective estrogen receptor modulators (SERMs): Secondary | ICD-10-CM | POA: Insufficient documentation

## 2024-07-26 DIAGNOSIS — Z170 Estrogen receptor positive status [ER+]: Secondary | ICD-10-CM | POA: Diagnosis not present

## 2024-07-26 NOTE — Progress Notes (Signed)
 Patient Care Team: Job Lukes, [place] as PCP - General (Physician Assistant) Tommas Pears, MD as Referring Physician (Endocrinology) Curlene Agent, MD as Consulting Physician (Obstetrics and Gynecology) Odean Potts, MD as Consulting Physician (Hematology and Oncology) Shannon Agent, MD as Consulting Physician (Radiation Oncology) Arelia Filippo, MD as Consulting Physician (Plastic Surgery) Ebbie Cough, MD as Consulting Physician (General Surgery) Causey, Morna Pickle, NP as Nurse Practitioner (Hematology and Oncology) Dove, Natro D, RN as Registered Nurse  DIAGNOSIS:  Encounter Diagnosis  Name Primary?   Malignant neoplasm of upper-outer quadrant of right breast in female, estrogen receptor positive (HCC) Yes    SUMMARY OF ONCOLOGIC HISTORY: Oncology History  Malignant neoplasm of upper-outer quadrant of right female breast (HCC)  12/30/2020 Initial Diagnosis   A right breast mass was palpated on clinical exam. Diagnostic mammogram and US  showed calcifications in the upper outer and upper inner right breast, 9.7cm, and multiple masses from the 9-2 o'clock position, 1.8cm, 3.3cm, 1.1cm, 2.8cm, 0.8cm, and 2 abnormal lymph nodes in the right axilla. Biopsy showed invasive mammary carcinoma in the breast and axilla, grade 2. ER 100%, PR 100%, Ki-67 20%, HER-2 negative   01/01/2021 Cancer Staging   Staging form: Breast, AJCC 8th Edition - Clinical stage from 01/01/2021: Stage IIA (cT3, cN1, cM0, G2, ER+, PR+, HER2-) - Signed by Odean Potts, MD on 01/01/2021 Stage prefix: Initial diagnosis Histologic grading system: 3 grade system   01/08/2021 Genetic Testing   Negative hereditary cancer genetic testing: no pathogenic variants detected in [facility] Multi-Cancer Panel.  The report date is January 08, 2021.    The Multi-Gene Panel offered by [facility] includes sequencing and/or deletion duplication testing of the following 84 genes: AIP, ALK, APC, ATM, AXIN2,BAP1,  BARD1,  BLM, BMPR1A, BRCA1, BRCA2, BRIP1, CASR, CDC73, CDH1, CDK4, CDKN1B, CDKN1C, CDKN2A (p14ARF), CDKN2A (p16INK4a), CEBPA, CHEK2, CTNNA1, DICER1, DIS3L2, EGFR (c.2369C>T, p.Thr790Met variant only), EPCAM (Deletion/duplication testing only), FH, FLCN, GATA2, GPC3, GREM1 (Promoter region deletion/duplication testing only), HOXB13 (c.251G>A, p.Gly84Glu), HRAS, KIT, MAX, MEN1, MET, MITF (c.952G>A, p.Glu318Lys variant only), MLH1, MSH2, MSH3, MSH6, MUTYH, NBN, NF1, NF2, NTHL1, PALB2, PDGFRA, PHOX2B, PMS2, POLD1, POLE, POT1, PRKAR1A, PTCH1, PTEN, RAD50, RAD51C, RAD51D, RB1, RECQL4, RET, RUNX1, SDHAF2, SDHA (sequence changes only), SDHB, SDHC, SDHD, SMAD4, SMARCA4, SMARCB1, SMARCE1, STK11, SUFU, TERC, TERT, TMEM127, TP53, TSC1, TSC2, VHL, WRN and WT1.    01/23/2021 Surgery   Right mastectomy: Grade 2 IDC, 6.3 cm, intermediate grade DCIS, margins negative, carcinoma involves the dermis of the nipple, 1 intramammary lymph node positive, 2 additional lymph nodes in the right axilla positive out of 4 ER 100%, PR 100%, HER2 negative, Ki-67 20% T3N1A   02/02/2021 Cancer Staging   Staging form: Breast, AJCC 8th Edition - Pathologic stage from 02/02/2021: Stage IB (pT3, pN1a, cM0, G2, ER+, PR+, HER2-) - Signed by Odean Potts, MD on 02/02/2021 Histologic grading system: 3 grade system   03/04/2021 - 04/17/2021 Radiation Therapy   Site Technique Total Dose (Gy) Dose per Fx (Gy) Completed Fx Beam Energies  Chest Wall, Right: CW_Rt 3D 50/50 2 25/25 10X  Sclav-RT: SCV_Rt 3D 50/50 2 25/25 6X, 10X  Chest Wall, Right: CW_Rt_Bst Electron 10/10 2 5/5 6E      05/2021 -  Anti-estrogen oral therapy   Tamoxifen  daily     CHIEF COMPLIANT: Bilateral hip and body pains  HISTORY OF PRESENT ILLNESS:   History of Present Illness Phyllis Harris is a 43 year old female with a history of thyroid  cancer who presents with hip  joint pain.  She experiences significant aching hip joint pain during the day and night, extending to her legs  and ankles. 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  She underwent a thyroidectomy at age 60 for thyroid  cancer and has been on tamoxifen  for three years. She stopped tamoxifen  a month ago for two weeks without improvement in her symptoms. Her family history includes her mother, who recently passed away from anaplastic thyroid  cancer with bone and brain metastases, and her father, who has lung cancer.  A recent bone density test returned normal results. She has been taking Tylenol  Arthritis for pain relief but is concerned about long-term use.     ALLERGIES:  has no known allergies.  MEDICATIONS:  Current Outpatient Medications  Medication Sig Dispense Refill   citalopram (CELEXA) 10 MG tablet Take 10 mg by mouth daily.     levonorgestrel  (MIRENA ) 20 MCG/24HR IUD 1 each by Intrauterine route once. Inserted by GYN Dr. Tomblin 11/04/2017, needs to be removed in 10/2022.     levothyroxine  (SYNTHROID ) 112 MCG tablet Take 224 mcg by mouth daily. Take 224 mcg daily 6 days a week. Take 112 mcg the other day of the week.     meloxicam  (MOBIC ) 15 MG tablet TAKE 1 TABLET DAILY X 1 WEEK, THEN AS NEEDED AFTER THAT. 30 tablet 0   Multiple Vitamin (MULTIVITAMIN) capsule Orally     ondansetron  (ZOFRAN -ODT) 4 MG disintegrating tablet Take 1 tablet (4 mg total) by mouth every 8 (eight) hours as needed for nausea or vomiting. 20 tablet 0   semaglutide -weight management (WEGOVY ) 1 MG/0.5ML SOAJ SQ injection INJECT 1MG  INTO THE SKIN ONCE A WEEK 0.5 mL 1   tamoxifen  (NOLVADEX ) 20 MG tablet Take 1 tablet (20 mg total) by mouth daily. 90 tablet 3   triamcinolone cream (KENALOG) 0.1 % Apply 1 Application topically as needed.     No current facility-administered medications for this visit.    PHYSICAL EXAMINATION: ECOG PERFORMANCE STATUS: 1 - Symptomatic but completely ambulatory  Vitals:   07/26/24 1559  BP:  120/67  Pulse: 75  Resp: 17  Temp: 98.1 F (36.7 C)  SpO2: 100%   Filed Weights   07/26/24 1559  Weight: 204 lb 14.4 oz (92.9 kg)      LABORATORY DATA:  I have reviewed the data as listed    Latest Ref Rng & Units 01/05/2023    9:05 AM 01/21/2021    8:32 AM 01/01/2021    2:56 PM  CMP  Glucose 70 - 99 mg/dL 895  887  94   BUN 6 - 23 mg/dL 11  9  11    Creatinine 0.40 - 1.20 mg/dL 9.30  9.24  9.23   Sodium 135 - 145 mEq/L 140  141  142   Potassium 3.5 - 5.1 mEq/L 4.2  4.3  4.2   Chloride 96 - 112 mEq/L 106  107  103   CO2 19 - 32 mEq/L 25  26  27    Calcium 8.4 - 10.5 mg/dL 8.7  8.2  8.9   Total Protein 6.0 - 8.3 g/dL 6.9   7.8   Total Bilirubin 0.2 - 1.2 mg/dL 0.4   0.3   Alkaline Phos 39 - 117 U/L 52   65   AST 0 - 37 U/L 30   25   ALT 0 - 35 U/L 35   23  Lab Results  Component Value Date   WBC 5.9 01/05/2023   HGB 13.3 01/05/2023   HCT 39.5 01/05/2023   MCV 89.2 01/05/2023   PLT 214.0 01/05/2023   NEUTROABS 4.0 01/05/2023    ASSESSMENT & PLAN:  Malignant neoplasm of upper-outer quadrant of right female breast (HCC) 01/23/2021: Right mastectomy: Grade 2 IDC, 6.3 cm, intermediate grade DCIS, margins negative, carcinoma involves the dermis of the nipple, 1 intramammary lymph node positive, 2 additional lymph nodes in the right axilla positive out of 4 ER 100%, PR 100%, HER2 negative, Ki-67 20% T3N1A stage Ib   Recommendation: 1.  MammaPrint: 02/22/2021: Low risk 2. adjuvant radiation 03/05/2021-04/17/2021 3.  Follow-up adjuvant antiestrogen therapy with tamoxifen   (premenopausal based on FSH 4.9, estradiol  46.2) x10 years 4.  Recommended abemaciclib adjuvant therapy based on [facility] E clinical trial x2 years (patient declined) ------------------------------------------------------------------------------------------------------------------------ Current treatment: Tamoxifen  started July 2022 Tamoxifen  toxicities: Weight gain: She lost 50 pounds on [employer] diet but  when she stopped the diet she has gained 15 pounds back. Mild hot flashes   Breast cancer surveillance: Breast exam 04/04/2024: Benign Mammogram 01/09/2024: Benign breast density category B   Hip pain and joint pains: Stop tamoxifen  06/27/2024: It did not improve her symptoms. Recommendation: CT CAP and bone scan will be obtained for restaging. I will call her with the results of this test.  If the results are negative then we can work with physical therapy to help improve her symptoms I will refer her to orthopedics. I recommended that we obtain guardant reveal for MRD testing.   Assessment & Plan ER-positive right breast cancer Ongoing surveillance with no recurrence. Concern about recurrence due to family history. Gardant Reveal blood test offers early detection, covered by insurance. - Order Gardant Reveal blood test every six months. - Schedule baseline scan in one to two weeks. - Review results via phone call.  Chronic hip and lower extremity joint pain Likely inflammatory. Stopping tamoxifen  did not alleviate symptoms. Normal bone density. Further evaluation needed. - Order scans to evaluate joint pain. - Refer to orthopedic specialist if scans are normal. - Refer to physical therapy for shockwave therapy. - Avoid turmeric. - Consider Tylenol  arthritis for pain management.      No orders of the defined types were placed in this encounter.  The patient has a good understanding of the overall plan. she agrees with it. she will call with any problems that may develop before the next visit here.  I personally spent a total of 45 minutes in the care of the patient today including preparing to see the patient, getting/reviewing separately obtained history, performing a medically appropriate exam/evaluation, counseling and educating, placing orders, referring and communicating with other health care professionals, documenting clinical information in the EHR, independently interpreting  results, communicating results, and coordinating care.   Viinay K Tiffancy Moger, MD 07/26/24

## 2024-07-26 NOTE — Assessment & Plan Note (Signed)
 01/23/2021: Right mastectomy: Grade 2 IDC, 6.3 cm, intermediate grade DCIS, margins negative, carcinoma involves the dermis of the nipple, 1 intramammary lymph node positive, 2 additional lymph nodes in the right axilla positive out of 4 ER 100%, PR 100%, HER2 negative, Ki-67 20% T3N1A stage Ib   Recommendation: 1.  MammaPrint: 02/22/2021: Low risk 2. adjuvant radiation 03/05/2021-04/17/2021 3.  Follow-up adjuvant antiestrogen therapy with tamoxifen   (premenopausal based on FSH 4.9, estradiol  46.2) x10 years 4.  Recommended abemaciclib adjuvant therapy based on [facility] E clinical trial x2 years (patient declined) ------------------------------------------------------------------------------------------------------------------------ Current treatment: Tamoxifen  started July 2022 Tamoxifen  toxicities: Weight gain: Phyllis Harris lost 50 pounds on Optavia diet but when Phyllis Harris stopped the diet Phyllis Harris has gained 15 pounds back. Mild hot flashes   Breast cancer surveillance: Breast exam 04/04/2024: Benign Mammogram 01/09/2024: Benign breast density category B   Hip pain and joint pains: Stop tamoxifen  06/27/2024

## 2024-07-27 ENCOUNTER — Telehealth: Payer: Self-pay | Admitting: Hematology and Oncology

## 2024-07-27 NOTE — Telephone Encounter (Signed)
 I left voicemail for patient regarding 08/21/2024 telephone visit with MD to discuss scan results.

## 2024-07-30 ENCOUNTER — Other Ambulatory Visit (HOSPITAL_COMMUNITY): Payer: Self-pay

## 2024-07-30 ENCOUNTER — Telehealth: Payer: Self-pay | Admitting: *Deleted

## 2024-07-30 ENCOUNTER — Telehealth (INDEPENDENT_AMBULATORY_CARE_PROVIDER_SITE_OTHER): Admitting: Physician Assistant

## 2024-07-30 ENCOUNTER — Telehealth: Payer: Self-pay

## 2024-07-30 VITALS — Ht 68.5 in | Wt 202.0 lb

## 2024-07-30 DIAGNOSIS — Z6830 Body mass index (BMI) 30.0-30.9, adult: Secondary | ICD-10-CM | POA: Diagnosis not present

## 2024-07-30 DIAGNOSIS — Z1211 Encounter for screening for malignant neoplasm of colon: Secondary | ICD-10-CM

## 2024-07-30 DIAGNOSIS — E669 Obesity, unspecified: Secondary | ICD-10-CM | POA: Diagnosis not present

## 2024-07-30 DIAGNOSIS — M2550 Pain in unspecified joint: Secondary | ICD-10-CM

## 2024-07-30 MED ORDER — MELOXICAM 7.5 MG PO TABS: 7.5000 mg | ORAL_TABLET | Freq: Every day | ORAL | 0 refills | Status: DC | PRN

## 2024-07-30 NOTE — Telephone Encounter (Signed)
 Noted

## 2024-07-30 NOTE — Patient Instructions (Signed)
  VISIT SUMMARY: During your visit, we discussed your joint pain, weight management, and general health maintenance. We have made some adjustments to your medication and are awaiting further test results to better understand your condition.  YOUR PLAN: JOINT PAIN (ARTHRALGIA): You have been experiencing pain in your hips, knees, and ankles, which may be related to arthritis, age-related changes, or side effects from tamoxifen . We are also considering the possibility of bone metastasis due to your cancer history. -Start taking meloxicam  7.5 mg daily. You can increase the dose to 15 mg if needed. -Take meloxicam  with food and OTC (available over the counter without a prescription) Prilosec or Pepcid  (generic is fine) to help protect your stomach while taking this  These medications work by reducing the production of stomach acid to help relieve symptoms like heartburn and can promote the healing of ulcers. I recommend taking 1 to 2-week course to see if this helps your symptoms.  -We are waiting for the results of your CT and bone scans, which are scheduled for October 31st. -If the scans do not provide clear answers, we may consider autoimmune testing and a referral to a sports medicine specialist.  WEIGHT MANAGEMENT (OBESITY): Your weight management has been challenging due to insurance issues with Wegovy , which was previously effective for you. -We will attempt to get a prescription for Wegovy  through [employer]. -If Wegovy  is not covered, we will consider using Zepbound  vials. -We will also keep an eye out for new oral weight loss medications that may become available.  GENERAL HEALTH MAINTENANCE: You are due for a routine colonoscopy screening. -We will submit a referral for your colonoscopy.                      Contains text generated by Abridge.                                 Contains text generated by Abridge.

## 2024-07-30 NOTE — Telephone Encounter (Signed)
 Please do PA for Wegovy  through pt's [employer] plan.

## 2024-07-30 NOTE — Progress Notes (Signed)
 Virtual Visit via Video Note   I, Lucie Buttner, connected with  Phyllis Harris  ([id number], 1980/11/02) on 07/30/24 at  2:00 PM EDT by a video-enabled telemedicine application and verified that I am speaking with the correct person using two identifiers.  Location: Harris: Home Provider: [facility] [place] office   I discussed the limitations of evaluation and management by telemedicine and the availability of in person appointments. The Harris expressed understanding and agreed to proceed.    Discussed the use of AI scribe software for clinical note transcription with the Harris, who gave verbal consent to proceed.  History of Present Illness   NILAYA Harris is a 43 year old female with thyroid  and breast cancer who presents with joint pain.  She experiences aching in her hips, ankles, and knees, which began while on tamoxifen  for breast cancer. Discontinuation of tamoxifen  for two weeks did not alleviate symptoms, and she has since resumed the medication. The pain is unusual and not related to changes in activity level.   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 She is concerned that her weight might contribute to the pain, especially since her insurance stopped covering Wegovy , which she was using for weight loss. She currently takes Tylenol  for pain relief but is worried about long-term use. She inquires about meloxicam , which she has used in the past for acute pain after a fall.   Her family history is significant for her mother having passed away from anaplastic thyroid  cancer with bone metastasis. She is concerned about the possibility of bone cancer, given her family history and her own history of thyroid  and breast cancer. No upper body joint pain.       Problems:  Harris Active Problem List   Diagnosis Date Noted   S/P mastectomy, right 01/23/2021   Genetic testing 01/09/2021   Family history of  thyroid  cancer 01/02/2021   History of thyroid  cancer 01/02/2021   Malignant neoplasm of upper-outer quadrant of right female breast (HCC) 12/30/2020   S/P cesarean section 05/14/2014    Allergies: No Known Allergies Medications:  Current Outpatient Medications:    citalopram (CELEXA) 10 MG tablet, Take 10 mg by mouth daily., Disp: , Rfl:    levonorgestrel  (MIRENA ) 20 MCG/24HR IUD, 1 each by Intrauterine route once. Inserted by GYN Dr. Tomblin 11/04/2017, needs to be removed in 10/2022., Disp: , Rfl:    levothyroxine  (SYNTHROID ) 112 MCG tablet, Take 224 mcg by mouth daily. Take 224 mcg daily 6 days a week. Take 112 mcg the other day of the week., Disp: , Rfl:    meloxicam  (MOBIC ) 15 MG tablet, TAKE 1 TABLET DAILY X 1 WEEK, THEN AS NEEDED AFTER THAT., Disp: 30 tablet, Rfl: 0   Multiple Vitamin (MULTIVITAMIN) capsule, Orally, Disp: , Rfl:    ondansetron  (ZOFRAN -ODT) 4 MG disintegrating tablet, Take 1 tablet (4 mg total) by mouth every 8 (eight) hours as needed for nausea or vomiting., Disp: 20 tablet, Rfl: 0   semaglutide -weight management (WEGOVY ) 1 MG/0.5ML SOAJ SQ injection, INJECT 1MG  INTO THE SKIN ONCE A WEEK, Disp: 0.5 mL, Rfl: 1   tamoxifen  (NOLVADEX ) 20 MG tablet, Take 1 tablet (20 mg total) by mouth daily., Disp: 90 tablet, Rfl: 3   triamcinolone cream (KENALOG) 0.1 %, Apply 1 Application topically as needed., Disp: , Rfl:   Observations/Objective: Harris is well-developed, well-nourished in no acute distress.  Resting comfortably  at home.  Head  is normocephalic, atraumatic.  No labored breathing.  Speech is clear and coherent with logical content.  Harris is alert and oriented at baseline.   Assessment and Plan    Arthralgia of hips, knees, and ankles Chronic arthralgia persists despite tamoxifen  discontinuation. Differential includes arthritis, age-related changes, or tamoxifen  side effects. Bone metastasis considered due to cancer history. Awaiting CT and bone scan  results. - Prescribe meloxicam  7.5 mg daily, increase to 15 mg if needed. - Advise taking meloxicam  with food and omeprazole or Pepcid . - Await CT and bone scan results on October 31st. - Consider autoimmune testing and/or sports medicine referral if scans inconclusive.  Obesity Obesity management hindered by insurance issues with Wegovy . Previous use effective but now unaffordable. Discussed future options including oral medications and Zepbound  vials. - Attempt Wegovy  prescription through [employer]. - Consider Zepbound  vials if Wegovy  not covered. - Monitor for new oral weight loss medications.  General Health Maintenance Due for routine colonoscopy screening. - Submit referral for colonoscopy.        Follow Up Instructions: I discussed the assessment and treatment plan with the Harris. The Harris was provided an opportunity to ask questions and all were answered. The Harris agreed with the plan and demonstrated an understanding of the instructions.  A copy of instructions were sent to the Harris via MyChart unless otherwise noted below.   The Harris was advised to call back or seek an in-person evaluation if the symptoms worsen or if the condition fails to improve as anticipated.  Lucie Buttner, [place]

## 2024-07-30 NOTE — Telephone Encounter (Signed)
 Per MD [employer] reveal order was emailed/scan to guardant rep.

## 2024-08-02 ENCOUNTER — Telehealth: Payer: Self-pay | Admitting: *Deleted

## 2024-08-02 ENCOUNTER — Other Ambulatory Visit (HOSPITAL_COMMUNITY): Payer: Self-pay

## 2024-08-02 NOTE — Telephone Encounter (Signed)
 Do you know if the prior authorization for Wegovy  was run thru pt's Rx plan [facility]? If not please do PA for Wegovy  1 mg. Thanks

## 2024-08-03 ENCOUNTER — Other Ambulatory Visit: Payer: Self-pay | Admitting: Physician Assistant

## 2024-08-04 ENCOUNTER — Other Ambulatory Visit: Payer: Self-pay | Admitting: Physician Assistant

## 2024-08-06 MED ORDER — WEGOVY 1 MG/0.5ML ~~LOC~~ SOAJ: 1.0000 mg | SUBCUTANEOUS | 1 refills | Status: DC

## 2024-08-06 NOTE — Addendum Note (Signed)
 Addended by: THURMON ARLAND PARAS on: 08/06/2024 07:32 AM   Modules accepted: Orders

## 2024-08-10 ENCOUNTER — Encounter (HOSPITAL_COMMUNITY)
Admission: RE | Admit: 2024-08-10 | Discharge: 2024-08-10 | Disposition: A | Discharge status: Home / Self Care | Source: Ambulatory Visit | Attending: Hematology and Oncology | Admitting: Hematology and Oncology

## 2024-08-10 ENCOUNTER — Ambulatory Visit (HOSPITAL_COMMUNITY)
Admission: RE | Admit: 2024-08-10 | Discharge: 2024-08-10 | Disposition: A | Discharge status: Home / Self Care | Source: Ambulatory Visit | Attending: Hematology and Oncology | Admitting: Hematology and Oncology

## 2024-08-10 DIAGNOSIS — C50411 Malignant neoplasm of upper-outer quadrant of right female breast: Secondary | ICD-10-CM | POA: Diagnosis present

## 2024-08-10 DIAGNOSIS — Z170 Estrogen receptor positive status [ER+]: Secondary | ICD-10-CM | POA: Insufficient documentation

## 2024-08-10 MED ORDER — TECHNETIUM TC 99M MEDRONATE IV KIT
20.0000 | PACK | Freq: Once | INTRAVENOUS | Status: AC | PRN
  Administered 2024-08-10: 21.8 via INTRAVENOUS

## 2024-08-10 MED ORDER — IOHEXOL 300 MG/ML  SOLN
100.0000 mL | Freq: Once | INTRAMUSCULAR | Status: AC | PRN
  Administered 2024-08-10: 100 mL via INTRAVENOUS

## 2024-08-14 ENCOUNTER — Encounter: Payer: Self-pay | Admitting: Hematology and Oncology

## 2024-08-21 ENCOUNTER — Inpatient Hospital Stay: Attending: Hematology and Oncology | Admitting: Hematology and Oncology

## 2024-08-21 ENCOUNTER — Encounter: Payer: Self-pay | Admitting: *Deleted

## 2024-08-21 DIAGNOSIS — C50411 Malignant neoplasm of upper-outer quadrant of right female breast: Secondary | ICD-10-CM

## 2024-08-21 DIAGNOSIS — Z170 Estrogen receptor positive status [ER+]: Secondary | ICD-10-CM

## 2024-08-21 NOTE — Assessment & Plan Note (Signed)
 01/23/2021: Right mastectomy: Grade 2 IDC, 6.3 cm, intermediate grade DCIS, margins negative, carcinoma involves the dermis of the nipple, 1 intramammary lymph node positive, 2 additional lymph nodes in the right axilla positive out of 4 ER 100%, PR 100%, HER2 negative, Ki-67 20% T3N1A stage Ib   Recommendation: 1.  MammaPrint: 02/22/2021: Low risk 2. adjuvant radiation 03/05/2021-04/17/2021 3.  Follow-up adjuvant antiestrogen therapy with tamoxifen   (premenopausal based on FSH 4.9, estradiol  46.2) x10 years 4.  Recommended abemaciclib adjuvant therapy based on [facility] E clinical trial x2 years (patient declined) ------------------------------------------------------------------------------------------------------------------------ Current treatment: Tamoxifen  started July 2022 Tamoxifen  toxicities: Weight gain: She lost 50 pounds on [employer]avia diet but when she stopped the diet she has gained 15 pounds back. Mild hot flashes   Breast cancer surveillance: Breast exam 04/04/2024: Benign Mammogram 01/09/2024: Benign breast density category B   Hip pain and joint pains: Stopped tamoxifen  06/27/2024  08/10/2024: CT CAP and bone scan: No evidence of metastatic disease  Plan: Referral to orthopedics

## 2024-08-21 NOTE — Progress Notes (Signed)
 HEMATOLOGY-ONCOLOGY TELEPHONE VISIT PROGRESS NOTE  I connected with our patient on 08/21/24 at  2:15 PM EST by telephone and verified that I am speaking with the correct person using two identifiers.  I discussed the limitations, risks, security and privacy concerns of performing an evaluation and management service by telephone and the availability of in person appointments.  I also discussed with the patient that there may be a patient responsible charge related to this service. The patient expressed understanding and agreed to proceed.   History of Present Illness: Follow-up to discuss results of scans    History of Present Illness Phyllis Harris is a 43 year old female with a history of mastectomy who presents with musculoskeletal aches and pains.  She experiences persistent musculoskeletal aches and pains since her mastectomy. Voltaren gel provides some relief for joint symptoms. She is considering magnesium supplementation for muscle tenderness and pain. She is currently taking tamoxifen .    Oncology History  Malignant neoplasm of upper-outer quadrant of right female breast (HCC)  12/30/2020 Initial Diagnosis   A right breast mass was palpated on clinical exam. Diagnostic mammogram and US  showed calcifications in the upper outer and upper inner right breast, 9.7cm, and multiple masses from the 9-2 o'clock position, 1.8cm, 3.3cm, 1.1cm, 2.8cm, 0.8cm, and 2 abnormal lymph nodes in the right axilla. Biopsy showed invasive mammary carcinoma in the breast and axilla, grade 2. ER 100%, PR 100%, Ki-67 20%, HER-2 negative   01/01/2021 Cancer Staging   Staging form: Breast, AJCC 8th Edition - Clinical stage from 01/01/2021: Stage IIA (cT3, cN1, cM0, G2, ER+, PR+, HER2-) - Signed by Odean Potts, MD on 01/01/2021 Stage prefix: Initial diagnosis Histologic grading system: 3 grade system   01/08/2021 Genetic Testing   Negative hereditary cancer genetic testing: no pathogenic variants detected in  [facility] Multi-Cancer Panel.  The report date is January 08, 2021.    The Multi-Gene Panel offered by [facility] includes sequencing and/or deletion duplication testing of the following 84 genes: AIP, ALK, APC, ATM, AXIN2,BAP1,  BARD1, BLM, BMPR1A, BRCA1, BRCA2, BRIP1, CASR, CDC73, CDH1, CDK4, CDKN1B, CDKN1C, CDKN2A (p14ARF), CDKN2A (p16INK4a), CEBPA, CHEK2, CTNNA1, DICER1, DIS3L2, EGFR (c.2369C>T, p.Thr790Met variant only), EPCAM (Deletion/duplication testing only), FH, FLCN, GATA2, GPC3, GREM1 (Promoter region deletion/duplication testing only), HOXB13 (c.251G>A, p.Gly84Glu), HRAS, KIT, MAX, MEN1, MET, MITF (c.952G>A, p.Glu318Lys variant only), MLH1, MSH2, MSH3, MSH6, MUTYH, NBN, NF1, NF2, NTHL1, PALB2, PDGFRA, PHOX2B, PMS2, POLD1, POLE, POT1, PRKAR1A, PTCH1, PTEN, RAD50, RAD51C, RAD51D, RB1, RECQL4, RET, RUNX1, SDHAF2, SDHA (sequence changes only), SDHB, SDHC, SDHD, SMAD4, SMARCA4, SMARCB1, SMARCE1, STK11, SUFU, TERC, TERT, TMEM127, TP53, TSC1, TSC2, VHL, WRN and WT1.    01/23/2021 Surgery   Right mastectomy: Grade 2 IDC, 6.3 cm, intermediate grade DCIS, margins negative, carcinoma involves the dermis of the nipple, 1 intramammary lymph node positive, 2 additional lymph nodes in the right axilla positive out of 4 ER 100%, PR 100%, HER2 negative, Ki-67 20% T3N1A   02/02/2021 Cancer Staging   Staging form: Breast, AJCC 8th Edition - Pathologic stage from 02/02/2021: Stage IB (pT3, pN1a, cM0, G2, ER+, PR+, HER2-) - Signed by Odean Potts, MD on 02/02/2021 Histologic grading system: 3 grade system   03/04/2021 - 04/17/2021 Radiation Therapy   Site Technique Total Dose (Gy) Dose per Fx (Gy) Completed Fx Beam Energies  Chest Wall, Right: CW_Rt 3D 50/50 2 25/25 10X  Sclav-RT: SCV_Rt 3D 50/50 2 25/25 6X, 10X  Chest Wall, Right: CW_Rt_Bst Electron 10/10 2 5/5 6E  05/2021 -  Anti-estrogen oral therapy   Tamoxifen  daily     REVIEW OF SYSTEMS:   Constitutional: Denies fevers, chills or abnormal weight  loss All other systems were reviewed with the patient and are negative. Observations/Objective:     Assessment Plan:  Malignant neoplasm of upper-outer quadrant of right female breast (HCC) 01/23/2021: Right mastectomy: Grade 2 IDC, 6.3 cm, intermediate grade DCIS, margins negative, carcinoma involves the dermis of the nipple, 1 intramammary lymph node positive, 2 additional lymph nodes in the right axilla positive out of 4 ER 100%, PR 100%, HER2 negative, Ki-67 20% T3N1A stage Ib   Recommendation: 1.  MammaPrint: 02/22/2021: Low risk 2. adjuvant radiation 03/05/2021-04/17/2021 3.  Follow-up adjuvant antiestrogen therapy with tamoxifen   (premenopausal based on FSH 4.9, estradiol  46.2) x10 years 4.  Recommended abemaciclib adjuvant therapy based on [facility] E clinical trial x2 years (patient declined) ------------------------------------------------------------------------------------------------------------------------ Current treatment: Tamoxifen  started July 2022 Tamoxifen  toxicities: Weight gain: She lost 50 pounds on [employer] diet but when she stopped the diet she has gained 15 pounds back. Mild hot flashes   Breast cancer surveillance: Breast exam 04/04/2024: Benign Mammogram 01/09/2024: Benign breast density category B   Hip pain and joint pains: Stopped tamoxifen  06/27/2024  08/10/2024: CT CAP and bone scan: No evidence of metastatic disease  Plan: Referral to physical therapy for musculoskeletal aches and pains. We discussed that she could consider taking magnesium supplementation for the muscle aches and pains and Voltaren gel for the joint symptoms.  If all else fails then we will refer her to orthopedics.    I discussed the assessment and treatment plan with the patient. The patient was provided an opportunity to ask questions and all were answered. The patient agreed with the plan and demonstrated an understanding of the instructions. The patient was advised to call back or seek an  in-person evaluation if the symptoms worsen or if the condition fails to improve as anticipated.   I provided 20 minutes of non-face-to-face time during this encounter.  This includes time for charting and coordination of care   Naomi MARLA Chad, MD

## 2024-08-22 ENCOUNTER — Encounter: Payer: Self-pay | Admitting: Hematology and Oncology

## 2024-09-04 ENCOUNTER — Ambulatory Visit: Admitting: Rehabilitation

## 2024-09-07 ENCOUNTER — Other Ambulatory Visit: Payer: Self-pay | Admitting: Physician Assistant

## 2024-10-20 ENCOUNTER — Encounter: Payer: Self-pay | Admitting: Physician Assistant

## 2024-10-30 ENCOUNTER — Telehealth (INDEPENDENT_AMBULATORY_CARE_PROVIDER_SITE_OTHER): Admitting: Physician Assistant

## 2024-10-30 ENCOUNTER — Encounter: Payer: Self-pay | Admitting: Physician Assistant

## 2024-10-30 VITALS — Ht 68.5 in | Wt 190.0 lb

## 2024-10-30 DIAGNOSIS — J0100 Acute maxillary sinusitis, unspecified: Secondary | ICD-10-CM | POA: Diagnosis not present

## 2024-10-30 DIAGNOSIS — E663 Overweight: Secondary | ICD-10-CM

## 2024-10-30 MED ORDER — AMOXICILLIN-POT CLAVULANATE 875-125 MG PO TABS: 1.0000 | ORAL_TABLET | Freq: Two times a day (BID) | ORAL | 0 refills | Status: AC

## 2024-10-30 MED ORDER — WEGOVY 1 MG/0.5ML ~~LOC~~ SOAJ: 1.0000 mg | SUBCUTANEOUS | 1 refills | Status: AC

## 2024-10-30 NOTE — Progress Notes (Signed)
 "   Virtual Visit via Video Note   I, Lucie Buttner, connected with  Phyllis Harris  ([id number], [birth year]) on 10/30/24 at  1:20 PM EST by a video-enabled telemedicine application and verified that I am speaking with the correct person using two identifiers.  Location: Patient: Home Provider: [facility] [place] [facility]   I discussed the limitations of evaluation and management by telemedicine and the availability of in person appointments. The patient expressed understanding and agreed to proceed.    Discussed the use of AI scribe software for clinical note transcription with the patient, who gave verbal consent to proceed.  History of Present Illness   Phyllis Harris is a 44 year old female who presents with sinus pressure and weight management concerns.  She has had sinus pressure for 2 weeks, localized to the sinuses without chest symptoms or fever. Tylenol  Sinus, Zyrtec, and Claritin have not relieved her symptoms.  She uses weekly Wegovy  1 mg loss injections and has lost significant weight to 190 lb, with improved overall well-being. She has occasional nausea controlled with Zofran . She had thyroid  removal and is in menopause. Her goal weight is 170 lb. Her current weight loss medication is no longer covered by insurance and now costs $350 per month, which concerns her despite its benefit in reducing food intake.     She is exercising regularly and eating a well balanced diet.  Problems:  Patient Active Problem List   Diagnosis Date Noted   S/P mastectomy, right 01/23/2021   Genetic testing 01/09/2021   Family history of thyroid  cancer 01/02/2021   History of thyroid  cancer 01/02/2021   Malignant neoplasm of upper-outer quadrant of right female breast (HCC) 12/30/2020   S/P cesarean section 05/14/2014    Allergies: Allergies[1] Medications: Current Medications[2]  Observations/Objective: Patient is well-developed, well-nourished in no acute distress.  Resting  comfortably  at home.  Head is normocephalic, atraumatic.  No labored breathing.  Speech is clear and coherent with logical content.  Patient is alert and oriented at baseline.   Assessment and Plan    Acute sinusitis Sinus pressure persists despite OTC medications. - Start oral Augmentin  - follow up if new/worsening/lack of improvement  Obesity Significant weight loss with current medication. Occasional nausea managed. Insurance coverage issues discussed. - Continue current Wegovy  1 mg weekly - Submitted prior authorization for insurance coverage. - Monitor weight and adjust treatment as needed; consider pivoting to oral medication once weight goal has been achieved        Follow Up Instructions: I discussed the assessment and treatment plan with the patient. The patient was provided an opportunity to ask questions and all were answered. The patient agreed with the plan and demonstrated an understanding of the instructions.  A copy of instructions were sent to the patient via MyChart unless otherwise noted below.   The patient was advised to call back or seek an in-person evaluation if the symptoms worsen or if the condition fails to improve as anticipated.  Lucie Buttner, PA    [1] No Known Allergies [2]  Current Outpatient Medications:    amoxicillin -clavulanate (AUGMENTIN ) 875-125 MG tablet, Take 1 tablet by mouth 2 (two) times daily., Disp: 14 tablet, Rfl: 0   citalopram (CELEXA) 10 MG tablet, Take 10 mg by mouth daily., Disp: , Rfl:    levonorgestrel  (MIRENA ) 20 MCG/24HR IUD, 1 each by Intrauterine route once. Inserted by GYN Dr. Tomblin 11/04/2017, needs to be removed in 10/2022., Disp: , Rfl:  levothyroxine  (SYNTHROID ) 112 MCG tablet, Take 224 mcg by mouth daily. Take 224 mcg daily 6 days a week. Take 112 mcg the other day of the week., Disp: , Rfl:    meloxicam  (MOBIC ) 7.5 MG tablet, TAKE 1-2 TABLETS (7.5-15 MG TOTAL) BY MOUTH DAILY AS NEEDED FOR PAIN., Disp: 60  tablet, Rfl: 0   Multiple Vitamin (MULTIVITAMIN) capsule, Orally, Disp: , Rfl:    ondansetron  (ZOFRAN -ODT) 4 MG disintegrating tablet, TAKE 1 TABLET BY MOUTH EVERY 8 HOURS AS NEEDED FOR NAUSEA AND VOMITING, Disp: 20 tablet, Rfl: 0   tamoxifen  (NOLVADEX ) 20 MG tablet, Take 1 tablet (20 mg total) by mouth daily., Disp: 90 tablet, Rfl: 3   triamcinolone cream (KENALOG) 0.1 %, Apply 1 Application topically as needed., Disp: , Rfl:    semaglutide -weight management (WEGOVY ) 1 MG/0.5ML SOAJ SQ injection, Inject 1 mg into the skin once a week., Disp: 2 mL, Rfl: 1  "

## 2024-10-30 NOTE — Patient Instructions (Addendum)
 Phyllis Harris

## 2024-10-31 ENCOUNTER — Encounter: Payer: Self-pay | Admitting: Physician Assistant

## 2024-10-31 MED ORDER — FLUCONAZOLE 150 MG PO TABS: 150.0000 mg | ORAL_TABLET | Freq: Once | ORAL | 0 refills | Status: AC

## 2024-10-31 NOTE — Telephone Encounter (Signed)
 Pt requesting Diflucan  on antibiotics. Okay to fill?

## 2024-11-14 ENCOUNTER — Other Ambulatory Visit (HOSPITAL_COMMUNITY): Payer: Self-pay

## 2024-11-14 ENCOUNTER — Telehealth: Payer: Self-pay

## 2024-11-14 NOTE — Telephone Encounter (Addendum)
 Pharmacy Patient Advocate Encounter   Received notification from CoverMyMeds that prior authorization for Wegovy  1mg /0.36ml is required/requested.   Insurance verification completed.   The patient is insured through [employer] and [employer].   Per test claim: PA required; PA submitted to above mentioned insurance via Latent Key/confirmation #/EOC [id number] Status is pending   Will submit a PA to [facility] after we receive a determination letter from the primary insurance.

## 2024-11-14 NOTE — Telephone Encounter (Signed)
 Pharmacy Patient Advocate Encounter  Received notification from [facility] that Prior Authorization for Wegovy  1mg /0.90ml has been DENIED.  See denial reason below. No denial letter attached in CMM. Will attach denial letter to Media tab once received.   PA #/Case ID/Reference #: [id number]

## 2025-04-04 ENCOUNTER — Ambulatory Visit: Admitting: Hematology and Oncology
